# Patient Record
Sex: Female | Born: 1955
Health system: Southern US, Community
[De-identification: ages and names within clinical notes are randomized; demographics above are authoritative.]

## PROBLEM LIST (undated history)

## (undated) DIAGNOSIS — M25511 Pain in right shoulder: Secondary | ICD-10-CM

## (undated) DIAGNOSIS — R233 Spontaneous ecchymoses: Secondary | ICD-10-CM

## (undated) DIAGNOSIS — M7551 Bursitis of right shoulder: Secondary | ICD-10-CM

## (undated) DIAGNOSIS — M549 Dorsalgia, unspecified: Secondary | ICD-10-CM

## (undated) DIAGNOSIS — R635 Abnormal weight gain: Secondary | ICD-10-CM

## (undated) DIAGNOSIS — Z8639 Personal history of other endocrine, nutritional and metabolic disease: Secondary | ICD-10-CM

## (undated) DIAGNOSIS — R001 Bradycardia, unspecified: Secondary | ICD-10-CM

## (undated) DIAGNOSIS — K219 Gastro-esophageal reflux disease without esophagitis: Secondary | ICD-10-CM

## (undated) DIAGNOSIS — F329 Major depressive disorder, single episode, unspecified: Secondary | ICD-10-CM

## (undated) DIAGNOSIS — R5383 Other fatigue: Secondary | ICD-10-CM

## (undated) DIAGNOSIS — R1030 Lower abdominal pain, unspecified: Secondary | ICD-10-CM

## (undated) DIAGNOSIS — F17200 Nicotine dependence, unspecified, uncomplicated: Secondary | ICD-10-CM

## (undated) DIAGNOSIS — D229 Melanocytic nevi, unspecified: Secondary | ICD-10-CM

## (undated) HISTORY — DX: Melanocytic nevi, unspecified: D22.9

## (undated) HISTORY — DX: Other fatigue: R53.83

## (undated) HISTORY — DX: Bursitis of right shoulder: M75.51

## (undated) HISTORY — DX: Bradycardia, unspecified: R00.1

## (undated) HISTORY — DX: Spontaneous ecchymoses: R23.3

## (undated) HISTORY — DX: Gastro-esophageal reflux disease without esophagitis: K21.9

## (undated) HISTORY — DX: Lower abdominal pain, unspecified: R10.30

## (undated) HISTORY — DX: Nicotine dependence, unspecified, uncomplicated: F17.200

## (undated) HISTORY — DX: Dorsalgia, unspecified: M54.9

## (undated) HISTORY — DX: Pain in right shoulder: M25.511

## (undated) HISTORY — DX: Personal history of other endocrine, nutritional and metabolic disease: Z86.39

## (undated) HISTORY — DX: Major depressive disorder, single episode, unspecified: F32.9

## (undated) HISTORY — DX: Abnormal weight gain: R63.5

## (undated) HISTORY — PX: ABDOMINAL HYSTERECTOMY: SHX81

## (undated) HISTORY — PX: CHOLECYSTECTOMY: SHX55

---

## 2000-07-20 ENCOUNTER — Encounter: Payer: Self-pay | Admitting: Family Medicine

## 2000-07-20 ENCOUNTER — Ambulatory Visit (HOSPITAL_COMMUNITY): Admission: RE | Admit: 2000-07-20 | Discharge: 2000-07-20 | Payer: Self-pay | Admitting: Family Medicine

## 2002-08-06 ENCOUNTER — Emergency Department (HOSPITAL_COMMUNITY): Admission: EM | Admit: 2002-08-06 | Discharge: 2002-08-06 | Payer: Self-pay | Admitting: Emergency Medicine

## 2002-08-07 ENCOUNTER — Emergency Department (HOSPITAL_COMMUNITY): Admission: EM | Admit: 2002-08-07 | Discharge: 2002-08-07 | Payer: Self-pay

## 2003-09-11 ENCOUNTER — Encounter: Admission: RE | Admit: 2003-09-11 | Discharge: 2003-09-11 | Payer: Self-pay | Admitting: Obstetrics and Gynecology

## 2003-10-10 ENCOUNTER — Ambulatory Visit (HOSPITAL_COMMUNITY): Admission: RE | Admit: 2003-10-10 | Discharge: 2003-10-10 | Payer: Self-pay | Admitting: Obstetrics and Gynecology

## 2004-07-01 ENCOUNTER — Ambulatory Visit: Payer: Self-pay | Admitting: Obstetrics and Gynecology

## 2004-07-09 ENCOUNTER — Ambulatory Visit: Payer: Self-pay | Admitting: Internal Medicine

## 2004-08-04 ENCOUNTER — Emergency Department (HOSPITAL_COMMUNITY): Admission: EM | Admit: 2004-08-04 | Discharge: 2004-08-04 | Payer: Self-pay | Admitting: Emergency Medicine

## 2004-12-31 ENCOUNTER — Ambulatory Visit: Payer: Self-pay | Admitting: Internal Medicine

## 2004-12-31 ENCOUNTER — Inpatient Hospital Stay (HOSPITAL_COMMUNITY): Admission: AD | Admit: 2004-12-31 | Discharge: 2005-01-02 | Payer: Self-pay | Admitting: Internal Medicine

## 2005-02-22 ENCOUNTER — Ambulatory Visit: Payer: Self-pay | Admitting: Internal Medicine

## 2005-06-09 ENCOUNTER — Ambulatory Visit: Payer: Self-pay | Admitting: Internal Medicine

## 2005-06-27 ENCOUNTER — Ambulatory Visit: Payer: Self-pay | Admitting: Internal Medicine

## 2005-07-07 ENCOUNTER — Ambulatory Visit (HOSPITAL_COMMUNITY): Admission: RE | Admit: 2005-07-07 | Discharge: 2005-07-07 | Payer: Self-pay | Admitting: Internal Medicine

## 2005-07-07 ENCOUNTER — Encounter (INDEPENDENT_AMBULATORY_CARE_PROVIDER_SITE_OTHER): Payer: Self-pay | Admitting: *Deleted

## 2005-07-22 ENCOUNTER — Ambulatory Visit (HOSPITAL_COMMUNITY): Admission: RE | Admit: 2005-07-22 | Discharge: 2005-07-22 | Payer: Self-pay | Admitting: Family Medicine

## 2005-07-22 ENCOUNTER — Encounter (INDEPENDENT_AMBULATORY_CARE_PROVIDER_SITE_OTHER): Payer: Self-pay | Admitting: *Deleted

## 2005-08-08 ENCOUNTER — Ambulatory Visit (HOSPITAL_COMMUNITY): Admission: RE | Admit: 2005-08-08 | Discharge: 2005-08-08 | Payer: Self-pay | Admitting: *Deleted

## 2005-11-24 ENCOUNTER — Ambulatory Visit: Payer: Self-pay | Admitting: Internal Medicine

## 2005-12-01 ENCOUNTER — Encounter (INDEPENDENT_AMBULATORY_CARE_PROVIDER_SITE_OTHER): Payer: Self-pay | Admitting: *Deleted

## 2005-12-01 ENCOUNTER — Ambulatory Visit: Payer: Self-pay | Admitting: Internal Medicine

## 2005-12-08 ENCOUNTER — Ambulatory Visit: Payer: Self-pay | Admitting: Internal Medicine

## 2006-05-15 DIAGNOSIS — F32A Depression, unspecified: Secondary | ICD-10-CM

## 2006-05-15 DIAGNOSIS — Z8639 Personal history of other endocrine, nutritional and metabolic disease: Secondary | ICD-10-CM

## 2006-05-15 DIAGNOSIS — K219 Gastro-esophageal reflux disease without esophagitis: Secondary | ICD-10-CM

## 2006-05-15 DIAGNOSIS — I498 Other specified cardiac arrhythmias: Secondary | ICD-10-CM | POA: Insufficient documentation

## 2006-05-15 DIAGNOSIS — F329 Major depressive disorder, single episode, unspecified: Secondary | ICD-10-CM

## 2006-05-15 DIAGNOSIS — R001 Bradycardia, unspecified: Secondary | ICD-10-CM

## 2006-05-15 DIAGNOSIS — E049 Nontoxic goiter, unspecified: Secondary | ICD-10-CM | POA: Insufficient documentation

## 2006-05-15 HISTORY — DX: Bradycardia, unspecified: R00.1

## 2006-05-15 HISTORY — DX: Depression, unspecified: F32.A

## 2006-05-15 HISTORY — DX: Gastro-esophageal reflux disease without esophagitis: K21.9

## 2006-05-15 HISTORY — DX: Personal history of other endocrine, nutritional and metabolic disease: Z86.39

## 2006-05-18 ENCOUNTER — Encounter (INDEPENDENT_AMBULATORY_CARE_PROVIDER_SITE_OTHER): Payer: Self-pay | Admitting: *Deleted

## 2006-06-06 ENCOUNTER — Ambulatory Visit: Payer: Self-pay | Admitting: Internal Medicine

## 2006-06-06 ENCOUNTER — Encounter (INDEPENDENT_AMBULATORY_CARE_PROVIDER_SITE_OTHER): Payer: Self-pay | Admitting: *Deleted

## 2006-06-06 LAB — CONVERTED CEMR LAB
ALT: 9 units/L (ref 0–35)
AST: 13 units/L (ref 0–37)
Albumin: 4 g/dL (ref 3.5–5.2)
BUN: 4 mg/dL — ABNORMAL LOW (ref 6–23)
Calcium: 8.5 mg/dL (ref 8.4–10.5)
Cholesterol: 171 mg/dL (ref 0–200)
Glucose, Bld: 81 mg/dL (ref 70–99)
TSH: 1.268 microintl units/mL (ref 0.350–5.50)
Total Bilirubin: 0.5 mg/dL (ref 0.3–1.2)
Total Protein: 6.9 g/dL (ref 6.0–8.3)
Triglycerides: 155 mg/dL — ABNORMAL HIGH (ref <150)

## 2006-09-06 ENCOUNTER — Encounter (INDEPENDENT_AMBULATORY_CARE_PROVIDER_SITE_OTHER): Payer: Self-pay | Admitting: *Deleted

## 2006-09-06 ENCOUNTER — Ambulatory Visit: Payer: Self-pay | Admitting: Internal Medicine

## 2006-09-13 ENCOUNTER — Ambulatory Visit (HOSPITAL_COMMUNITY): Admission: RE | Admit: 2006-09-13 | Discharge: 2006-09-13 | Payer: Self-pay | Admitting: Family Medicine

## 2006-09-13 ENCOUNTER — Encounter: Payer: Self-pay | Admitting: Internal Medicine

## 2006-10-27 ENCOUNTER — Encounter (INDEPENDENT_AMBULATORY_CARE_PROVIDER_SITE_OTHER): Payer: Self-pay | Admitting: *Deleted

## 2006-11-29 ENCOUNTER — Encounter (INDEPENDENT_AMBULATORY_CARE_PROVIDER_SITE_OTHER): Payer: Self-pay | Admitting: *Deleted

## 2006-12-06 ENCOUNTER — Ambulatory Visit: Payer: Self-pay | Admitting: Internal Medicine

## 2006-12-06 ENCOUNTER — Telehealth (INDEPENDENT_AMBULATORY_CARE_PROVIDER_SITE_OTHER): Payer: Self-pay | Admitting: *Deleted

## 2006-12-15 ENCOUNTER — Encounter: Admission: RE | Admit: 2006-12-15 | Discharge: 2007-01-09 | Payer: Self-pay | Admitting: *Deleted

## 2006-12-29 ENCOUNTER — Encounter (INDEPENDENT_AMBULATORY_CARE_PROVIDER_SITE_OTHER): Payer: Self-pay | Admitting: *Deleted

## 2007-06-06 ENCOUNTER — Ambulatory Visit: Payer: Self-pay | Admitting: Internal Medicine

## 2007-06-08 ENCOUNTER — Emergency Department (HOSPITAL_COMMUNITY): Admission: EM | Admit: 2007-06-08 | Discharge: 2007-06-08 | Payer: Self-pay | Admitting: Emergency Medicine

## 2007-06-14 ENCOUNTER — Ambulatory Visit: Payer: Self-pay | Admitting: Internal Medicine

## 2007-08-23 ENCOUNTER — Ambulatory Visit: Payer: Self-pay | Admitting: Infectious Disease

## 2007-08-23 ENCOUNTER — Encounter (INDEPENDENT_AMBULATORY_CARE_PROVIDER_SITE_OTHER): Payer: Self-pay | Admitting: Internal Medicine

## 2007-08-24 LAB — CONVERTED CEMR LAB
Albumin: 3.9 g/dL (ref 3.5–5.2)
CO2: 29 meq/L (ref 19–32)
Calcium: 9.1 mg/dL (ref 8.4–10.5)
Cholesterol: 304 mg/dL — ABNORMAL HIGH (ref 0–200)
Creatinine, Ser: 0.77 mg/dL (ref 0.40–1.20)
HDL: 53 mg/dL (ref >39)
LDL Cholesterol: 218 mg/dL — ABNORMAL HIGH (ref 0–99)
Sodium: 142 meq/L (ref 135–145)
TSH: 1.448 microintl units/mL (ref 0.350–5.50)
Total CHOL/HDL Ratio: 5.7
Triglycerides: 166 mg/dL — ABNORMAL HIGH (ref <150)
VLDL: 33 mg/dL (ref 0–40)

## 2007-08-28 ENCOUNTER — Telehealth: Payer: Self-pay | Admitting: *Deleted

## 2008-03-26 ENCOUNTER — Ambulatory Visit: Payer: Self-pay | Admitting: Internal Medicine

## 2008-03-26 ENCOUNTER — Encounter (INDEPENDENT_AMBULATORY_CARE_PROVIDER_SITE_OTHER): Payer: Self-pay | Admitting: Internal Medicine

## 2008-05-09 ENCOUNTER — Ambulatory Visit: Payer: Self-pay | Admitting: Internal Medicine

## 2008-05-13 ENCOUNTER — Emergency Department (HOSPITAL_COMMUNITY): Admission: EM | Admit: 2008-05-13 | Discharge: 2008-05-13 | Payer: Self-pay | Admitting: Emergency Medicine

## 2008-05-14 ENCOUNTER — Telehealth (INDEPENDENT_AMBULATORY_CARE_PROVIDER_SITE_OTHER): Payer: Self-pay | Admitting: Pharmacy Technician

## 2008-05-15 ENCOUNTER — Encounter: Admission: RE | Admit: 2008-05-15 | Discharge: 2008-05-15 | Payer: Self-pay | Admitting: Chiropractic Medicine

## 2008-08-11 ENCOUNTER — Encounter (INDEPENDENT_AMBULATORY_CARE_PROVIDER_SITE_OTHER): Payer: Self-pay | Admitting: Internal Medicine

## 2008-08-11 ENCOUNTER — Ambulatory Visit: Payer: Self-pay | Admitting: Internal Medicine

## 2008-08-13 LAB — CONVERTED CEMR LAB
ALT: 9 units/L (ref 0–35)
AST: 11 units/L (ref 0–37)
Albumin: 3.7 g/dL (ref 3.5–5.2)
BUN: 9 mg/dL (ref 6–23)
CO2: 23 meq/L (ref 19–32)
Calcium: 8.6 mg/dL (ref 8.4–10.5)
Chloride: 108 meq/L (ref 96–112)
Creatinine, Ser: 0.91 mg/dL (ref 0.40–1.20)
HDL: 57 mg/dL (ref >39)
Hemoglobin: 13.3 g/dL (ref 12.0–15.0)
MCHC: 32.8 g/dL (ref 30.0–36.0)
Potassium: 3.8 meq/L (ref 3.5–5.3)
RBC: 4.33 M/uL (ref 3.87–5.11)
Total Protein: 6.3 g/dL (ref 6.0–8.3)
WBC: 3.8 10*3/uL — ABNORMAL LOW (ref 4.0–10.5)

## 2008-08-15 ENCOUNTER — Encounter (INDEPENDENT_AMBULATORY_CARE_PROVIDER_SITE_OTHER): Payer: Self-pay | Admitting: *Deleted

## 2008-10-08 ENCOUNTER — Telehealth (INDEPENDENT_AMBULATORY_CARE_PROVIDER_SITE_OTHER): Payer: Self-pay | Admitting: Internal Medicine

## 2009-04-20 ENCOUNTER — Ambulatory Visit: Payer: Self-pay | Admitting: Infectious Diseases

## 2009-07-07 ENCOUNTER — Ambulatory Visit: Payer: Self-pay | Admitting: Internal Medicine

## 2009-07-07 DIAGNOSIS — D239 Other benign neoplasm of skin, unspecified: Secondary | ICD-10-CM | POA: Insufficient documentation

## 2009-07-07 DIAGNOSIS — M549 Dorsalgia, unspecified: Secondary | ICD-10-CM

## 2009-07-07 DIAGNOSIS — M25519 Pain in unspecified shoulder: Secondary | ICD-10-CM

## 2009-07-07 DIAGNOSIS — D229 Melanocytic nevi, unspecified: Secondary | ICD-10-CM

## 2009-07-07 DIAGNOSIS — M25511 Pain in right shoulder: Secondary | ICD-10-CM

## 2009-07-07 HISTORY — DX: Melanocytic nevi, unspecified: D22.9

## 2009-07-07 HISTORY — DX: Pain in right shoulder: M25.511

## 2009-07-07 HISTORY — DX: Dorsalgia, unspecified: M54.9

## 2009-07-27 ENCOUNTER — Telehealth: Payer: Self-pay | Admitting: Internal Medicine

## 2009-08-17 ENCOUNTER — Telehealth: Payer: Self-pay | Admitting: Internal Medicine

## 2009-10-19 ENCOUNTER — Encounter: Payer: Self-pay | Admitting: Internal Medicine

## 2009-11-17 IMAGING — CR DG WRIST COMPLETE 3+V*R*
4 series · 4 of 4 positions shown · non-contrast
Comparison: None.

CLINICAL DATA: Right thumb pain, right wrist pain

RIGHT WRIST - COMPLETE 3+ VIEW

[x wrist pa right]
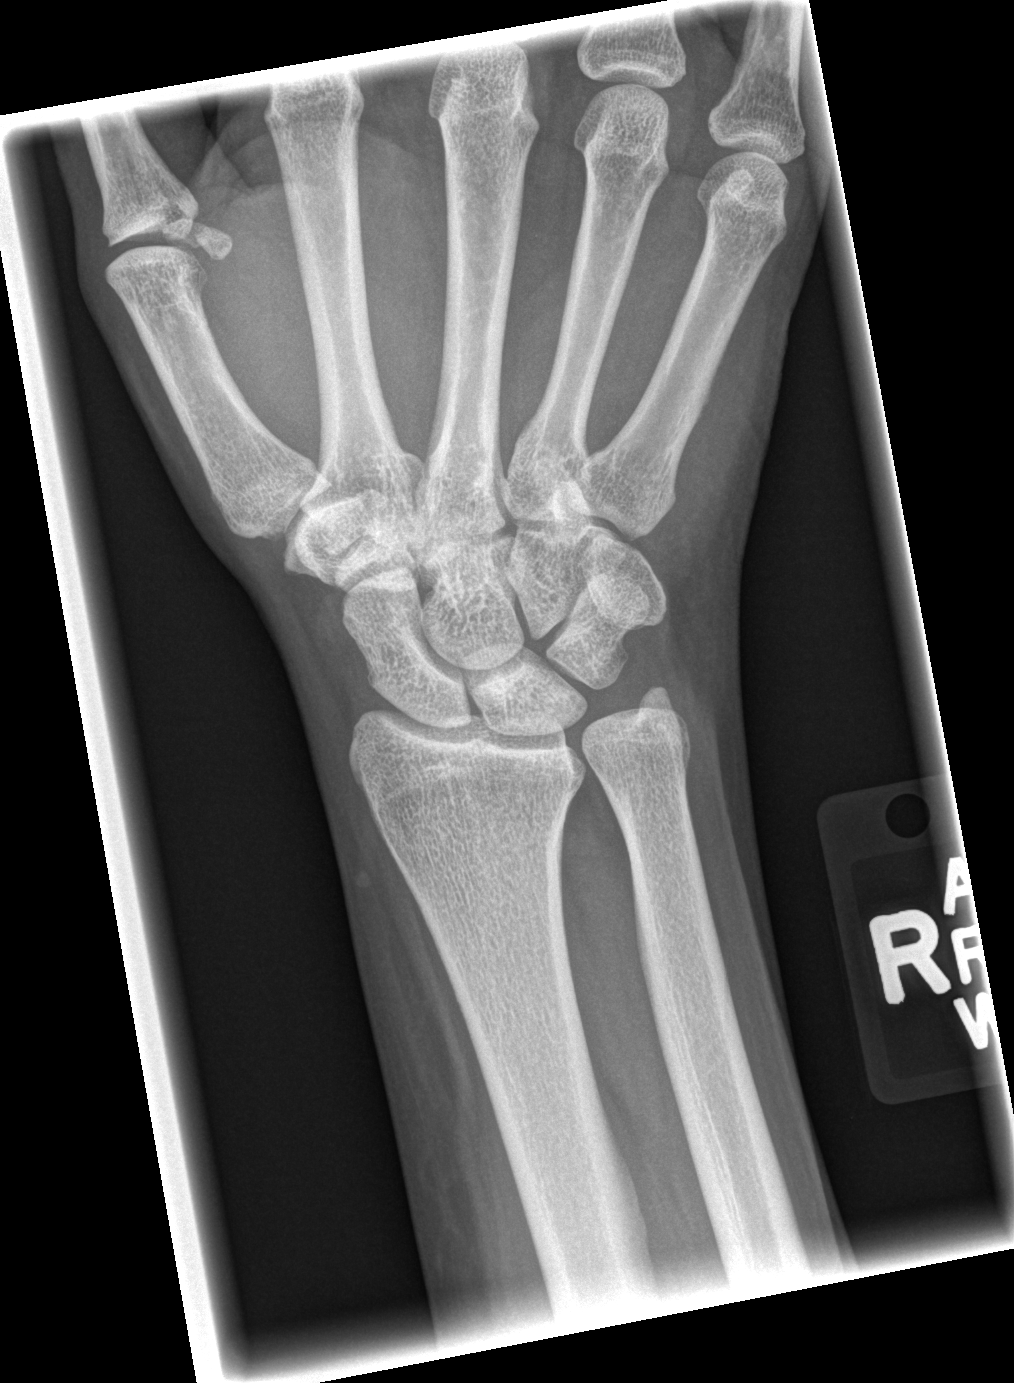

[x wrist obl right]
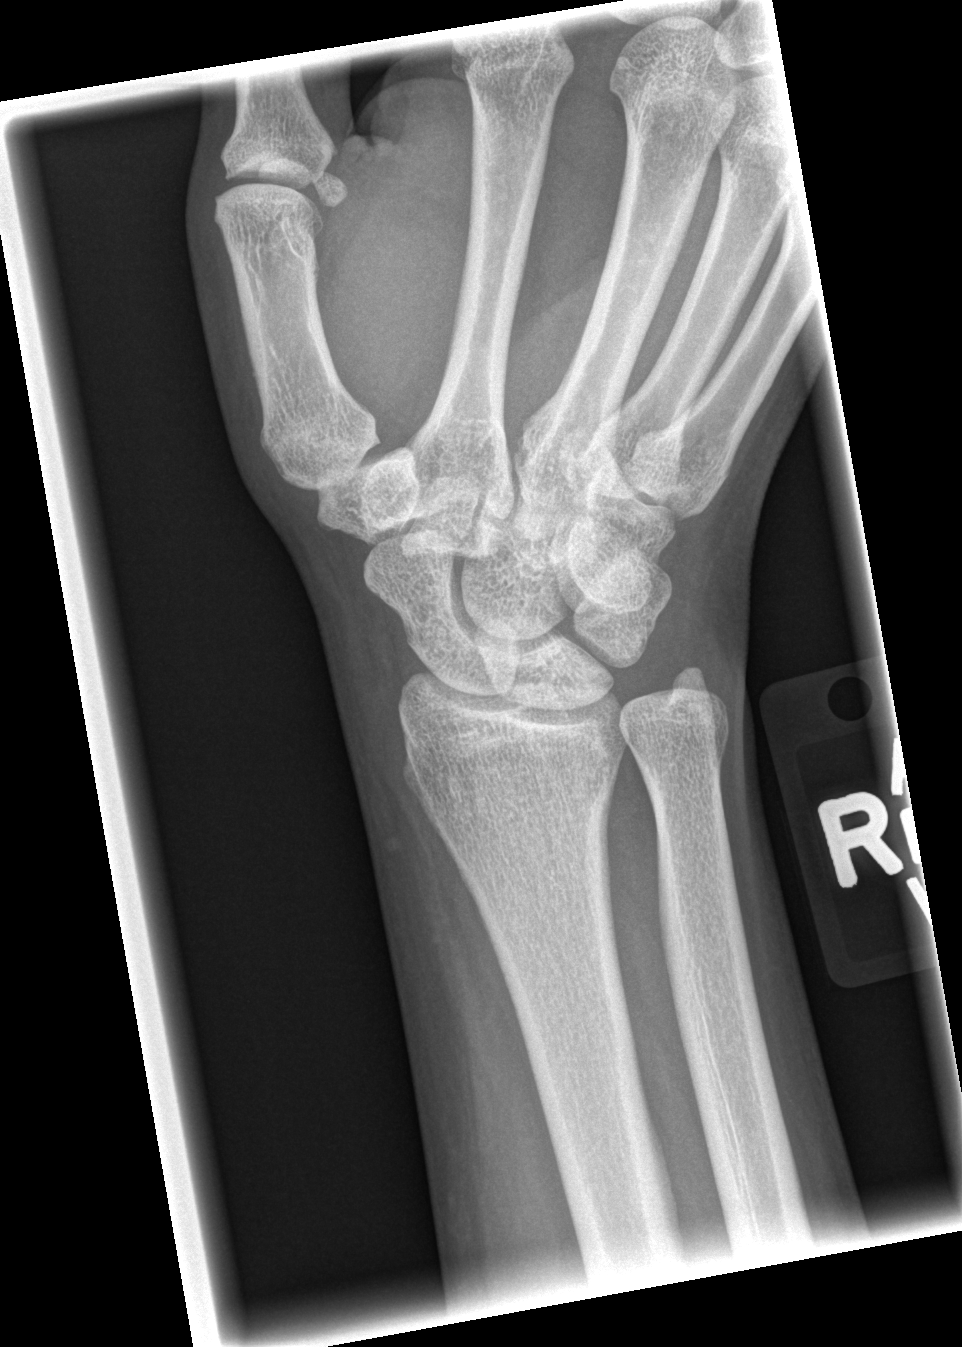

[x wrist lat right]
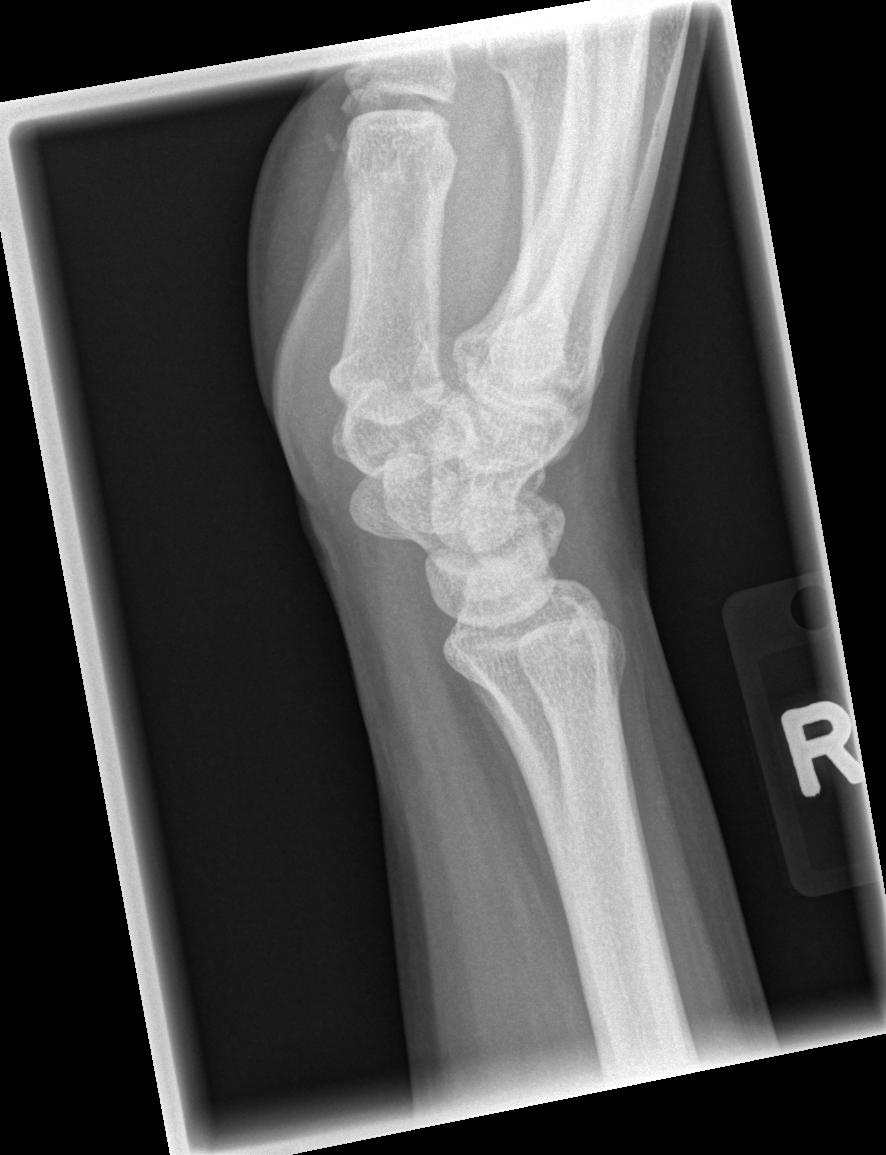

[x wrist navicular]
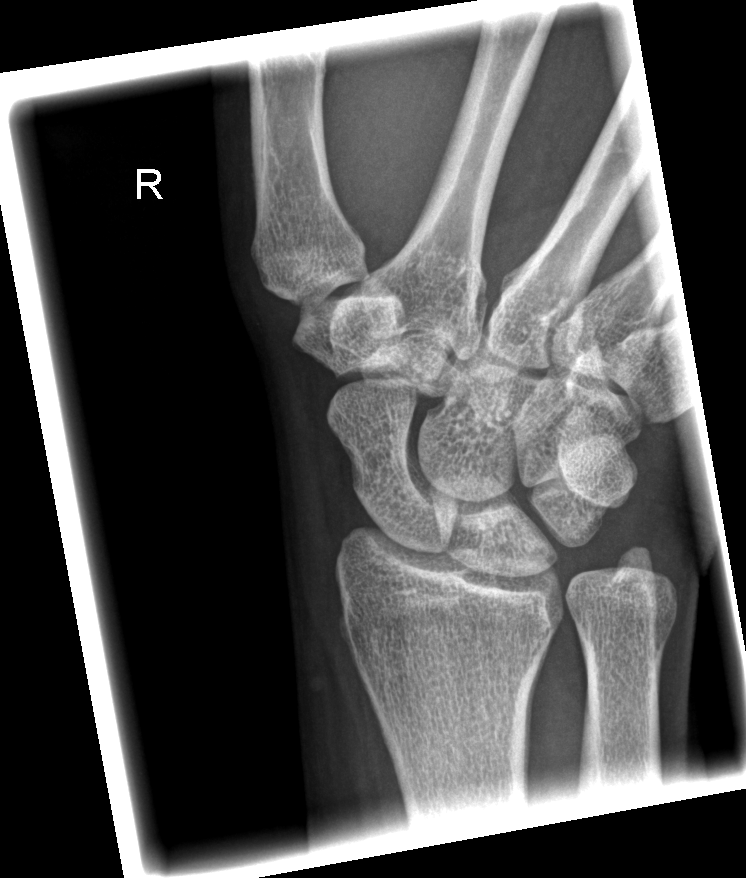

[4 of 4 positions shown; findings below may reference images not displayed]

FINDINGS: There is no evidence of fracture or dislocation.  There
is no evidence of arthropathy or other focal bony abnormality.
Soft tissues are unremarkable.
IMPRESSION: Negative

## 2010-03-23 ENCOUNTER — Ambulatory Visit: Payer: Self-pay | Admitting: Internal Medicine

## 2010-03-23 DIAGNOSIS — R109 Unspecified abdominal pain: Secondary | ICD-10-CM

## 2010-03-23 DIAGNOSIS — R1030 Lower abdominal pain, unspecified: Secondary | ICD-10-CM

## 2010-03-23 HISTORY — DX: Lower abdominal pain, unspecified: R10.30

## 2010-03-23 LAB — CONVERTED CEMR LAB
Blood in Urine, dipstick: NEGATIVE
Nitrite: NEGATIVE
Protein, U semiquant: NEGATIVE
Urobilinogen, UA: 0.2
pH: 6.5

## 2010-04-05 ENCOUNTER — Ambulatory Visit (HOSPITAL_COMMUNITY): Admission: RE | Admit: 2010-04-05 | Discharge: 2010-04-05 | Payer: Self-pay | Admitting: Internal Medicine

## 2010-05-10 ENCOUNTER — Ambulatory Visit: Payer: Self-pay | Admitting: Internal Medicine

## 2010-05-10 DIAGNOSIS — F17200 Nicotine dependence, unspecified, uncomplicated: Secondary | ICD-10-CM

## 2010-05-10 DIAGNOSIS — M67919 Unspecified disorder of synovium and tendon, unspecified shoulder: Secondary | ICD-10-CM | POA: Insufficient documentation

## 2010-05-10 DIAGNOSIS — M7551 Bursitis of right shoulder: Secondary | ICD-10-CM

## 2010-05-10 DIAGNOSIS — M719 Bursopathy, unspecified: Secondary | ICD-10-CM

## 2010-05-10 HISTORY — DX: Bursitis of right shoulder: M75.51

## 2010-05-10 HISTORY — DX: Nicotine dependence, unspecified, uncomplicated: F17.200

## 2010-08-12 ENCOUNTER — Ambulatory Visit: Admission: RE | Admit: 2010-08-12 | Discharge: 2010-08-12 | Payer: Self-pay | Source: Home / Self Care

## 2010-08-12 DIAGNOSIS — R5383 Other fatigue: Secondary | ICD-10-CM

## 2010-08-12 DIAGNOSIS — R635 Abnormal weight gain: Secondary | ICD-10-CM

## 2010-08-12 DIAGNOSIS — R5381 Other malaise: Secondary | ICD-10-CM | POA: Insufficient documentation

## 2010-08-12 DIAGNOSIS — R233 Spontaneous ecchymoses: Secondary | ICD-10-CM

## 2010-08-12 HISTORY — DX: Other fatigue: R53.83

## 2010-08-12 HISTORY — DX: Abnormal weight gain: R63.5

## 2010-08-12 HISTORY — DX: Spontaneous ecchymoses: R23.3

## 2010-08-12 LAB — CONVERTED CEMR LAB
ALT: 9 units/L (ref 0–35)
Albumin: 4.2 g/dL (ref 3.5–5.2)
Alkaline Phosphatase: 69 units/L (ref 39–117)
Basophils Absolute: 0 10*3/uL (ref 0.0–0.1)
Basophils Relative: 0 % (ref 0–1)
Calcium: 9.3 mg/dL (ref 8.4–10.5)
Creatinine, Ser: 0.83 mg/dL (ref 0.40–1.20)
Glucose, Bld: 103 mg/dL — ABNORMAL HIGH (ref 70–99)
HCT: 44.5 % (ref 36.0–46.0)
Hemoglobin: 14.3 g/dL (ref 12.0–15.0)
Lymphocytes Relative: 42 % (ref 12–46)
Lymphs Abs: 1.6 10*3/uL (ref 0.7–4.0)
MCHC: 32.1 g/dL (ref 30.0–36.0)
MCV: 96.1 fL (ref 78.0–100.0)
Monocytes Absolute: 0.4 10*3/uL (ref 0.1–1.0)
Monocytes Relative: 10 % (ref 3–12)
RDW: 14.4 % (ref 11.5–15.5)
Sodium: 140 meq/L (ref 135–145)
TSH: 0.808 microintl units/mL (ref 0.350–4.50)
Total Bilirubin: 0.5 mg/dL (ref 0.3–1.2)
Total Protein: 6.8 g/dL (ref 6.0–8.3)
WBC: 3.8 10*3/uL — ABNORMAL LOW (ref 4.0–10.5)

## 2010-08-14 ENCOUNTER — Encounter: Payer: Self-pay | Admitting: Obstetrics and Gynecology

## 2010-08-15 ENCOUNTER — Encounter: Payer: Self-pay | Admitting: *Deleted

## 2010-08-16 ENCOUNTER — Encounter: Payer: Self-pay | Admitting: Internal Medicine

## 2010-08-24 NOTE — Assessment & Plan Note (Signed)
Summary: BACK PAIN.CFB   Vital Signs:  Patient profile:   55 year old female Height:      67 inches (170.18 cm) Weight:      189.5 pounds (86.14 kg) BMI:     29.79 Temp:     97.0 degrees F (36.11 degrees C) oral Pulse rate:   48 / minute BP sitting:   116 / 77  (left arm)  Vitals Entered By: Stanton Kidney Ditzler RN (March 23, 2010 9:51 AM) CC: Right Groin pain Is Patient Diabetic? No Pain Assessment Patient in pain? yes     Location: right abd Intensity: 9 Type: sharp Onset of pain  past 3 days Nutritional Status BMI of 25 - 29 = overweight  Have you ever been in a relationship where you felt threatened, hurt or afraid?denies   Does patient need assistance? Functional Status Self care Ambulation Normal Comments Past 3 days - right abd pain - sl constipation. LMP hyst bil s&o. Back and shoulder pain cont. Refills on meds.   Primary Care Provider:  Judie Grieve MD  CC:  Right Groin pain.  History of Present Illness: 55 yo f with PMH in EMR, presents to [facility] for:  Groin pain on right: the pain started 3 days sgo , located at right ingunal area ,is about 9/10 intesity, pressure in quality, radiates no where,  it has been getting worse over the past 3 days, it is brought on by nothing,   it usually lasts for few hours,It comes and goes, occurs about 2-3 times a day, it is alleviated by walking, meds, aggravated by movement, associated with nothing,Denies SOB, Denies CP, dysuria.  No recent trauma, strain.  Back pain: stable, would like refill on pain meds.   smoking, pt refuses to quit smoking at this point.   Patient is feeling well and denies CP, abdominal pain, nausea, vomiting, HA's, palpitations, blurred vision. fever, chills, diarrhea, constipation or SOB. last BM was this morning.      Depression History:      The patient denies a depressed mood most of the day and a diminished interest in her usual daily activities.         Preventive Screening-Counseling &  Management  Alcohol-Tobacco     Smoking Status: current     Smoking Cessation Counseling: yes     Packs/Day: 1 cig per week     Year Quit: 4-5 monhs ago  Caffeine-Diet-Exercise     Does Patient Exercise: no  Current Medications (verified): 1)  Prozac 20 Mg Caps (Fluoxetine Hcl) .... Take One Capsule By Mouth  One Time Per Day 2)  Omeprazole 20 Mg Cpdr (Omeprazole) .... Take 1 Tablet Twice A Day. 3)  Multivitamins Tabs (Multiple Vitamin) .... Take One Tablet By Mouth Once Daily. 4)  Calcium-Vitamin D 500-200 Mg-Unit Tabs (Calcium Carbonate-Vitamin D) .... Take 1 Tablet By Mouth Three Times A Day 5)  Trazodone Hcl 100 Mg Tabs (Trazodone Hcl) .... Take 1 Tablet By Mouth Once A  Per Dr. Marney Setting, [facility] 6)  Tramadol Hcl 50 Mg Tabs (Tramadol Hcl) .... Take 1 Tablet By Mouth Every 8 Hour For Pain As Needed. 7)  Miralax  Powd (Polyethylene Glycol 3350) .... Mix One Pack (17g) With A Glass of Water and Drink Once Daily.  Allergies: 1)  ! Aspirin (Aspirin)  Social History: Smoking Status:  current Packs/Day:  1 cig per week  Review of Systems       as per HPI  Physical Exam  General:  alert, well-developed, and well-nourished.   Lungs:  normal respiratory effort, no intercostal retractions, no accessory muscle use, and normal breath sounds.   Heart:  normal rate and regular rhythm.   Abdomen:  soft,mildly TTP on RLQ, normal bowel sounds, and no distention.  no rebound. no masses to suggest hernia.  Genitalia:  no dysuria Msk:  normal ROM, no joint tenderness, no joint swelling, no joint warmth, no redness over joints, and no joint deformities.   Lumbar spine: pain on palpation spine process and paraspinal muscle. pain on extension and flexion lumbar spine.  Extremities:  no edema   Impression & Recommendations:  Problem # 1:  INGUINAL PAIN, RIGHT (ICD-789.09) Assessment New no mass palpated to suggest indirect hernia, we got UA to r/o infection/stone. which was negative.     last colonoscpy 1 yr ago, which was bening, pt has noted that recently her stool has been hard, Give abdominal exam, unlikely to be appendicitis, no signs of hernia. will try mirialax as this may be simply constipation.   Her updated medication list for this problem includes:    Tramadol Hcl 50 Mg Tabs (Tramadol hcl) .Marland Kitchen... Take 1 tablet by mouth every 8 hour for pain as needed.  Orders: T-Urinalysis Dipstick only ([address])  Problem # 2:  GERD (ICD-530.81) Assessment: Comment Only stable, would like refill on PPI.   Her updated medication list for this problem includes:    Omeprazole 20 Mg Cpdr (Omeprazole) .Marland Kitchen... Take 1 tablet twice a day.  Problem # 3:  BACK PAIN (ICD-724.5) Assessment: Comment Only stable, would like refill on pain med.   Her updated medication list for this problem includes:    Tramadol Hcl 50 Mg Tabs (Tramadol hcl) .Marland Kitchen... Take 1 tablet by mouth every 8 hour for pain as needed.  Complete Medication List: 1)  Prozac 20 Mg Caps (Fluoxetine hcl) .... Take one capsule by mouth  one time per day 2)  Omeprazole 20 Mg Cpdr (Omeprazole) .... Take 1 tablet twice a day. 3)  Multivitamins Tabs (Multiple vitamin) .... Take one tablet by mouth once daily. 4)  Calcium-vitamin D 500-200 Mg-unit Tabs (Calcium carbonate-vitamin d) .... Take 1 tablet by mouth three times a day 5)  Trazodone Hcl 100 Mg Tabs (Trazodone hcl) .... Take 1 tablet by mouth once a  per dr. Marney Setting, mental heath 6)  Tramadol Hcl 50 Mg Tabs (Tramadol hcl) .... Take 1 tablet by mouth every 8 hour for pain as needed. 7)  Miralax Powd (Polyethylene glycol 3350) .... Mix one pack (17g) with a glass of water and drink once daily.  Other Orders: Influenza Vaccine NON MCR (45409) Mammogram (Screening) (Mammo)  Patient Instructions: 1)  Please schedule a follow-up appointment in 1 month. Prescriptions: TRAMADOL HCL 50 MG TABS (TRAMADOL HCL) Take 1 tablet by mouth every 8 hour for pain as needed.  #90 Tablet x  0   Entered and Authorized by:   Darnelle Maffucci MD   Signed by:   Darnelle Maffucci MD on 03/23/2010   Method used:   Print then Give to Patient   RxID:   [id number] OMEPRAZOLE 20 MG CPDR (OMEPRAZOLE) Take 1 tablet twice a day.  #60 x 5   Entered and Authorized by:   Darnelle Maffucci MD   Signed by:   Darnelle Maffucci MD on 03/23/2010   Method used:   Print then Give to Patient   RxID:   [id number] MIRALAX  POWD (POLYETHYLENE GLYCOL 3350) mix one pack (17g)  with a glass of water and drink once daily.  #30 x 0   Entered and Authorized by:   Darnelle Maffucci MD   Signed by:   Darnelle Maffucci MD on 03/23/2010   Method used:   Print then Give to Patient   RxID:   [id number] TRAMADOL HCL 50 MG TABS (TRAMADOL HCL) Take 1 tablet by mouth every 8 hour for pain as needed.  #90 Tablet x 0   Entered and Authorized by:   Darnelle Maffucci MD   Signed by:   Darnelle Maffucci MD on 03/23/2010   Method used:   Print then Give to Patient   RxID:   [id number] OMEPRAZOLE 20 MG CPDR (OMEPRAZOLE) Take 1 tablet twice a day.  #60 x 5   Entered and Authorized by:   Darnelle Maffucci MD   Signed by:   Darnelle Maffucci MD on 03/23/2010   Method used:   Print then Give to Patient   RxID:   [id number]    Prevention & Chronic Care Immunizations   Influenza vaccine: Fluvax Non-MCR  (03/23/2010)    Tetanus booster: Not documented    Pneumococcal vaccine: Not documented  Colorectal Screening   Hemoccult: Not documented   Hemoccult action/deferral: Deferred  (03/23/2010)    Colonoscopy: Not documented   Colonoscopy action/deferral: Deferred  (03/23/2010)  Other Screening   Pap smear: Not documented   Pap smear action/deferral: Not indicated S/P hysterectomy  (03/23/2010)    Mammogram: Not documented   Mammogram action/deferral: Ordered  (03/23/2010)   Smoking status: current  (03/23/2010)   Smoking cessation counseling: yes  (03/23/2010)  Lipids   Total Cholesterol: 226   (08/11/2008)   LDL: 128  (08/11/2008)   LDL Direct: Not documented   HDL: 57  (08/11/2008)   Triglycerides: 207  (08/11/2008)      Resource handout printed.   Nursing Instructions: Schedule screening mammogram (see order)     Influenza Vaccine    Vaccine Type: Fluvax Non-MCR    Site: left deltoid    Mfr: GlaxoSmithKline    Dose: 0.5 ml    Route: IM    Given by: Stanton Kidney Ditzler RN    Exp. Date: 01/22/2011    Lot #: [id number]    VIS given: 02/15/07 version given March 23, 2010.  Flu Vaccine Consent Questions    Do you have a history of severe allergic reactions to this vaccine? no    Any prior history of allergic reactions to egg and/or gelatin? no    Do you have a sensitivity to the preservative Thimersol? no    Do you have a past history of Guillan-Barre Syndrome? no    Do you currently have an acute febrile illness? no    Have you ever had a severe reaction to latex? no    Vaccine information given and explained to patient? yes    Are you currently pregnant? no  Laboratory Results   Urine Tests  Date/Time Received: 03/23/10 10:34AM Date/Time Reported: same  Routine Urinalysis   Color: yellow Appearance: Clear Glucose: negative   (Normal Range: Negative) Bilirubin: negative   (Normal Range: Negative) Ketone: negative   (Normal Range: Negative) Spec. Gravity: 1.010   (Normal Range: 1.003-1.035) Blood: negative   (Normal Range: Negative) pH: 6.5   (Normal Range: 5.0-8.0) Protein: negative   (Normal Range: Negative) Urobilinogen: 0.2   (Normal Range: 0-1) Nitrite: negative   (Normal Range: Negative) Leukocyte Esterace: negative   (Normal Range: Negative)

## 2010-08-24 NOTE — Progress Notes (Signed)
Summary: prior authorization/gp  Phone Note Outgoing Call   Summary of Call: Omeprazole 40mg  daily  needs Prior Authorization but if you change it to 20mg  two times a day, this is covered  under her plan.  Thanks Initial call taken by: Chinita Pester RN,  July 27, 2009 4:08 PM    New/Updated Medications: OMEPRAZOLE 20 MG CPDR (OMEPRAZOLE) Take 1 tablet twice a day. Prescriptions: OMEPRAZOLE 20 MG CPDR (OMEPRAZOLE) Take 1 tablet twice a day.  #60 x 5   Entered and Authorized by:   Hartley Barefoot MD   Signed by:   Hartley Barefoot MD on 07/27/2009   Method used:   Electronically to        [facility]* (retail)       [address] Felicita Gage [address] [place], [place]  [address]       Ph: [phone] or [phone]       Fax: [phone]   RxID:   [id number]   Appended Document: prior authorization/gp Pt. called and made awared of Omperazole changed to 20mg  two times a day. New Rx called to [facility] pharmacy on [address].

## 2010-08-24 NOTE — Assessment & Plan Note (Signed)
Summary: FU/SB.   Vital Signs:  Patient profile:   55 year old female Height:      67 inches (170.18 cm) Weight:      194.2 pounds (88.27 kg) BMI:     30.53 Temp:     97.8 degrees F (36.56 degrees C) oral Pulse rate:   56 / minute BP sitting:   108 / 70  (left arm)  Vitals Entered By: Stanton Kidney Ditzler RN (May 10, 2010 4:09 PM) CC: right arm pain Is Patient Diabetic? No Pain Assessment Patient in pain? yes     Location: right arm Intensity: 6-8 Type: sharp Onset of pain  past week Nutritional Status BMI of > 30 = obese Nutritional Status Detail appetite good  Have you ever been in a relationship where you felt threatened, hurt or afraid?denies   Does patient need assistance? Functional Status Self care Ambulation Normal Comments FU and ck right arm - sharp pains and swelling past week.   Primary Care Provider:  Judie Grieve MD  CC:  right arm pain.  History of Present Illness: 55 yo f with PMH in EMR, presents to [facility] for:  right shoulder pain ongoing for past 2 weeks, and is not improving, is associated with limited ROM, and is described as dull pain 6/10 in intensity.   Back pain: stable, would like refill on pain meds.   smoking, pt refuses to quit smoking at this point.   Patient is feeling well and denies CP, abdominal pain, nausea, vomiting, HA's, palpitations, blurred vision. fever, chills, diarrhea, constipation or SOB. last BM was this morning.      Depression History:      The patient denies a depressed mood most of the day and a diminished interest in her usual daily activities.         Preventive Screening-Counseling & Management  Alcohol-Tobacco     Smoking Status: current     Smoking Cessation Counseling: yes     Packs/Day: 1 cig per week     Year Quit: 4-5 monhs ago  Caffeine-Diet-Exercise     Does Patient Exercise: no  Current Medications (verified): 1)  Prozac 20 Mg Caps (Fluoxetine Hcl) .... Take One Capsule By Mouth  One Time  Per Day 2)  Omeprazole 20 Mg Cpdr (Omeprazole) .... Take 1 Tablet Twice A Day. 3)  Multivitamins Tabs (Multiple Vitamin) .... Take One Tablet By Mouth Once Daily. 4)  Calcium-Vitamin D 500-200 Mg-Unit Tabs (Calcium Carbonate-Vitamin D) .... Take 1 Tablet By Mouth Three Times A Day 5)  Trazodone Hcl 100 Mg Tabs (Trazodone Hcl) .... Take 1 Tablet By Mouth Once A  Per Dr. Marney Setting, [facility] 6)  Tramadol Hcl 50 Mg Tabs (Tramadol Hcl) .... Take 1 Tablet By Mouth Every 8 Hour For Pain As Needed. 7)  Miralax  Powd (Polyethylene Glycol 3350) .... Mix One Pack (17g) With A Glass of Water and Drink Once Daily. 8)  Diclofenac Sodium 75 Mg Tbec (Diclofenac Sodium) .... Take 1 Tablet By Mouth Two Times A Day As Needed For Pain.  Allergies: 1)  ! Aspirin (Aspirin)  Review of Systems       as per HPI  Physical Exam  General:  alert, well-developed, and well-nourished.   Lungs:  normal respiratory effort, no intercostal retractions, no accessory muscle use, and normal breath sounds.   Heart:  normal rate and regular rhythm.   Abdomen:  soft, non-tender, normal bowel sounds, no distention, no guarding, no rebound tenderness, no hepatomegaly, and  no splenomegaly.    Msk:  right shoulder pain with limited ROM   Impression & Recommendations:  Problem # 1:  BURSITIS, RIGHT SHOULDER (ICD-726.10) Assessment New ongoing for 2 weeks, now improving, will give diclfenac for pain, if symptoms persist will consider joint injection.   Problem # 2:  BACK PAIN (ICD-724.5) Assessment: Comment Only stable   Her updated medication list for this problem includes:    Tramadol Hcl 50 Mg Tabs (Tramadol hcl) .Marland Kitchen... Take 1 tablet by mouth every 8 hour for pain as needed.    Diclofenac Sodium 75 Mg Tbec (Diclofenac sodium) .Marland Kitchen... Take 1 tablet by mouth two times a day as needed for pain.  Problem # 3:  SMOKER (ICD-305.1) Assessment: Comment Only Patient was counseled on smoking cessation strategies including  medications and behavior modification options. Patient said she was not ready to stop smoking at this time.    Complete Medication List: 1)  Prozac 20 Mg Caps (Fluoxetine hcl) .... Take one capsule by mouth  one time per day 2)  Omeprazole 20 Mg Cpdr (Omeprazole) .... Take 1 tablet twice a day. 3)  Multivitamins Tabs (Multiple vitamin) .... Take one tablet by mouth once daily. 4)  Calcium-vitamin D 500-200 Mg-unit Tabs (Calcium carbonate-vitamin d) .... Take 1 tablet by mouth three times a day 5)  Trazodone Hcl 100 Mg Tabs (Trazodone hcl) .... Take 1 tablet by mouth once a  per dr. Marney Setting, mental heath 6)  Tramadol Hcl 50 Mg Tabs (Tramadol hcl) .... Take 1 tablet by mouth every 8 hour for pain as needed. 7)  Miralax Powd (Polyethylene glycol 3350) .... Mix one pack (17g) with a glass of water and drink once daily. 8)  Diclofenac Sodium 75 Mg Tbec (Diclofenac sodium) .... Take 1 tablet by mouth two times a day as needed for pain.  Patient Instructions: 1)  Please schedule a follow-up appointment in 6 months. Prescriptions: DICLOFENAC SODIUM 75 MG TBEC (DICLOFENAC SODIUM) Take 1 tablet by mouth two times a day as needed for pain.  #60 x 0   Entered and Authorized by:   Darnelle Maffucci MD   Signed by:   Darnelle Maffucci MD on 05/10/2010   Method used:   Electronically to        [facility] (retail)       [address]       [place], [place]  [id number]       Ph: [phone]       Fax: [phone]   RxID:   [id number]    Orders Added: 1)  Est. Patient Level IV [[address]]    Prevention & Chronic Care Immunizations   Influenza vaccine: Fluvax Non-MCR  (03/23/2010)    Tetanus booster: Not documented    Pneumococcal vaccine: Not documented  Colorectal Screening   Hemoccult: Not documented   Hemoccult action/deferral: Deferred  (03/23/2010)    Colonoscopy: Not documented   Colonoscopy action/deferral: Deferred  (03/23/2010)  Other Screening   Pap  smear: Not documented   Pap smear action/deferral: Not indicated S/P hysterectomy  (03/23/2010)    Mammogram: ASSESSMENT: Negative - BI-RADS 1^MM DIGITAL SCREENING  (04/05/2010)   Mammogram action/deferral: Ordered  (03/23/2010)   Smoking status: current  (05/10/2010)   Smoking cessation counseling: yes  (05/10/2010)  Lipids   Total Cholesterol: 226  (08/11/2008)   LDL: 128  (08/11/2008)   LDL Direct: Not documented   HDL: 57  (08/11/2008)   Triglycerides: 207  (08/11/2008)  Resource handout printed.

## 2010-08-24 NOTE — Letter (Signed)
Summary: [facility]   Imported By: Margie Billet 10/21/2009 10:54:57  _____________________________________________________________________  External Attachment:    Type:   Image     Comment:   External Document

## 2010-08-24 NOTE — Letter (Signed)
Summary: [facility]  [facility]   Imported By: Margie Billet 11/26/2009 11:33:06  _____________________________________________________________________  External Attachment:    Type:   Image     Comment:   External Document

## 2010-08-24 NOTE — Progress Notes (Signed)
Summary: refill/gg  Phone Note Refill Request  on August 17, 2009 11:00 AM  Refills Requested: Medication #1:  CALCIUM-VITAMIN D 500-200 MG-UNIT TABS Take 1 tablet by mouth three times a day  Method Requested: Electronic Initial call taken by: Merrie Roof RN,  August 17, 2009 11:00 AM    Prescriptions: CALCIUM-VITAMIN D 500-200 MG-UNIT TABS (CALCIUM CARBONATE-VITAMIN D) Take 1 tablet by mouth three times a day  #92 x 11   Entered and Authorized by:   Blanch Media MD   Signed by:   Blanch Media MD on 08/17/2009   Method used:   Electronically to        [facility] (retail)       [address]       [place], [place]  [id number]       Ph: [phone]       Fax: [phone]   RxID:   [id number]

## 2010-08-26 NOTE — Assessment & Plan Note (Signed)
Summary: BACK PAIN /SB.   Vital Signs:  Patient profile:   55 year old female Height:      67 inches (170.18 cm) Weight:      199.4 pounds (90.64 kg) BMI:     31.34 Temp:     97.8 degrees F (36.56 degrees C) oral Pulse rate:   56 / minute BP sitting:   127 / 76  (right arm) Cuff size:   regular  Vitals Entered By: Theotis Barrio NT II (August 12, 2010 8:23 AM) CC: MEDICATION REFILL  /  CHRONIC BACK PAIN  #4 /  HAVING BRUSES ARPPEARING ON BODY FOR ABOUT 2-3 MONTHS,  Is Patient Diabetic? No Pain Assessment Patient in pain? no      Nutritional Status BMI of > 30 = obese  Have you ever been in a relationship where you felt threatened, hurt or afraid?No   Does patient need assistance? Functional Status Self care Ambulation Normal   Primary Care Provider:  Judie Grieve MD  CC:  MEDICATION REFILL  /  CHRONIC BACK PAIN  #4 /  HAVING BRUSES ARPPEARING ON BODY FOR ABOUT 2-3 MONTHS and .  History of Present Illness: 55 yo f with PMH in EMR, presents to [facility] for f/u of her back:   Back pain: stable and unchanged, would like refill on pain meds.   Pain is well controlled on her current meds.  Denies any new weakness, tingling/numbess, urinary/fecal incontinence, or urinary/fecal retention.   Pt reports abnormal bruising over the past 3 months. States bruises arise without any trauma.  She states her mom and daughter bruise easily but that she has never bruises without "bumping something."  She denies fevers but reports chills.   She denies weight loss.  SHe admits to fatigue that she believs is 2/2 weight gain.  She aslo reports cold intolerance but denies hair loss.  Reports bruises occur primarily on her arms and legs; she has not had any bruises on her abdomen, back, or face.  SHe denies hematuria, BRBPR, or dark black BMs.  Patient is feeling well and denies CP, abdominal pain, nausea, vomiting, HA's, palpitations, blurred vision. fever, chills, diarrhea, constipation or SOB.  last BM was this morning.    pt refuses colon study    Depression History:      The patient denies a depressed mood most of the day and a diminished interest in her usual daily activities.         Preventive Screening-Counseling & Management  Alcohol-Tobacco     Smoking Status: quit     Smoking Cessation Counseling: yes     Packs/Day: 1 cig per week     Year Quit: DEC. 2011  Caffeine-Diet-Exercise     Does Patient Exercise: no  Current Medications (verified): 1)  Prozac 20 Mg Caps (Fluoxetine Hcl) .... Take One Capsule By Mouth  One Time Per Day 2)  Omeprazole 20 Mg Cpdr (Omeprazole) .... Take 1 Tablet Twice A Day. 3)  Multivitamins Tabs (Multiple Vitamin) .... Take One Tablet By Mouth Once Daily. 4)  Calcium-Vitamin D 500-200 Mg-Unit Tabs (Calcium Carbonate-Vitamin D) .... Take 1 Tablet By Mouth Three Times A Day 5)  Trazodone Hcl 100 Mg Tabs (Trazodone Hcl) .... Take 1 Tablet By Mouth Once A  Per Dr. Marney Setting, [facility] 6)  Tramadol Hcl 50 Mg Tabs (Tramadol Hcl) .... Take 1 Tablet By Mouth Every 8 Hour For Pain As Needed. 7)  Miralax  Powd (Polyethylene Glycol 3350) .... Mix One  Pack (17g) With A Glass of Water and Drink Once Daily. 8)  Diclofenac Sodium 75 Mg Tbec (Diclofenac Sodium) .... Take 1 Tablet By Mouth Two Times A Day As Needed For Pain.  Allergies (verified): 1)  ! Aspirin (Aspirin)  Past History:  Past medical, surgical, family and social histories (including risk factors) reviewed for relevance to current acute and chronic problems.  Past Medical History: Reviewed history from 05/15/2006 and no changes required. Depression H/o anxiety, panic attacks GERD Dysphagia sec to laryngopharyngeal reflux H/o goiter H/o Tobacco abuse  Past Surgical History: Reviewed history from 05/15/2006 and no changes required. Hysterectomy  Family History: Reviewed history and no changes required.  Social History: Reviewed history from 09/06/2006 and no changes  required. Retired Former Smoker quit 3 months back 11/07 Alcohol use-no Smoking Status:  quit  Physical Exam  General:  Vital signs reviewed and noted. Well-developed, well-nourished, in no acute distress; alert,appropriate and cooperative throughout examination. Head: normocephalic, atraumatic. Neck: No deformities, masses, or tenderness noted. Lungs: Normal respiratory effort. Clear to auscultation BL without crackles or wheezes.  Heart: RRR. S1 and S2 normal without gallop, murmur, or rubs.  Abdomen: BS normoactive. Soft, Nondistended, non-tender.  No masses or organomegaly. Extremities: No pretibial edema.  Neurologic:  alert & oriented X3, cranial nerves II-XII intact, strength normal in all extremities, sensation intact to light touch, and gait normal.     Impression & Recommendations:  Problem # 1:  FATIGUE (ICD-780.79) Pt denies a depressed mood.  WIll assess for thyroid dysfunction and check TSH as pt also reports weight gain and cold intolerence.   Orders: T-CMP with Estimated GFR ([id number]) T-CBC w/Diff ([id number]) T-TSH [phone])  Problem # 2:  WEIGHT GAIN (ICD-783.1) Discusses healthy eating habits.  WIll check TSH to eval for thyroid dysfunction.  Orders: T-TSH ([id number])  Problem # 3:  ECCHYMOSES, SPONTANEOUS (ICD-782.7) Pt reports brusing on arms and legs.  I do not believe this reflect an underlying pathology, but will check CBC with diff today.  Problem # 4:  BACK PAIN (ICD-724.5) Stable.  WIll continue current med regimen Her updated medication list for this problem includes:    Tramadol Hcl 50 Mg Tabs (Tramadol hcl) .Marland Kitchen... Take 1 tablet by mouth every 8 hour for pain as needed.    Diclofenac Sodium 75 Mg Tbec (Diclofenac sodium) .Marland Kitchen... Take 1 tablet by mouth two times a day as needed for pain.  Problem # 5:  Preventive Health Care (ICD-V70.0) Pt refuses colon study.  Reports colonoscopy 2-3 yrs ago; will try to locate report.  Complete  Medication List: 1)  Prozac 20 Mg Caps (Fluoxetine hcl) .... Take one capsule by mouth  one time per day 2)  Omeprazole 20 Mg Cpdr (Omeprazole) .... Take 1 tablet twice a day. 3)  Multivitamins Tabs (Multiple vitamin) .... Take one tablet by mouth once daily. 4)  Calcium-vitamin D 500-200 Mg-unit Tabs (Calcium carbonate-vitamin d) .... Take 1 tablet by mouth three times a day 5)  Trazodone Hcl 100 Mg Tabs (Trazodone hcl) .... Take 1 tablet by mouth once a  per dr. Marney Setting, mental heath 6)  Tramadol Hcl 50 Mg Tabs (Tramadol hcl) .... Take 1 tablet by mouth every 8 hour for pain as needed. 7)  Miralax Powd (Polyethylene glycol 3350) .... Mix one pack (17g) with a glass of water and drink once daily. 8)  Diclofenac Sodium 75 Mg Tbec (Diclofenac sodium) .... Take 1 tablet by mouth two times a day as needed for pain.  Patient Instructions: 1)  Please schedule a follow-up appointment as needed. 2)  I will call you if any of your lab work is abnormal. 3)  Keep taking all of your medicines as directed. Prescriptions: TRAMADOL HCL 50 MG TABS (TRAMADOL HCL) Take 1 tablet by mouth every 8 hour for pain as needed.  #90 Tablet x 1   Entered and Authorized by:   Nelda Bucks DO   Signed by:   Nelda Bucks DO on 08/12/2010   Method used:   Electronically to        [id number] (retail)       [address]       [place], [place]  [id number]       Ph: [phone]       Fax: [phone]   RxID:   [id number]    Orders Added: 1)  T-CMP with Estimated GFR [[id number]] 2)  T-CBC w/Diff [[id number]] 3)  T-TSH [[id number]] 4)  Est. Patient Level IV [60109]   Process Orders Check Orders Results:     [facility]: Check successful Tests Sent for requisitioning (August 12, 2010 9:18 AM):     08/12/2010: [facility] -- T-CMP with Estimated GFR [[id number]] (signed)     08/12/2010: [facility] -- T-CBC w/Diff [[id number]]  (signed)     08/12/2010: [facility] -- T-TSH [phone] (signed)     Prevention & Chronic Care Immunizations   Influenza vaccine: Fluvax Non-MCR  (03/23/2010)    Tetanus booster: Not documented    Pneumococcal vaccine: Not documented  Colorectal Screening   Hemoccult: Not documented   Hemoccult action/deferral: Deferred  (03/23/2010)    Colonoscopy: Not documented   Colonoscopy action/deferral: Deferred  (03/23/2010)  Other Screening   Pap smear: Not documented   Pap smear action/deferral: Not indicated S/P hysterectomy  (03/23/2010)    Mammogram: ASSESSMENT: Negative - BI-RADS 1^MM DIGITAL SCREENING  (04/05/2010)   Mammogram action/deferral: Ordered  (03/23/2010)  Reports requested:   Last colonoscopy report requested.  Smoking status: quit  (08/12/2010)  Lipids   Total Cholesterol: 226  (08/11/2008)   LDL: 128  (08/11/2008)   LDL Direct: Not documented   HDL: 57  (08/11/2008)   Triglycerides: 207  (08/11/2008)   Nursing Instructions: Request report of last colonoscopy

## 2010-08-30 ENCOUNTER — Ambulatory Visit: Payer: Self-pay | Admitting: Dietician

## 2010-10-06 ENCOUNTER — Encounter: Payer: Self-pay | Admitting: Internal Medicine

## 2010-10-10 ENCOUNTER — Other Ambulatory Visit: Payer: Self-pay | Admitting: Internal Medicine

## 2010-11-08 ENCOUNTER — Ambulatory Visit (HOSPITAL_COMMUNITY)
Admission: RE | Admit: 2010-11-08 | Discharge: 2010-11-08 | Disposition: A | Discharge status: Home / Self Care | Payer: Medicare Other | Source: Ambulatory Visit | Attending: Cardiovascular Disease | Admitting: Cardiovascular Disease

## 2010-11-08 ENCOUNTER — Ambulatory Visit: Payer: Medicare Other | Admitting: Dietician

## 2010-11-08 ENCOUNTER — Ambulatory Visit (INDEPENDENT_AMBULATORY_CARE_PROVIDER_SITE_OTHER): Payer: Medicare Other | Admitting: Internal Medicine

## 2010-11-08 ENCOUNTER — Encounter: Payer: Self-pay | Admitting: Internal Medicine

## 2010-11-08 VITALS — BP 121/77 | HR 51 | Temp 97.0°F | Ht 66.0 in | Wt 194.5 lb

## 2010-11-08 DIAGNOSIS — R52 Pain, unspecified: Secondary | ICD-10-CM | POA: Insufficient documentation

## 2010-11-08 DIAGNOSIS — I498 Other specified cardiac arrhythmias: Secondary | ICD-10-CM

## 2010-11-08 DIAGNOSIS — I4949 Other premature depolarization: Secondary | ICD-10-CM | POA: Insufficient documentation

## 2010-11-08 DIAGNOSIS — R001 Bradycardia, unspecified: Secondary | ICD-10-CM | POA: Insufficient documentation

## 2010-11-08 DIAGNOSIS — Z23 Encounter for immunization: Secondary | ICD-10-CM

## 2010-11-08 HISTORY — DX: Bradycardia, unspecified: R00.1

## 2010-11-08 LAB — CBC
MCH: 30.7 pg (ref 26.0–34.0)
MCHC: 32.5 g/dL (ref 30.0–36.0)
MCV: 94.3 fL (ref 78.0–100.0)

## 2010-11-08 LAB — VITAMIN B12: Vitamin B-12: 328 pg/mL (ref 211–911)

## 2010-11-08 LAB — C-REACTIVE PROTEIN: CRP: 1.8 mg/dL — ABNORMAL HIGH (ref <0.6)

## 2010-11-08 LAB — TSH: TSH: 0.556 u[IU]/mL (ref 0.350–4.500)

## 2010-11-08 MED ORDER — TRAMADOL HCL 50 MG PO TABS: 50.0000 mg | ORAL_TABLET | Freq: Three times a day (TID) | ORAL | Status: DC | PRN

## 2010-11-08 MED ORDER — DICLOFENAC SODIUM 75 MG PO TBEC: 75.0000 mg | DELAYED_RELEASE_TABLET | Freq: Two times a day (BID) | ORAL | Status: DC | PRN

## 2010-11-08 NOTE — Assessment & Plan Note (Signed)
It is most likely musculoskeletal in origin, given her recent activity assisting with her brother after his stroke. Informed patient of this. We'll also run routine test to rule out endocrine, autoimmune, nutritional disorders which may be related to muscle aches.

## 2010-11-08 NOTE — Assessment & Plan Note (Signed)
This is been worked up thoroughly, EKG done today which was within normal limits.

## 2010-11-08 NOTE — Progress Notes (Signed)
  Subjective:    Patient ID: [name], female    DOB: Dec 25, 1955, 55 y.o.   MRN: [id number]  HPI  Patient is a 55 year old female with a past medical history of depression, arthritis, and GERD. Presents to [facility] for a routine follow-up. Today she complains of whole body aches for past 2-3 months. She reports taking care of her brother who had a stroke 2 months ago, she appears to be the primary caretaker, and is doing more physical exercise activities than she is accustomed to. Patient denies any other complaints. Would like refills on her medication.  Review of Systems  All other systems reviewed and are negative.       Objective:   Physical Exam  Nursing note and vitals reviewed. Constitutional: She is oriented to person, place, and time. She appears well-developed and well-nourished.  HENT:  Head: Normocephalic and atraumatic.  Eyes: Pupils are equal, round, and reactive to light.  Neck: Normal range of motion. Neck supple. No JVD present. No thyromegaly present.  Cardiovascular: Regular rhythm and normal heart sounds.  Bradycardia present.   No murmur heard. Pulmonary/Chest: Effort normal and breath sounds normal. She has no wheezes. She has no rales.  Abdominal: Soft. Bowel sounds are normal.  Musculoskeletal: Normal range of motion. She exhibits no edema.  Neurological: She is alert and oriented to person, place, and time.  Skin: Skin is warm and dry.          Assessment & Plan:

## 2010-11-09 LAB — COMPLETE METABOLIC PANEL WITH GFR
ALT: 10 U/L (ref 0–35)
BUN: 8 mg/dL (ref 6–23)
CO2: 32 mEq/L (ref 19–32)
Chloride: 100 mEq/L (ref 96–112)
GFR, Est Non African American: >60 mL/min (ref >60)
Total Protein: 7.2 g/dL (ref 6.0–8.3)

## 2010-11-09 LAB — T4, FREE: Free T4: 1.01 ng/dL (ref 0.80–1.80)

## 2010-11-09 LAB — SEDIMENTATION RATE: Sed Rate: 14 mm/hr (ref 0–22)

## 2010-11-10 NOTE — Progress Notes (Signed)
Medical Nutrition Therapy:  Appt start time: 0930 end time:  1000.  Assessment:  Primary concerns today: Weight management Usual eating pattern includes Meal 2 and 0+ snacks per day.    Avoided foods include: cookies and candy, soda and sweetened cereal.    Usual physical activity includes ADLs and helping family members    Progress Towards Goal(s):  In progress   Nutritional Diagnosis:  NB-1.1 Food and nutrition-related knowledge deficit As related to lack of prior exposure.  As evidenced by patient report.  Interventions: 1- education counseling on basic principles of healthy eating and weight loss 2- Meal plan for 1500 calories and 42 grams of fat per day explained and provided.    3- Patient show how to record food intake and asked to keep a food record.   Monitoring/Evaluation:  Per Dietary intake prn

## 2010-11-11 ENCOUNTER — Encounter: Payer: Self-pay | Admitting: Internal Medicine

## 2010-11-11 ENCOUNTER — Ambulatory Visit (INDEPENDENT_AMBULATORY_CARE_PROVIDER_SITE_OTHER): Payer: Medicare Other | Admitting: Internal Medicine

## 2010-11-11 VITALS — BP 127/78 | HR 50 | Temp 98.6°F | Ht 66.5 in | Wt 197.3 lb

## 2010-11-11 DIAGNOSIS — R52 Pain, unspecified: Secondary | ICD-10-CM

## 2010-11-11 DIAGNOSIS — M791 Myalgia, unspecified site: Secondary | ICD-10-CM

## 2010-11-11 DIAGNOSIS — IMO0001 Reserved for inherently not codable concepts without codable children: Secondary | ICD-10-CM

## 2010-11-11 LAB — C-REACTIVE PROTEIN: CRP: 1.3 mg/dL — ABNORMAL HIGH (ref <0.6)

## 2010-11-11 LAB — CK: Total CK: 109 U/L (ref 7–177)

## 2010-11-11 NOTE — Progress Notes (Signed)
  Subjective:    Patient ID: [name], female    DOB: 20-Jan-1956, 55 y.o.   MRN: [id number]  HPI  This is a 55 year old female with a past medical history per EMR, 3 days ago she presented the outpatient clinic with symptoms of muscle Aches, routine labs were drawn along with the normal limits, except for an elevated CRP at 1.8. She presented today to discuss her lab results, and states that her aches were relieved by the use of diclofenac. No other complaints.   Review of Systems  [all other systems reviewed and are negative       Objective:   Physical Exam  [vitalsreviewed. Constitutional: She is oriented to person, place, and time. She appears well-developed and well-nourished.  HENT:  Head: Normocephalic and atraumatic.  Eyes: Pupils are equal, round, and reactive to light.  Neck: Normal range of motion. Neck supple. No JVD present. No thyromegaly present.  Cardiovascular: Normal rate, regular rhythm and normal heart sounds.   No murmur heard. Pulmonary/Chest: Effort normal and breath sounds normal. She has no wheezes. She has no rales.  Abdominal: Soft. Bowel sounds are normal.  Musculoskeletal: Normal range of motion. She exhibits no edema.  Neurological: She is alert and oriented to person, place, and time.  Skin: Skin is warm and dry.          Assessment & Plan:

## 2010-11-11 NOTE — Assessment & Plan Note (Signed)
Patient is not reporting any aches, we'll recheck a CRP along with a CPK given that her CRP was elevated 3 days ago. Is trending down we'll not check any more, if it is still elevated we'll recheck it next followup.

## 2010-12-10 ENCOUNTER — Ambulatory Visit: Payer: Medicare Other | Admitting: Dietician

## 2010-12-10 NOTE — Discharge Summary (Signed)
[name], [name]                 ACCOUNT NO.:  [id number]   MEDICAL RECORD NO.:  [id number]          PATIENT TYPE:  INP   LOCATION:  2040                         FACILITY:  [facility]   PHYSICIAN:  C. Ulyess Mort, M.D.DATE OF BIRTH:  June 10, 1956   DATE OF ADMISSION:  12/31/2004  DATE OF DISCHARGE:  01/02/2005                                 DISCHARGE SUMMARY   DISCHARGE DIAGNOSES:  1.  Atypical chest pain of no cardiac in origin.  2.  Gastroesophageal reflux disease.  3.  Goiter.  4.  Dyslipidemia.   DISCHARGE MEDICATIONS:  1.  Prevacid 30 mg p.o. daily.  2.  Prozac 20 mg p.o. daily.   DISPOSITION:  She is to come to outpatient clinic for a followup on January 18 2005.  She is to go home with a hospital followup as already said.   PROCEDURES:  1.  Chest x-ray on January 01, 2005.  Impression:  No acute cardiopulmonary      process.  2.  Cardiolite on January 02, 2005.  Impression:  No inducible ischemia.      Normal wall motion.  70% estimated ejection fraction.   CONSULTATIONS:  Dr. Kristeen Miss from cardiology.   HISTORY OF PRESENT ILLNESS:  A 55 year old woman with past medical history  significant for depression came to the [facility] complaining of right-sided chest  pain on and off, radiated to retrosternal, normally at rest, sometimes  radiated to the back, 7/10, of seconds, sometimes hours, as pressing,  sometimes as stabbing associated with diaphoresis.  Sometimes some shortness  of breath associated with it.  No nausea, no vomiting, no cough, no  hemoptysis. Pain is relieved by Tylenol.  Last time of seconds here in the  waiting room yesterday for about 30 minutes.   ALLERGIES:  No known drug allergies.   PHYSICAL EXAMINATION:  VITAL SIGNS: At admission, pulse was 51, blood  pressure 100/70, temperature 98.3, respiratory rate 14, oxygen saturation  99% on room air.  NECK:  Goiter 2+.   LABORATORY DATA AT ADMISSION:  CBC:  White blood cell count 5.3, hemoglobin  13.2,  hematocrit 38.9, platelets 237, MCV 93.5.  Sodium 137, potassium 3.9,  chloride 103, CO2 26, glucose 107, BUN 5, creatinine 0.9, bilirubin 0.6, alk  phos 66, AST 15, ALT 8, total protein 6.2, albumin 3.4, calcium 8.7,  magnesium 2.1, lipase 52, amylase 107.  Cardiac panel x3 within normal  limits.  TSH 1.388.  Free T4 1.11.  Free T3 2.5.   HOSPITAL COURSE:  1.  Atypical chest pain.  During this hospitalization, the patient remained      without pain.  EKG did not show any changes.  Cardiac enzymes were      within normal limits.  Chest x-ray did not show any acute pathology.      Cardiology was consulted even though the patient was atypical. Decided      to do a Cardiolite in house due to several no-shows of this patient at      cardiology and they proceeded with Cardiolite which was negative for  ischemia.  The patient was discharged home in stable condition and most      probably this pain is related to musculoskeletal or to gastroesophageal      reflux disease. She is going to be followed up as an outpatient.   1.  Gastroesophageal reflux disease.  During  this hospitalization, the      patient was treated with Protonix. She did not present any issues from      this point of view and she is to continue with Prevacid at home and      follow up for this at the outpatient clinic.   1.  Goiter which is an euthyroid goiter as the TSH and T4, T3 were within      normal limits.   1.  Dyslipidemia.  This is going to be treated for right now with diet and      is to be followed up at the outpatient clinic.   LABORATORY DATA AT DISCHARGE:  Lipid profile showed cholesterol 223,  triglycerides 170, HDL 38, LDL 151.  Urine drug screen was positive for  marijuana.  White blood cell count 3.6, hemoglobin 12.2, hematocrit 35.5,  platelets 218.  Sodium 140, potassium 3.5, chloride 111, CO2 24, glucose 96,  BUN 4, creatinine 0.8, calcium 8.4.  Marijuana confirmation was more than  500  ng/mL.       YC/MEDQ  D:  01/18/2005  T:  01/19/2005  Job:  [id number]

## 2010-12-10 NOTE — Consult Note (Signed)
[name], [name]                 ACCOUNT NO.:  [id number]   MEDICAL RECORD NO.:  [id number]          PATIENT TYPE:  INP   LOCATION:  2040                         FACILITY:  [facility]   PHYSICIAN:  Vesta Mixer, M.D. DATE OF BIRTH:  1956/02/26   DATE OF CONSULTATION:  01/01/2005  DATE OF DISCHARGE:                                   CONSULTATION   CARDIOLOGY CONSULTATION   [name] is a 55 year old female with a history of two months of chest pain.  She also has a history of hypercholesterolemia, cigarette smoking in the  past, history of alcohol abuse.  She is admitted to the hospital with two  months of chest pain.   The chest pains are rather atypical.  They are off and on.  They are not  brought on by any specific activity, such as exercise, eating, drinking,  change in position or taking a deep breath.  She states that when they do  occur, she usually stops and rests, and they go away after several minutes.  She has not had any episodes of syncope or presyncope.  She denies any  diaphoresis.  The pains are centered in the right upper chest area.   CURRENT MEDICATIONS:  1.  Wellbutrin.  2.  Prozac.  3.  Protonix.  4.  Folic acid.  5.  Aspirin.  6.  Ambien as needed.   ALLERGIES:  NONE, ALTHOUGH SHE STATES THAT SHE SOMETIMES GET NAUSEATED TO  ASPIRIN.   PAST MEDICAL HISTORY:  1.  History of depression and anxiety.  2.  History of substance abuse.   SOCIAL HISTORY:  The patient smokes marijuana occasionally.  She used to  drink a lot but has not drunk in sometime.   REVIEW OF SYSTEMS:  Her review of systems was reviewed and was essentially  negative.   PHYSICAL EXAMINATION:  GENERAL:  On exam, she is a middle-aged female in no  acute distress.  MENTAL STATUS:  She is alert and oriented  x 3 and her mood and affect are  normal.  VITAL SIGNS:  Heart rate is 42, her blood pressure is 121/75.  HEENT:  She has 2+ carotids.  She has no bruits, no JVD and no  thyromegaly.  LUNGS:  Clear to auscultation.  HEART:  Regular rate, S1 and S2, somewhat bradycardic.  ABDOMINAL:  Her exam reveals good bowel sounds and is nontender.  EXTREMITIES:  She has no clubbing, cyanosis or edema.  NEURO EXAM:  Nonfocal.   Her total cholesterol is 223, her triglycerides are 170, HDL 38, LDL is 151.  Her CPK and MB's are negative x 2.  Her EKG reveals sinus bradycardia.  She  has no ST- or T-wave changes.   ASSESSMENT AND PLAN:  1.  Atypical chest pain.  Her enzymes are negative.  She appears to be quite      stable and in fact, could do the stress Cardiolite as an outpatient.      She has failed to show up for a cardiology evaluation three times in the  past.  Since she is already here, we will plan on doing a stress      Cardiolite study tomorrow morning.  If the Cardiolite study is negative,      then she can go home.  If the Cardiolite is abnormal, then she will      likely need heart catheterization.  2.  Bradycardia.  The patient has rather asymptomatic bradycardia.  We will      see what her chronotropic response to exercise is tomorrow during the      stress test.  If she starts having any episodes of syncope, then we will      need to consider a pacemaker.       PJN/MEDQ  D:  01/01/2005  T:  01/01/2005  Job:  [id number]   cc:   Ileana Roup, M.D.  [address], [place] [address]  Fax: [phone]

## 2010-12-10 NOTE — Group Therapy Note (Signed)
NAMELALITA, EBEL NO.:  [id number]   MEDICAL RECORD NO.:  [id number]          PATIENT TYPE:  WOC   LOCATION:  [facility]                   FACILITY:  [facility]   PHYSICIAN:  Argentina Donovan, MD        DATE OF BIRTH:  [birth year]   DATE OF SERVICE:  07/01/2004                                    CLINIC NOTE   REASON FOR VISIT:  The patient is a 55 year old black female who has had a  total abdominal hysterectomy and bilateral salpingo-oophorectomy at the age  of 1 on trazodone and Prozac, as well as Premarin for hot flashes which I  am trying to get her to stop.  She has been told she does not need Pap  smears anymore.  Her main chief complaints are unilateral headaches lasting  about an hour, not accompanied by visual disturbances, coldness of  extremities, or depression.  She is also bothered by GERD which has not been  evaluated and has recurrent numbness in her right thigh.  We are going to  refer her to the medical clinic.  We are going to dry a titer for  Helicobacter pylori, starting on Prevacid 20 mg h.s., and get some  lumbosacral x-rays so that they can evaluate the numbness in her leg.   IMPRESSION:  1.  Gastroesophageal reflux disease.  2.  Postmenopausal symptoms.  3.  Tension headaches.  4.  Numbness, right thigh.      PR/MEDQ  D:  07/01/2004  T:  07/02/2004  Job:  [id number]

## 2010-12-10 NOTE — Group Therapy Note (Signed)
NAME:  [name], [name]                           ACCOUNT NO.:  [id number]   MEDICAL RECORD NO.:  [id number]                   PATIENT TYPE:  OUT   LOCATION:  [facility]                           FACILITY:  [facility]   PHYSICIAN:  Argentina Donovan, MD                     DATE OF BIRTH:  17-Nov-1955   DATE OF SERVICE:  09/11/2003                                    CLINIC NOTE   REASON FOR VISIT:  A 55 year old black female with a history of TAH and  bilateral S&O at age 33 is on trazodone and Prozac for depression and  otherwise has been in generally good health, has had a history in the past  of thyroid problems but is not on any thyroid at the present time, and has  also had a history of lipid problems but is on no medication.  Her main  complaint is severe hot flashes since she stopped Premarin 2 years ago.  She  has normal blood pressure and no other physical complaints.   PHYSICAL EXAMINATION:  NECK:  Her thyroid was normal and symmetrical with no  masses.  BREAST:  Symmetrical and bilateral with no nipple discharge and no dominant  masses.  ABDOMEN:  Soft, flat, nontender, with no masses, no organomegaly, midline  subumbilical incision scar from hysterectomy and a right upper quadrant scar  from a previous cholecystectomy.  PELVIC:  The external genitalia is normal.  The vagina is clean and well  rugated with a marital introitus, is status post hysterectomy.  Pelvic shows  no abnormalities.  RECTAL:  No mass.   IMPRESSION:  Normal gynecological examination.   PLAN:  Screen for thyroid and lipids and order a mammogram.  Will start the  patient on Premarin 0.45 and after 1 month she is to call if it is not  sufficient and we may up the dosage, and she will come in for her annual  examinations.  We do not feel she needs a Pap smear anymore since the  hysterectomy was done for fibroids.                                               Argentina Donovan, MD    PR/MEDQ  D:  09/11/2003  T:   09/11/2003  Job:  [id number]

## 2011-02-01 ENCOUNTER — Other Ambulatory Visit: Payer: Self-pay | Admitting: Internal Medicine

## 2011-02-28 ENCOUNTER — Encounter: Payer: Medicare Other | Admitting: Internal Medicine

## 2011-03-21 ENCOUNTER — Ambulatory Visit (INDEPENDENT_AMBULATORY_CARE_PROVIDER_SITE_OTHER): Payer: Medicare Other | Admitting: Internal Medicine

## 2011-03-21 ENCOUNTER — Encounter: Payer: Self-pay | Admitting: Internal Medicine

## 2011-03-21 DIAGNOSIS — M549 Dorsalgia, unspecified: Secondary | ICD-10-CM

## 2011-03-21 DIAGNOSIS — M545 Low back pain: Secondary | ICD-10-CM | POA: Insufficient documentation

## 2011-03-21 DIAGNOSIS — R52 Pain, unspecified: Secondary | ICD-10-CM

## 2011-03-21 DIAGNOSIS — E785 Hyperlipidemia, unspecified: Secondary | ICD-10-CM

## 2011-03-21 LAB — BASIC METABOLIC PANEL
CO2: 33 mEq/L — ABNORMAL HIGH (ref 19–32)
Chloride: 100 mEq/L (ref 96–112)
Glucose, Bld: 89 mg/dL (ref 70–99)
Potassium: 3.3 mEq/L — ABNORMAL LOW (ref 3.5–5.3)
Sodium: 141 mEq/L (ref 135–145)

## 2011-03-21 LAB — LIPID PANEL: Total CHOL/HDL Ratio: 4.7 Ratio

## 2011-03-21 MED ORDER — DICLOFENAC SODIUM 75 MG PO TBEC: 75.0000 mg | DELAYED_RELEASE_TABLET | Freq: Two times a day (BID) | ORAL | Status: DC

## 2011-03-21 MED ORDER — TRAMADOL HCL 50 MG PO TABS: 50.0000 mg | ORAL_TABLET | Freq: Three times a day (TID) | ORAL | Status: DC | PRN

## 2011-03-21 NOTE — Progress Notes (Signed)
  Subjective:    Patient ID: [name], female    DOB: January 02, 1956, 55 y.o.   MRN: [id number]  HPI  This is a 55 year old female with a past medical history below, she is overall doing well and taking all of her medications as prescribed. Today she continues to complain of chronic back pain and requests prescriptions for pain medications.  Note that her back pain is relieved by the use of diclofenac and tramadol only. No other complaints.     Review of Systems  All other systems reviewed and are negative.       Objective:   Physical Exam  Vitals reviewed. Constitutional: She is oriented to person, place, and time. She appears well-developed and well-nourished.  HENT:  Head: Normocephalic and atraumatic.  Eyes: Pupils are equal, round, and reactive to light.  Neck: Normal range of motion. Neck supple. No JVD present. No thyromegaly present.  Cardiovascular: Normal rate, regular rhythm and normal heart sounds.   No murmur heard. Pulmonary/Chest: Effort normal and breath sounds normal. She has no wheezes. She has no rales.  Abdominal: Soft. Bowel sounds are normal.  Musculoskeletal: Normal range of motion. She exhibits no edema.  Neurological: She is alert and oriented to person, place, and time.  Skin: Skin is warm and dry.          Assessment & Plan:

## 2011-03-21 NOTE — Assessment & Plan Note (Signed)
Chronic Lower Back Pain: Patient has had lower back pain for several years. Pain started when she .  XRAY show DJD.  No neuro signs, no change in weight, no fevers or chills. Will continue him on home diclofenac and tramadol. If symptoms get worse will get MRI to evaluate for disease progression.

## 2011-03-22 MED ORDER — POTASSIUM CHLORIDE ER 10 MEQ PO TBCR: 10.0000 meq | EXTENDED_RELEASE_TABLET | Freq: Two times a day (BID) | ORAL | Status: DC

## 2011-03-22 NOTE — Progress Notes (Signed)
Addended by: Darnelle Maffucci on: 03/22/2011 07:57 AM   Modules accepted: Orders

## 2011-04-29 ENCOUNTER — Other Ambulatory Visit: Payer: Self-pay | Admitting: Internal Medicine

## 2011-05-03 ENCOUNTER — Telehealth: Payer: Self-pay | Admitting: *Deleted

## 2011-05-03 ENCOUNTER — Ambulatory Visit (INDEPENDENT_AMBULATORY_CARE_PROVIDER_SITE_OTHER): Payer: Medicare Other | Admitting: Internal Medicine

## 2011-05-03 ENCOUNTER — Encounter: Payer: Self-pay | Admitting: Internal Medicine

## 2011-05-03 VITALS — BP 131/85 | Temp 97.9°F | Ht 67.0 in | Wt 199.9 lb

## 2011-05-03 DIAGNOSIS — R05 Cough: Secondary | ICD-10-CM | POA: Insufficient documentation

## 2011-05-03 DIAGNOSIS — E876 Hypokalemia: Secondary | ICD-10-CM

## 2011-05-03 LAB — URINE MICROSCOPIC-ADD ON

## 2011-05-03 LAB — URINALYSIS, ROUTINE W REFLEX MICROSCOPIC
Glucose, UA: NEGATIVE
Hgb urine dipstick: NEGATIVE
Specific Gravity, Urine: 1.02
Urobilinogen, UA: 1
pH: 6

## 2011-05-03 MED ORDER — HYDROCOD POLST-CHLORPHEN POLST 10-8 MG/5ML PO LQCR: 5.0000 mL | Freq: Two times a day (BID) | ORAL | Status: DC

## 2011-05-03 MED ORDER — ACETAMINOPHEN 325 MG PO TABS: 650.0000 mg | ORAL_TABLET | ORAL | Status: AC | PRN

## 2011-05-03 NOTE — Progress Notes (Signed)
HPI  Patient is a 55 year old female with a past medical history of depression, arthritis, and GERD, bradycardia, who presents with cough. Her cough started a week ago, which is intermittent, worse in the night, wakes her up in the night. She coughs up a little yellow and whitish sputum without blood in it. She had been in a contact with her sick friend who had a cold before her symptoms started. She denies fever, chills, sweating, SOB, chest pain. She does have some soreness in her right flank muscles which she thinks from either her chronic body pain or from cough-induced muscle pain. She reports the Tylenol helped a little bit with her pain, but not with her cough. Her symptoms are getting little better since started. She does not have diarrhea, dysuria, palpitation, joint pain or rashes.  ROS: General: no fevers, chills, changes in weight, changes in appetite Skin: no rash HEENT: no blurry vision, hearing changes, sore throat Pulm: coughing, no wheezing and dyspnea CV: no chest pain, palpitations, shortness of breath Abd: no abdominal pain, nausea/vomiting, diarrhea/constipation GU: no dysuria, hematuria, polyuria Ext: no arthralgias, myalgias Neuro: no weakness, numbness, or tingling.   PE:   Vitals: T: 97.9  HR:53   BP:131/85    General: resting in bed HEENT: PERRL, EOMI, no scleral icterus. Erythema in posterior pharynx, no exudate, no tonsillar enlargement. Cardiac: RRR, no rubs, murmurs or gallops Pulm: clear to auscultation bilaterally, moving normal volumes of air, no rales, wheezing, rhochi or rubs Abd: soft, nontender, nondistended, BS present. Ext: warm and well perfused, no pedal edema Neuro: alert and oriented X3, cranial nerves II-XII grossly intact, strength and sensation to light touch equal in bilateral upper and lower extremities  Assessment & Plan:   Prolbme 1- Cough:  Most likely caused by upper respiratory viral infection. She had sick contact with friend with cold  before her symptoms started. It is unlikely that she has PNA, since her symptoms are mild, physical exam shows lung is clear to auscultation,  She is getting slightly better since it started. No fever, chills and chest pain. Will give anti-tussive to control her cough and tylenol for muscle pain. Patient was instructed to come back if getting worse or no improvement in 2 weeks.  Problem 2: bradycardia: HR is 53. Asymptomatic, will follow up.   Problem 3-Deprssion: stable, no new issues. Will follow up.  Problem 4: GERD: well controlled with omeprazole.

## 2011-05-03 NOTE — Patient Instructions (Signed)
I give you prescription of Tussionex [employer] for cough and Tylenol for pain. If your symptoms get worse, or not better in two weeks, please come back to our clinic.

## 2011-05-03 NOTE — Telephone Encounter (Signed)
Pt called with c/o productive cough and congestion.  Onset 1 1/2 weeks ago. Taking tylenol cold with some relief.  Fever on and off. Denies SOB but has some wheezing.  Weakness.  Will see today

## 2011-05-04 LAB — COMPLETE METABOLIC PANEL WITH GFR
ALT: 16 U/L (ref 0–35)
AST: 17 U/L (ref 0–37)
BUN: 6 mg/dL (ref 6–23)
Calcium: 9.4 mg/dL (ref 8.4–10.5)
Chloride: 100 mEq/L (ref 96–112)
Creat: 0.84 mg/dL (ref 0.50–1.10)
GFR, Est African American: >60 mL/min (ref >60)
Total Bilirubin: 0.5 mg/dL (ref 0.3–1.2)

## 2011-05-04 NOTE — Progress Notes (Signed)
I saw and discussed Ms Bookbinder with Dr Youlanda Roys and I agree with his note as contained here. Pt is overall getting better but still has lingering cough that is productive. Her lungs are clear and she is not toxic appearing on exam.

## 2011-05-24 ENCOUNTER — Ambulatory Visit (INDEPENDENT_AMBULATORY_CARE_PROVIDER_SITE_OTHER): Payer: Medicare Other | Admitting: *Deleted

## 2011-05-24 DIAGNOSIS — Z23 Encounter for immunization: Secondary | ICD-10-CM

## 2011-07-15 ENCOUNTER — Encounter: Payer: Self-pay | Admitting: Internal Medicine

## 2011-07-15 ENCOUNTER — Ambulatory Visit (INDEPENDENT_AMBULATORY_CARE_PROVIDER_SITE_OTHER): Payer: Medicare Other | Admitting: Internal Medicine

## 2011-07-15 DIAGNOSIS — M109 Gout, unspecified: Secondary | ICD-10-CM | POA: Insufficient documentation

## 2011-07-15 DIAGNOSIS — M549 Dorsalgia, unspecified: Secondary | ICD-10-CM

## 2011-07-15 DIAGNOSIS — F329 Major depressive disorder, single episode, unspecified: Secondary | ICD-10-CM | POA: Insufficient documentation

## 2011-07-15 LAB — URIC ACID: Uric Acid, Serum: 2.8 mg/dL (ref 2.4–7.0)

## 2011-07-15 MED ORDER — TRAMADOL HCL 50 MG PO TABS: 50.0000 mg | ORAL_TABLET | Freq: Three times a day (TID) | ORAL | Status: DC | PRN

## 2011-07-15 MED ORDER — COLCHICINE 0.6 MG PO TABS: 0.6000 mg | ORAL_TABLET | Freq: Every day | ORAL | Status: DC

## 2011-07-15 NOTE — Assessment & Plan Note (Signed)
Stable, continue tramadol, which will provide

## 2011-07-15 NOTE — Patient Instructions (Addendum)
Please take colchicine as needed for your wrist pain Please return to the clinic if you have swollen joints.

## 2011-07-15 NOTE — Assessment & Plan Note (Signed)
Stable on current dose of fluoxetine, patient denies any homicidal or suicidal ideation, continue current dose of fluoxetine.

## 2011-07-15 NOTE — Assessment & Plan Note (Addendum)
Clinical diagnoses given acute pain in her wrists and right greater toe, by that time the patient arrived at the clinic her effusion had subsided therefore were unable to obtain joint tap. Empirically treat with colchicine, obtain uric acid level and hand xray. and informed the patient if her effusion recurs to come back to the clinic for joint tap to establish a definitive diagnosis for gout.

## 2011-07-15 NOTE — Progress Notes (Signed)
Subjective:     Patient ID: [name], female   DOB: 1956/06/25, 55 y.o.   MRN: [id number]  HPI  Patient is a 55 year old female with a past medical history listed below, presents to the outpatient clinic for routine followup, complains of right wrist right knee and right greater toe pain and swelling for the past 7 days, she denies prior history of this. No trauma reported. Denies any fever or chills. Reports back pain stable would like a prescription refill for tramadol. No other complaints.  Patient Active Problem List  Diagnoses  . GOITER  . DEPRESSION  . GERD  . FATIGUE  . Body aches  . Bradycardia  . Back pain  . Cough  . Hypokalemia   Current Outpatient Prescriptions on File Prior to Visit  Medication Sig Dispense Refill  . acetaminophen (TYLENOL) 325 MG tablet Take 2 tablets (650 mg total) by mouth every 4 (four) hours as needed for pain.  100 tablet  1  . calcium-vitamin D (OSCAL WITH D 500-200) 500-200 MG-UNIT per tablet Take 1 tablet by mouth 3 (three) times daily.        . chlorpheniramine-HYDROcodone (TUSSIONEX PENNKINETIC ER) 10-8 MG/5ML LQCR Take 5 mLs by mouth every 12 (twelve) hours.  140 mL  1  . diclofenac (VOLTAREN) 75 MG EC tablet Take 1 tablet (75 mg total) by mouth 2 (two) times daily.  60 tablet  2  . FLUoxetine (PROZAC) 20 MG capsule Take 20 mg by mouth daily.        . Multiple Vitamin (MULTIVITAMIN) tablet Take 1 tablet by mouth daily.        Marland Kitchen omeprazole (PRILOSEC) 20 MG capsule take 1 capsule by mouth twice a day  60 capsule  5  . polyethylene glycol (MIRALAX) powder Take 17 g by mouth daily. Mix one pack with a glass of water       . potassium chloride (K-DUR) 10 MEQ tablet Take 1 tablet (10 mEq total) by mouth 2 (two) times daily.  6 tablet  0  . pravastatin (PRAVACHOL) 40 MG tablet Take 40 mg by mouth daily.        . traZODone (DESYREL) 100 MG tablet As directed per Dr. Marney Setting, Mental Health       . DISCONTD: traMADol (ULTRAM) 50 MG tablet Take 1  tablet (50 mg total) by mouth every 8 (eight) hours as needed. For pain  60 tablet  1   Allergies  Allergen Reactions  . Aspirin      Review of Systems  All other systems reviewed and are negative.       Objective:   Physical Exam  Nursing note and vitals reviewed. Constitutional: She is oriented to person, place, and time. She appears well-developed and well-nourished.  HENT:  Head: Normocephalic and atraumatic.  Eyes: Pupils are equal, round, and reactive to light.  Neck: Normal range of motion. Neck supple. No JVD present. No thyromegaly present.  Cardiovascular: Normal rate, regular rhythm and normal heart sounds.   No murmur heard. Pulmonary/Chest: Effort normal and breath sounds normal. She has no wheezes. She has no rales.  Abdominal: Soft. Bowel sounds are normal.  Musculoskeletal: Normal range of motion. She exhibits no edema.  Neurological: She is alert and oriented to person, place, and time.  Skin: Skin is warm and dry.

## 2011-07-29 ENCOUNTER — Ambulatory Visit (HOSPITAL_COMMUNITY)
Admission: RE | Admit: 2011-07-29 | Discharge: 2011-07-29 | Disposition: A | Discharge status: Home / Self Care | Payer: Medicare Other | Source: Ambulatory Visit | Attending: Internal Medicine | Admitting: Internal Medicine

## 2011-07-29 DIAGNOSIS — M25549 Pain in joints of unspecified hand: Secondary | ICD-10-CM | POA: Insufficient documentation

## 2011-07-29 DIAGNOSIS — M25539 Pain in unspecified wrist: Secondary | ICD-10-CM | POA: Insufficient documentation

## 2011-07-29 DIAGNOSIS — M19049 Primary osteoarthritis, unspecified hand: Secondary | ICD-10-CM | POA: Insufficient documentation

## 2011-09-08 ENCOUNTER — Other Ambulatory Visit: Payer: Self-pay | Admitting: Internal Medicine

## 2011-09-27 ENCOUNTER — Encounter: Payer: Medicare Other | Admitting: Internal Medicine

## 2011-10-30 ENCOUNTER — Other Ambulatory Visit: Payer: Self-pay | Admitting: Internal Medicine

## 2012-05-27 ENCOUNTER — Other Ambulatory Visit: Payer: Self-pay | Admitting: Internal Medicine

## 2012-05-28 NOTE — Telephone Encounter (Signed)
Is this our patient?

## 2012-05-29 ENCOUNTER — Encounter: Payer: Self-pay | Admitting: Internal Medicine

## 2012-05-29 ENCOUNTER — Ambulatory Visit (INDEPENDENT_AMBULATORY_CARE_PROVIDER_SITE_OTHER): Payer: Medicare Other | Admitting: Internal Medicine

## 2012-05-29 VITALS — BP 148/77 | HR 52 | Temp 96.7°F | Ht 67.0 in | Wt 195.1 lb

## 2012-05-29 DIAGNOSIS — M47817 Spondylosis without myelopathy or radiculopathy, lumbosacral region: Secondary | ICD-10-CM

## 2012-05-29 DIAGNOSIS — M1711 Unilateral primary osteoarthritis, right knee: Secondary | ICD-10-CM

## 2012-05-29 DIAGNOSIS — M25562 Pain in left knee: Secondary | ICD-10-CM | POA: Insufficient documentation

## 2012-05-29 DIAGNOSIS — M1710 Unilateral primary osteoarthritis, unspecified knee: Secondary | ICD-10-CM

## 2012-05-29 DIAGNOSIS — M25561 Pain in right knee: Secondary | ICD-10-CM | POA: Insufficient documentation

## 2012-05-29 DIAGNOSIS — Z23 Encounter for immunization: Secondary | ICD-10-CM

## 2012-05-29 DIAGNOSIS — M549 Dorsalgia, unspecified: Secondary | ICD-10-CM

## 2012-05-29 DIAGNOSIS — M47816 Spondylosis without myelopathy or radiculopathy, lumbar region: Secondary | ICD-10-CM

## 2012-05-29 DIAGNOSIS — Z0000 Encounter for general adult medical examination without abnormal findings: Secondary | ICD-10-CM | POA: Insufficient documentation

## 2012-05-29 HISTORY — DX: Pain in right knee: M25.561

## 2012-05-29 MED ORDER — IBUPROFEN 600 MG PO TABS: 600.0000 mg | ORAL_TABLET | Freq: Three times a day (TID) | ORAL | Status: DC | PRN

## 2012-05-29 MED ORDER — TRAMADOL HCL 50 MG PO TABS: 50.0000 mg | ORAL_TABLET | Freq: Three times a day (TID) | ORAL | Status: DC | PRN

## 2012-05-29 NOTE — Assessment & Plan Note (Addendum)
Influenza vaccine given today.  Health maintenance updated; patient is not a candidate for cervical cancer screening following TAH.  It is documented that she received a colonoscopy in 2009, but this is unconfirmed.  Consider referral for colonoscopy.

## 2012-05-29 NOTE — Patient Instructions (Addendum)
Start taking tramadol and ibuprofen for your pain.  Do not take additional ibuprofen (Motron, Advil, etc.) while taking the prescription ibuprofen.  You can take Tylenol for the pain too.  Remember not to take more than 3,000mg  of acetaminophen in 24 hours.  Tylenol and many other medications have acetaminophen in them; so make sure that the total dose from all sources does not add up to more than 4,000mg .  We will schedule you for an MRI of your spine and then we will see you back in clinic soon.

## 2012-05-29 NOTE — Progress Notes (Signed)
Subjective:    Patient ID: [name], female    DOB: [birth year], 56 y.o.   MRN: [id number]  CC: back and knee pain  HPI:  This is a 56 year old woman with a history of lumbar spondylosis and body aches presents today complaining of back pain and now right knee pain.  Back Pain She complains of constant back pain with occasional exacerbations of this pain. Onset was over a year ago. The exacerbations or "spasms" may be provoked by periods of prolonged standing. The quality is described as a "pulling" sensation and as both aching and stabbing. The location is bilaterally in the paraspinal musculature of the lumbar spine, and there is no radiation of the pain. Severity is a 7/10 constantly with the "spasm" episodes being 12/10.  The "spasms" episodes occur about twice a month and they will last for 2-3 days.  Tylenol Arthritis has been tried with partial improvement.  There are no associated symptoms.  Right Knee Pain Onset was 3 to 4 months ago.  No provoking injury or circumstance can be identified. Quality is throbbing. There is no radiation. Severity is 6/10. The pain is constant. Walking and climbing stairs makes the pain worse.  Tylenol Arthritis eases the pain.  It is associated with occasional "popping" sensations, but these popping sensations do not make the pain worse or better.  There are no other associated symptoms.  On review of systems, she complains of some chest pain recently that is now improving but is related to emotion and being upset over recent life stressors.  She also complains of some urinary incontinence with both urge incontinence and some stress incontinence; it has been a problem for about as long as she has had back pain.  She also admits that sometimes, even after wiping until clean, she has stool streaked on her underwear after a BM.  She also affirms intermittent saddle paraesthesias and numbness in her legs bilaterally.  These symptoms are intermittent and not  related to any particular activity or body position and they are not related to temporally to the back pain.    Review of Systems: CARDIOVASCULAR: positive for chest pain RESPIRATORY: negative for dyspnea GASTROINTESTINAL: positive for bowel incontinence; negative for nausea, vomiting, diarrhea, constipation, abdominal pain, and hematochezia GENITOURINARY: positive for urinary incontinence; negative for dysuria and hematuria MUSCULOSKELETAL: positive for right knee pain; negative for other arthralgias and myalgias NEUROLOGICAL: positive for saddle paraesthesia and intermittent lower extremity paraesthesia        Objective:   Physical Exam GENERAL: obese; no acute distress HEAD: atraumatic, normocephalic EYES: pupils equal, round and reactive; sclera anicteric; normal conjunctiva EARS: canals and TMs normal bilaterally NOSE/THROAT: oropharynx clear, moist mucous membranes NECK: supple LYMPH: no cervical or supraclavicular lymphadenopathy LUNGS: clear to auscultation bilaterally, normal work of breathing HEART: bradycardia and regular rhythm; normal S1 and S2 without S3 or S4; no murmurs, rubs, or clicks ABDOMEN: soft, non-tender, normal bowel sounds GU: external hemorrhoid appreciated but non-tender and non-erythematous, normal appearing perineum, normal rectal tone, hemoccult negative, no masses palpated in the rectal vault  MSK: full range of motion of the back, hip, knees, and ankles; increased pain with active lateral flexion and active rotation of the back; increased pain with passive flexion and extension of the right knee; no effusion, erythema, warmth, or crepitus appreciated in the right knee; no step offs or deformities appreciated in the spine; tenderness with palpation of the paraspinal musculature bilaterally in the lumbar back; straight leg test  negative bilaterally MOTOR: full and symmetric strength with plantarflexion, dorsiflexion, leg extension, and leg  flexion SENSATION: intact in the lower extremities   Filed Vitals:   05/29/12 0915  BP: 148/77  Pulse: 52  Temp: 96.7 F (35.9 C)          Assessment & Plan:

## 2012-05-29 NOTE — Assessment & Plan Note (Addendum)
Back pain is stable but there are concerning warning signs including urinary incontinence, intermittent saddle paraesthesias, intermittent lower extremity paraesthesias, and possible stool incontinence.  These symptoms are all worrisome for spondylosis complicated by myelopathy.  I do not suspect radiculopathy as [name] does not complain of pain radiating into the thigh, leg or feet and straight leg raise is negative.  We have ordered a lumbar MRI for 05/30/2012 and we will provide oral analgesics to manage her pain.  As an afterthought, a muscle relaxant like cyclobenzaprine might be useful to manage the "spasm" episodes [name] suffers from 2 to 3 times a month. - lumbar MRI tomorrow - ibuprofen 600mg  TID for the next two weeks - tramadol 50mg  TID PRN for pain - consider cyclobenzaprine PRN  ADDENDUM:  MRI results demonstrate L5/S1 disk protrusion with mass effect on the thecal sac and S1 nerve root as well as foraminal encroachment.  This may explain her symptoms.  Referral to neurosurgery will be made.

## 2012-05-29 NOTE — Assessment & Plan Note (Signed)
Symptoms and exam are consistent with osteoarthritis.  This is a chronic monoarthritis without associated joint swelling, erythema, or warmth.  Rheumatologic and infectious processes are unlikely.  She does have a history of gout arthritis, but I do not suspect this given the chronicity.  I encouraged her to loose weight as this can slow the progression of osteoarthritis in the knee and we will manage her pain with the same analgesics prescribed for her back pain.  Once her lumbar spondylosis is adequately evaluated, we may consider referral to PM&R or sports medicine for a more comprehensive approach to this pain. - tramadol 50mg  TID PRN - ibuprofen 600mg  TID for 2 weeks

## 2012-05-30 ENCOUNTER — Ambulatory Visit (HOSPITAL_COMMUNITY)
Admission: RE | Admit: 2012-05-30 | Discharge: 2012-05-30 | Disposition: A | Discharge status: Home / Self Care | Payer: Medicare Other | Source: Ambulatory Visit | Attending: Internal Medicine | Admitting: Internal Medicine

## 2012-05-30 ENCOUNTER — Telehealth: Payer: Self-pay | Admitting: Internal Medicine

## 2012-05-30 DIAGNOSIS — M549 Dorsalgia, unspecified: Secondary | ICD-10-CM | POA: Insufficient documentation

## 2012-05-30 NOTE — Progress Notes (Signed)
agree

## 2012-05-30 NOTE — Progress Notes (Signed)
Quick Note:  MRI of the lumbar spine demonstrated real disease affecting the sacral nerve roots in particular, which may explain the neurological deficits she has been experiencing. We will refer her to neurosurgery for evaluation. ______

## 2012-05-30 NOTE — Addendum Note (Signed)
Addended by: Gwynn Burly B on: 05/30/2012 04:38 PM   Modules accepted: Orders

## 2012-05-30 NOTE — Telephone Encounter (Signed)
Called patient and relayed lumbar MRI findings from November 6.  Also suggested that we refer her to neurosurgery.  She agrees with this plan and has no questions.

## 2012-06-13 ENCOUNTER — Ambulatory Visit: Payer: Medicare Other | Attending: Neurosurgery | Admitting: Physical Therapy

## 2012-06-13 DIAGNOSIS — M545 Low back pain, unspecified: Secondary | ICD-10-CM | POA: Insufficient documentation

## 2012-06-13 DIAGNOSIS — IMO0001 Reserved for inherently not codable concepts without codable children: Secondary | ICD-10-CM | POA: Insufficient documentation

## 2012-06-18 ENCOUNTER — Ambulatory Visit: Payer: Medicare Other | Admitting: Physical Therapy

## 2012-06-19 ENCOUNTER — Ambulatory Visit: Payer: Medicare Other | Admitting: Physical Therapy

## 2012-06-25 ENCOUNTER — Ambulatory Visit: Payer: Medicare Other | Attending: Neurosurgery | Admitting: Physical Therapy

## 2012-06-25 DIAGNOSIS — M545 Low back pain, unspecified: Secondary | ICD-10-CM | POA: Insufficient documentation

## 2012-06-25 DIAGNOSIS — IMO0001 Reserved for inherently not codable concepts without codable children: Secondary | ICD-10-CM | POA: Insufficient documentation

## 2012-06-28 ENCOUNTER — Ambulatory Visit: Payer: Medicare Other | Admitting: Rehabilitation

## 2012-07-03 ENCOUNTER — Encounter: Payer: Medicare Other | Admitting: Physical Therapy

## 2012-07-06 ENCOUNTER — Encounter: Payer: Medicare Other | Admitting: Rehabilitation

## 2012-07-09 ENCOUNTER — Ambulatory Visit: Payer: Medicare Other | Admitting: Physical Therapy

## 2012-07-11 ENCOUNTER — Ambulatory Visit: Payer: Medicare Other | Admitting: Physical Therapy

## 2012-07-23 ENCOUNTER — Emergency Department (HOSPITAL_COMMUNITY)
Admission: EM | Admit: 2012-07-23 | Discharge: 2012-07-23 | Disposition: A | Discharge status: Home / Self Care | Payer: Medicare Other | Attending: Emergency Medicine | Admitting: Emergency Medicine

## 2012-07-23 ENCOUNTER — Emergency Department (HOSPITAL_COMMUNITY): Payer: Medicare Other

## 2012-07-23 ENCOUNTER — Encounter (HOSPITAL_COMMUNITY): Payer: Self-pay | Admitting: *Deleted

## 2012-07-23 DIAGNOSIS — Z8739 Personal history of other diseases of the musculoskeletal system and connective tissue: Secondary | ICD-10-CM | POA: Insufficient documentation

## 2012-07-23 DIAGNOSIS — Z8639 Personal history of other endocrine, nutritional and metabolic disease: Secondary | ICD-10-CM | POA: Insufficient documentation

## 2012-07-23 DIAGNOSIS — Z8659 Personal history of other mental and behavioral disorders: Secondary | ICD-10-CM | POA: Insufficient documentation

## 2012-07-23 DIAGNOSIS — R0789 Other chest pain: Secondary | ICD-10-CM

## 2012-07-23 DIAGNOSIS — Z872 Personal history of diseases of the skin and subcutaneous tissue: Secondary | ICD-10-CM | POA: Insufficient documentation

## 2012-07-23 DIAGNOSIS — K219 Gastro-esophageal reflux disease without esophagitis: Secondary | ICD-10-CM | POA: Insufficient documentation

## 2012-07-23 DIAGNOSIS — R071 Chest pain on breathing: Secondary | ICD-10-CM | POA: Insufficient documentation

## 2012-07-23 DIAGNOSIS — Z8679 Personal history of other diseases of the circulatory system: Secondary | ICD-10-CM | POA: Insufficient documentation

## 2012-07-23 DIAGNOSIS — Z862 Personal history of diseases of the blood and blood-forming organs and certain disorders involving the immune mechanism: Secondary | ICD-10-CM | POA: Insufficient documentation

## 2012-07-23 DIAGNOSIS — F17200 Nicotine dependence, unspecified, uncomplicated: Secondary | ICD-10-CM | POA: Insufficient documentation

## 2012-07-23 DIAGNOSIS — Z79899 Other long term (current) drug therapy: Secondary | ICD-10-CM | POA: Insufficient documentation

## 2012-07-23 LAB — CBC WITH DIFFERENTIAL/PLATELET
Basophils Absolute: 0 10*3/uL (ref 0.0–0.1)
Lymphocytes Relative: 44 % (ref 12–46)
Neutro Abs: 1.5 10*3/uL — ABNORMAL LOW (ref 1.7–7.7)
Platelets: 207 10*3/uL (ref 150–400)
RBC: 4.14 MIL/uL (ref 3.87–5.11)
RDW: 13.8 % (ref 11.5–15.5)
WBC: 3.3 10*3/uL — ABNORMAL LOW (ref 4.0–10.5)

## 2012-07-23 LAB — BASIC METABOLIC PANEL WITH GFR
BUN: 13 mg/dL (ref 6–23)
CO2: 29 meq/L (ref 19–32)
Calcium: 9 mg/dL (ref 8.4–10.5)
Chloride: 100 meq/L (ref 96–112)
Creatinine, Ser: 0.82 mg/dL (ref 0.50–1.10)
GFR calc Af Amer: >90 mL/min
GFR calc non Af Amer: 78 mL/min — ABNORMAL LOW
Glucose, Bld: 104 mg/dL — ABNORMAL HIGH (ref 70–99)
Potassium: 3.8 meq/L (ref 3.5–5.1)
Sodium: 138 meq/L (ref 135–145)

## 2012-07-23 LAB — D-DIMER, QUANTITATIVE: D-Dimer, Quant: <0.27 ug{FEU}/mL (ref 0.00–0.48)

## 2012-07-23 LAB — TROPONIN I: Troponin I: <0.3 ng/mL (ref <0.30)

## 2012-07-23 MED ORDER — IBUPROFEN 800 MG PO TABS: 800.0000 mg | ORAL_TABLET | Freq: Three times a day (TID) | ORAL | Status: DC

## 2012-07-23 MED ORDER — IBUPROFEN 800 MG PO TABS
800.0000 mg | ORAL_TABLET | Freq: Once | ORAL | Status: AC
  Administered 2012-07-23: 800 mg via ORAL
  Filled 2012-07-23: qty 1

## 2012-07-23 NOTE — ED Notes (Signed)
Patient back from x-ray 

## 2012-07-23 NOTE — ED Provider Notes (Signed)
History     CSN: [id number]  Arrival date & time 07/23/12  0932   First MD Initiated Contact with Patient 07/23/12 [phone]      Chief Complaint  Patient presents with  . Chest Pain    (Consider location/radiation/quality/duration/timing/severity/associated sxs/prior treatment) HPI Comments: Patient presents with 3 days of right-sided chest pain is constant and worse with movement of her chest. She reports no injury, lifting or twisting. She denies shortness of breath, nausea, vomiting, diaphoresis, fever chills. No cough. She reports having a negative stress test several years ago. No personal cardiac history. The pain is better with lying still, worse with movement. Is constant and nonradiating.  The history is provided by the patient.    Past Medical History  Diagnosis Date  . Ecchymoses, spontaneous 08/12/2010  . Weight gain 08/12/2010  . Fatigue 08/12/2010  . Smoker 05/10/2010  . Bursitis of right shoulder 05/10/2010  . Inguinal pain 03/23/2010    right  . Shoulder pain, right 07/07/2009  . Mole (skin) 07/07/2009  . Back pain 07/07/2009  . Depression 05/15/2006  . GERD (gastroesophageal reflux disease) 05/15/2006  . Bradycardia 05/15/2006  . Personal history of goiter 05/15/2006    Past Surgical History  Procedure Date  . Abdominal hysterectomy     No family history on file.  History  Substance Use Topics  . Smoking status: Current Some Day Smoker    Last Attempt to Quit: 05/25/2006  . Smokeless tobacco: Not on file  . Alcohol Use: No    OB History    Grav Para Term Preterm Abortions TAB SAB Ect Mult Living                  Review of Systems  Constitutional: Negative for fever.  Respiratory: Negative for cough and shortness of breath.   Cardiovascular: Positive for chest pain.  A complete 10 system review of systems was obtained and all systems are negative except as noted in the HPI and PMH.    Allergies  Aspirin  Home Medications   Current  Outpatient Rx  Name  Route  Sig  Dispense  Refill  . FLUOXETINE HCL 40 MG PO CAPS   Oral   Take 40 mg by mouth daily.         . ADULT MULTIVITAMIN W/MINERALS CH   Oral   Take 1 tablet by mouth daily.         . OLOPATADINE HCL 0.2 % OP SOLN   Both Eyes   Place 1 drop into both eyes 2 (two) times daily.         Marland Kitchen OMEPRAZOLE 20 MG PO CPDR      take 1 capsule by mouth twice a day   60 capsule   1     BP 128/78  Pulse 80  Temp 97.9 F (36.6 C)  Resp 16  SpO2 99%  LMP 11/08/1991  Physical Exam  Constitutional: She is oriented to person, place, and time. She appears well-developed and well-nourished. No distress.  HENT:  Head: Normocephalic and atraumatic.  Mouth/Throat: Oropharynx is clear and moist. No oropharyngeal exudate.  Eyes: Conjunctivae normal and EOM are normal. Pupils are equal, round, and reactive to light.  Neck: Normal range of motion. Neck supple.  Cardiovascular: Normal rate, regular rhythm and normal heart sounds.   No murmur heard. Pulmonary/Chest: Effort normal and breath sounds normal. No respiratory distress. She exhibits tenderness.       R sided chest tender, worse with movement  Abdominal: Soft. There is no tenderness. There is no rebound and no guarding.  Musculoskeletal: Normal range of motion. She exhibits no edema and no tenderness.  Neurological: She is alert and oriented to person, place, and time. No cranial nerve deficit. She exhibits normal muscle tone. Coordination normal.       Equal grip strengths bilaterally  Skin: Skin is warm.    ED Course  Procedures (including critical care time)  Labs Reviewed  CBC WITH DIFFERENTIAL - Abnormal; Notable for the following:    WBC 3.3 (*)     Neutro Abs 1.5 (*)     All other components within normal limits  BASIC METABOLIC PANEL - Abnormal; Notable for the following:    Glucose, Bld 104 (*)     GFR calc non Af Amer 78 (*)     All other components within normal limits  D-DIMER,  QUANTITATIVE  TROPONIN I   Dg Chest 2 View  07/23/2012  *RADIOLOGY REPORT*  Clinical Data: The chest pain  CHEST - 2 VIEW  Comparison: 01/01/2005  Findings: The lungs are clear without focal consolidation, edema, effusion or pneumothorax.  Interstitial markings are diffusely coarsened with chronic features. Cardiopericardial silhouette is within normal limits for size.  Imaged bony structures of the thorax are intact.  IMPRESSION: No acute cardiopulmonary findings.   Original Report Authenticated By: Kennith Center, M.D.      No diagnosis found.    MDM  Constant right-sided chest pain for 3 days, worse with movement. Atypical for angina. Vital stable EKG nonischemic.    EKG negative. Pain is improved with anti-inflammatories. D-dimer negative, troponin negative. Pain is very atypical for ACS. We'll treat for chest wall pain and have followup with PCP for stress test.    Date: 07/23/2012  Rate: 55  Rhythm: normal sinus rhythm  QRS Axis: normal  Intervals: normal  ST/T Wave abnormalities: normal  Conduction Disutrbances:none  Narrative Interpretation:   Old EKG Reviewed: unchanged          Glynn Octave, MD 07/23/12 1549

## 2012-07-23 NOTE — ED Notes (Signed)
Pt reports right sided chest pain x 3 days. Reports worse with movement. Denies shortness of breath, nausea/vomiting. Reports dizziness at times.

## 2012-07-23 NOTE — ED Notes (Signed)
Patient transported to X-ray 

## 2012-07-23 NOTE — ED Notes (Signed)
Patient complains of chest pain times 4 days she advises that it is worse with movement. Pain is mid sternal and radiates into the right arm, Patient states that she was diaphortic last pm.Denies nausea and vomiting.

## 2012-07-23 NOTE — ED Notes (Signed)
Patient discharged with instructions with her husband. Teach back method used for instructions. Patient verbalized an understanding

## 2012-07-26 ENCOUNTER — Ambulatory Visit: Payer: Medicare Other | Admitting: Internal Medicine

## 2012-08-01 ENCOUNTER — Encounter: Payer: Self-pay | Admitting: Internal Medicine

## 2012-08-01 ENCOUNTER — Ambulatory Visit (INDEPENDENT_AMBULATORY_CARE_PROVIDER_SITE_OTHER): Payer: Medicare Other | Admitting: Internal Medicine

## 2012-08-01 VITALS — BP 114/71 | HR 56 | Temp 97.2°F | Resp 20 | Ht 67.0 in | Wt 201.1 lb

## 2012-08-01 DIAGNOSIS — Z1231 Encounter for screening mammogram for malignant neoplasm of breast: Secondary | ICD-10-CM

## 2012-08-01 DIAGNOSIS — K219 Gastro-esophageal reflux disease without esophagitis: Secondary | ICD-10-CM

## 2012-08-01 DIAGNOSIS — M47817 Spondylosis without myelopathy or radiculopathy, lumbosacral region: Secondary | ICD-10-CM

## 2012-08-01 DIAGNOSIS — Z0000 Encounter for general adult medical examination without abnormal findings: Secondary | ICD-10-CM

## 2012-08-01 DIAGNOSIS — R0789 Other chest pain: Secondary | ICD-10-CM | POA: Insufficient documentation

## 2012-08-01 DIAGNOSIS — M47816 Spondylosis without myelopathy or radiculopathy, lumbar region: Secondary | ICD-10-CM

## 2012-08-01 MED ORDER — TRAMADOL HCL 50 MG PO TABS: 50.0000 mg | ORAL_TABLET | Freq: Two times a day (BID) | ORAL | Status: DC | PRN

## 2012-08-01 MED ORDER — OMEPRAZOLE 20 MG PO CPDR: 20.0000 mg | DELAYED_RELEASE_CAPSULE | Freq: Every day | ORAL | Status: DC

## 2012-08-01 NOTE — Assessment & Plan Note (Addendum)
Patient has evidence of spondylitic changes and is doing well on NSAIDs and physical therapy. She was seen by neurosurgery in November 2013. They state that although she has evidence of bulging disc, she does not have a clear radiculopathy, therefore this is not a surgical problem at this time. She does not complaining of any numbness or tingling or subtle anesthesia.  -Discussed the importance of using Tylenol throughout the day, no more than 3-4 g in 24-hour period. She can also use ibuprofen. -tramadol 50mg  BID PRN given for breakthrough pain only. She agreed to use this medication sparingly. -Continue this other pain

## 2012-08-01 NOTE — Assessment & Plan Note (Signed)
GERD symptoms are well-controlled with Prilosec 20 mg daily. Refills given for this medication at this visit.

## 2012-08-01 NOTE — Progress Notes (Signed)
Internal Medicine Clinic Visit    HPI:  [name] is a 57 y.o. year old female with a history of GERD, depression, osteoarthritis, who presents with any followup for chest pain. Patient was seen in the ED on 07/23/2012 for atypical chest pain. EKG without ischemic changes, pain was thought to be musculoskeletal.  Today, she states her chest pain is completely better. She was using ibuprofen as recommended by the ED physician.  She also states that she has been going to physical therapy for her back and knee pain, which has helped as well. MRI with L5/S1 disc protrusion, however, patient does not have symptoms of S1 radiculopathy. She was seen by neurosurgery back in November and they recommended NSAIDs, weight loss, core exercises to treat her spondylitic changes.  Still having pain in L shoulder. Asks for tramadol refill. Takes tramadol twice per day as the OTC arthritis medicine will not relieve pain from her shoulder.  Denies fever, chills, shortness of breath, chest pain, numbness and tingling.  Past medical history reviewed.   Past Medical History  Diagnosis Date  . Ecchymoses, spontaneous 08/12/2010  . Weight gain 08/12/2010  . Fatigue 08/12/2010  . Smoker 05/10/2010  . Bursitis of right shoulder 05/10/2010  . Inguinal pain 03/23/2010    right  . Shoulder pain, right 07/07/2009  . Mole (skin) 07/07/2009  . Back pain 07/07/2009  . Depression 05/15/2006  . GERD (gastroesophageal reflux disease) 05/15/2006  . Bradycardia 05/15/2006  . Personal history of goiter 05/15/2006    Past Surgical History  Procedure Date  . Abdominal hysterectomy      ROS:  A complete review of systems was otherwise negative, except as noted in the HPI.  Allergies: Aspirin  Medications: Current Outpatient Prescriptions  Medication Sig Dispense Refill  . FLUoxetine (PROZAC) 40 MG capsule Take 40 mg by mouth daily.      Marland Kitchen ibuprofen (ADVIL,MOTRIN) 800 MG tablet Take 1 tablet (800 mg total)  by mouth 3 (three) times daily.  21 tablet  0  . Multiple Vitamin (MULTIVITAMIN WITH MINERALS) TABS Take 1 tablet by mouth daily.      . Olopatadine HCl (PATADAY) 0.2 % SOLN Place 1 drop into both eyes 2 (two) times daily.      Marland Kitchen omeprazole (PRILOSEC) 20 MG capsule take 1 capsule by mouth twice a day  60 capsule  1    History   Social History  . Marital Status: Legally Separated    Spouse Name: N/A    Number of Children: N/A  . Years of Education: N/A   Occupational History  . Not on file.   Social History Main Topics  . Smoking status: Current Some Day Smoker -- 0.1 packs/day    Types: Cigarettes    Last Attempt to Quit: 05/25/2006  . Smokeless tobacco: Not on file     Comment: maybe 1 cigarette a week  . Alcohol Use: No  . Drug Use: No  . Sexually Active: Not on file   Other Topics Concern  . Not on file   Social History Narrative  . No narrative on file    family history is not on file.  Physical Exam Blood pressure 114/71, pulse 56, temperature 97.2 F (36.2 C), temperature source Oral, resp. rate 20, height 5\' 7"  (1.702 m), weight 201 lb 1.6 oz (91.218 kg), last menstrual period 11/08/1991, SpO2 96.00%. General:  No acute distress, alert and oriented x 3, well-appearing  HEENT:  PERRL, EOMI, no lymphadenopathy, moist  mucous membranes Cardiovascular:  Regular rate and rhythm, no murmurs, rubs or gallops Respiratory:  Clear to auscultation bilaterally, no wheezes, rales, or rhonchi Abdomen:  Soft, nondistended, nontender, normoactive bowel sounds Extremities:  Warm and well-perfused, no clubbing, cyanosis, or edema.  Skin: Warm, dry, no rashes Neuro: Not anxious appearing, no depressed mood, normal affect MSK: Full active and passive range of motion of both shoulders. Some pain with active range of motion. Strength is intact, symmetric. Sensation intact throughout.  Labs: Lab Results  Component Value Date   CREATININE 0.82 07/23/2012   BUN 13 07/23/2012   NA  138 07/23/2012   K 3.8 07/23/2012   CL 100 07/23/2012   CO2 29 07/23/2012   Lab Results  Component Value Date   WBC 3.3* 07/23/2012   HGB 12.1 07/23/2012   HCT 37.8 07/23/2012   MCV 91.3 07/23/2012   PLT 207 07/23/2012      Assessment and Plan:    FOLLOWUP: [name] will follow back up in our clinic in approximately  3-4 months. [name] knows to call out clinic in the meantime with any questions or new issues.

## 2012-08-01 NOTE — Patient Instructions (Addendum)
Please return to clinic in 3-4 months, sooner if needed.

## 2012-08-01 NOTE — Assessment & Plan Note (Addendum)
Patient continues to have mild atypical chest pain. Most likely musculoskeletal as this has greatly improved on ibuprofen therapy.   -Continue ibuprofen when necessary

## 2012-08-01 NOTE — Assessment & Plan Note (Signed)
Patient is not up to date on her mammogram, therefore we will refer her at this office visit.  Flu shot given at last visit.  Patient states that she did have a colonoscopy in 2009, one polyp was removed. She does not remember her gastroenterologist or with the office was located. Records are not available in her paper chart. Will likely need another colonoscopy in 2014.

## 2012-08-27 ENCOUNTER — Ambulatory Visit (HOSPITAL_COMMUNITY): Payer: Medicare Other

## 2012-08-28 ENCOUNTER — Ambulatory Visit (HOSPITAL_COMMUNITY)
Admission: RE | Admit: 2012-08-28 | Discharge: 2012-08-28 | Disposition: A | Discharge status: Home / Self Care | Payer: Medicare Other | Source: Ambulatory Visit | Attending: Internal Medicine | Admitting: Internal Medicine

## 2012-08-28 DIAGNOSIS — Z0000 Encounter for general adult medical examination without abnormal findings: Secondary | ICD-10-CM

## 2012-08-28 DIAGNOSIS — Z1231 Encounter for screening mammogram for malignant neoplasm of breast: Secondary | ICD-10-CM | POA: Insufficient documentation

## 2013-01-18 ENCOUNTER — Ambulatory Visit (INDEPENDENT_AMBULATORY_CARE_PROVIDER_SITE_OTHER): Payer: Medicare Other | Admitting: Internal Medicine

## 2013-01-18 ENCOUNTER — Encounter: Payer: Self-pay | Admitting: Internal Medicine

## 2013-01-18 VITALS — BP 118/76 | HR 70 | Temp 97.9°F | Ht 67.0 in | Wt 199.7 lb

## 2013-01-18 DIAGNOSIS — M25569 Pain in unspecified knee: Secondary | ICD-10-CM

## 2013-01-18 DIAGNOSIS — M47816 Spondylosis without myelopathy or radiculopathy, lumbar region: Secondary | ICD-10-CM

## 2013-01-18 DIAGNOSIS — M47817 Spondylosis without myelopathy or radiculopathy, lumbosacral region: Secondary | ICD-10-CM

## 2013-01-18 DIAGNOSIS — M25561 Pain in right knee: Secondary | ICD-10-CM | POA: Insufficient documentation

## 2013-01-18 DIAGNOSIS — K219 Gastro-esophageal reflux disease without esophagitis: Secondary | ICD-10-CM

## 2013-01-18 MED ORDER — OMEPRAZOLE 20 MG PO CPDR: 20.0000 mg | DELAYED_RELEASE_CAPSULE | Freq: Two times a day (BID) | ORAL | Status: DC

## 2013-01-18 MED ORDER — TRAMADOL HCL 50 MG PO TABS: 50.0000 mg | ORAL_TABLET | Freq: Two times a day (BID) | ORAL | Status: DC | PRN

## 2013-01-18 NOTE — Patient Instructions (Signed)
General Instructions: Your knee pain may have been due to arthritis.  I'm glad that it has resolved.  If the pain returns, I've refilled your tramadol.  We've increased your omeprazole to twice per day, as discussed.   Please return for a follow-up visit in 6 months.   Treatment Goals:  Goals (1 Years of Data) as of 01/18/13     Lifestyle    . Eat breakfast       Progress Toward Treatment Goals:  Treatment Goal 01/18/2013  Stop smoking smoking the same amount    Self Care Goals & Plans:  Self Care Goal 01/18/2013  Manage my medications take my medicines as prescribed; bring my medications to every visit  Be physically active -  Stop smoking call [facility] (1-800-QUIT-NOW)  Other -       Care Management & Community Referrals:  Referral 01/18/2013  Referrals made for care management support none needed

## 2013-01-18 NOTE — Progress Notes (Signed)
HPI The patient is a 57 y.o. female, presenting for an acute visit for knee pain.  The patient notes a 3-week history of right knee pain.  She notes that the pain started gradually with no preceding trauma.  The patient attributed it to arthritis, but the pain steadily increased.  Pain is described as involving the entire knee, present a sharp pain, worse with movement and weight bearing, 10/10 in severity.  She notes mild swelling, but no warmth, erythema, or fevers.  The pain started improving 4 days ago, and had resolved altogether 2 days ago, though still with some mild residual soreness.  The patient took tylenol and ibuprofen for the pain, which helped somewhat.  The patient has a history of gout, diagnosed 1 year ago, which started as right wrist pain.  She does not believe she had joint aspiration at that time.  She notes occasional flares in her right wrist, but has never had it at any other site.  ROS: General: no fevers, chills, changes in weight, changes in appetite Skin: no rash HEENT: no blurry vision, hearing changes, sore throat Pulm: no dyspnea, coughing, wheezing CV: no chest pain, palpitations, shortness of breath Abd: no abdominal pain, nausea/vomiting, diarrhea/constipation GU: no dysuria, hematuria, polyuria Ext: see HPI Neuro: no weakness, numbness, or tingling  Filed Vitals:   01/18/13 0954  BP: 118/76  Pulse: 70  Temp: 97.9 F (36.6 C)    PEX General: alert, cooperative, and in no apparent distress HEENT: pupils equal round and reactive to light, vision grossly intact, oropharynx clear and non-erythematous  Neck: supple, no lymphadenopathy Lungs: clear to ascultation bilaterally, normal work of respiration, no wheezes, rales, ronchi Heart: regular rate and rhythm, no murmurs, gallops, or rubs Abdomen: soft, non-tender, non-distended, normal bowel sounds Extremities: no cyanosis, clubbing, or edema.  Right knee with mild crepitus on passive movement, but no  tenderness to palpation, normal ROM, and no pain on anterior/posterior drawer tests or McMurray's tests Neurologic: alert & oriented X3, cranial nerves II-XII intact, strength grossly intact, sensation intact to light touch  Current Outpatient Prescriptions on File Prior to Visit  Medication Sig Dispense Refill  . acetaminophen (TYLENOL) 500 MG tablet Take 500 mg by mouth every 6 (six) hours as needed.      Marland Kitchen FLUoxetine (PROZAC) 40 MG capsule Take 40 mg by mouth daily.      Marland Kitchen ibuprofen (ADVIL,MOTRIN) 800 MG tablet Take 1 tablet (800 mg total) by mouth 3 (three) times daily.  21 tablet  0  . Multiple Vitamin (MULTIVITAMIN WITH MINERALS) TABS Take 1 tablet by mouth daily.      . Olopatadine HCl (PATADAY) 0.2 % SOLN Place 1 drop into both eyes 2 (two) times daily.      Marland Kitchen omeprazole (PRILOSEC) 20 MG capsule Take 1 capsule (20 mg total) by mouth daily.  60 capsule  3  . traMADol (ULTRAM) 50 MG tablet Take 1 tablet (50 mg total) by mouth every 12 (twelve) hours as needed for pain.  60 tablet  0   No current facility-administered medications on file prior to visit.    Assessment/Plan

## 2013-01-18 NOTE — Assessment & Plan Note (Signed)
The patient notes a 3-week history of R knee pain, which has now resolved.  Physical exam reveals only crepitus.  Her pain was likely due to osteoarthritis.  She has a listed history of gout, but does not believe she has ever had arthrocentesis, so I'm unclear whether this diagnosis is accurate. -refilled tramadol, prn for pain

## 2013-01-19 NOTE — Progress Notes (Signed)
Case discussed with Dr. Brown soon after the resident saw the patient.  We reviewed the resident's history and exam and pertinent patient test results.  I agree with the assessment, diagnosis, and plan of care documented in the resident's note. 

## 2013-04-01 ENCOUNTER — Ambulatory Visit (INDEPENDENT_AMBULATORY_CARE_PROVIDER_SITE_OTHER): Payer: Medicare Other | Admitting: *Deleted

## 2013-04-01 DIAGNOSIS — Z23 Encounter for immunization: Secondary | ICD-10-CM

## 2013-09-12 DIAGNOSIS — F331 Major depressive disorder, recurrent, moderate: Secondary | ICD-10-CM | POA: Diagnosis not present

## 2013-11-11 ENCOUNTER — Ambulatory Visit (INDEPENDENT_AMBULATORY_CARE_PROVIDER_SITE_OTHER): Payer: Medicare Other | Admitting: Internal Medicine

## 2013-11-11 ENCOUNTER — Encounter: Payer: Self-pay | Admitting: Internal Medicine

## 2013-11-11 VITALS — BP 127/72 | HR 58 | Temp 97.6°F | Ht 67.0 in | Wt 192.8 lb

## 2013-11-11 DIAGNOSIS — M549 Dorsalgia, unspecified: Secondary | ICD-10-CM | POA: Diagnosis present

## 2013-11-11 DIAGNOSIS — M545 Low back pain, unspecified: Secondary | ICD-10-CM | POA: Diagnosis not present

## 2013-11-11 DIAGNOSIS — M170 Bilateral primary osteoarthritis of knee: Secondary | ICD-10-CM

## 2013-11-11 DIAGNOSIS — M47817 Spondylosis without myelopathy or radiculopathy, lumbosacral region: Secondary | ICD-10-CM | POA: Diagnosis not present

## 2013-11-11 DIAGNOSIS — IMO0002 Reserved for concepts with insufficient information to code with codable children: Secondary | ICD-10-CM

## 2013-11-11 DIAGNOSIS — F17200 Nicotine dependence, unspecified, uncomplicated: Secondary | ICD-10-CM | POA: Diagnosis not present

## 2013-11-11 DIAGNOSIS — M1710 Unilateral primary osteoarthritis, unspecified knee: Secondary | ICD-10-CM

## 2013-11-11 DIAGNOSIS — Z720 Tobacco use: Secondary | ICD-10-CM

## 2013-11-11 DIAGNOSIS — Z0000 Encounter for general adult medical examination without abnormal findings: Secondary | ICD-10-CM | POA: Diagnosis not present

## 2013-11-11 MED ORDER — TRAMADOL HCL 50 MG PO TABS: 50.0000 mg | ORAL_TABLET | Freq: Four times a day (QID) | ORAL | Status: DC | PRN

## 2013-11-11 NOTE — Progress Notes (Signed)
Case discussed with Dr. McLean soon after the resident saw the patient.  We reviewed the resident's history and exam and pertinent patient test results.  I agree with the assessment, diagnosis and plan of care documented in the resident's note. 

## 2013-11-11 NOTE — Assessment & Plan Note (Addendum)
Crepitus noted likely OA of b/l knees  Rx Ultram 50 mg q6 prn alternate with Tylenol RTC 1 month Consider imaging in the future if needed  Considered PT but pt declines at this time  Pt may need to establish pain contract if going to be on chronic Tramadol

## 2013-11-11 NOTE — Assessment & Plan Note (Signed)
  Assessment: Progress toward smoking cessation:  smoking less (smoking 1 cig per day) Barriers to progress toward smoking cessation:  none (being around other smokers) Comments: none  Plan: Instruction/counseling given:  I counseled patient on the dangers of tobacco use, advised patient to stop smoking, and reviewed strategies to maximize success. Educational resources provided:  [facility] account manager) brochure Self management tools provided:  other (see comments) Medications to assist with smoking cessation:  None Patient agreed to the following self-care plans for smoking cessation: call [facility] (1-800-QUIT-NOW)  Other plans: keep reassessing

## 2013-11-11 NOTE — Assessment & Plan Note (Addendum)
2009 imaging reviewed with degenerative changes lumbar spine and disc protrusion No alarms sx's  Will try Ultram 50 mg q6 prn alt with Tylenol  RTC 1 month Consider PT in the future  Consider re-imaging in the future if not improved.   Pt may need to establish pain contract if going to be on chronic Tramadol

## 2013-11-11 NOTE — Patient Instructions (Addendum)
General Instructions: Please follow up in 1 month, sooner if needed  Read the info below Please try to bring all your medicines next time. This helps [place] take good care of you and stops mistakes from medicines that could hurt you. Alternate Tramadol with Tylenol Consider physical therapy   Treatment Goals:  Goals (1 Years of Data) as of 11/11/13     Lifestyle    . Eat breakfast       Progress Toward Treatment Goals:  Treatment Goal 11/11/2013  Stop smoking smoking less    Self Care Goals & Plans:  Self Care Goal 11/11/2013  Manage my medications take my medicines as prescribed; bring my medications to every visit; refill my medications on time  Monitor my health keep track of my blood pressure  Eat healthy foods drink diet soda or water instead of juice or soda; eat more vegetables; eat foods that are low in salt; eat baked foods instead of fried foods; eat fruit for snacks and desserts; eat smaller portions  Be physically active find an activity I enjoy  Stop smoking call [facility] (1-800-QUIT-NOW)  Other -  Meeting treatment goals maintain the current self-care plan    No flowsheet data found.   Care Management & Community Referrals:  Referral 11/11/2013  Referrals made for care management support none needed  Referrals made to community resources none       Knee Pain Knee pain can be a result of an injury or other medical conditions. Treatment will depend on the cause of your pain. HOME CARE  Only take medicine as told by your doctor.  Keep a healthy weight. Being overweight can make the knee hurt more.  Stretch before exercising or playing sports.  If there is constant knee pain, change the way you exercise. Ask your doctor for advice.  Make sure shoes fit well. Choose the right shoe for the sport or activity.  Protect your knees. Wear kneepads if needed.  Rest when you are tired. GET HELP RIGHT AWAY IF:   Your knee pain does not stop.  Your knee  pain does not get better.  Your knee joint feels hot to the touch.  You have a fever. MAKE SURE YOU:   Understand these instructions.  Will watch this condition.  Will get help right away if you are not doing well or get worse. Document Released: 10/07/2008 Document Revised: 10/03/2011 Document Reviewed: 10/07/2008 [facility] Patient Information 2014 [place], [place].  Back Pain, Adult Low back pain is very common. About 1 in 5 people have back pain.The cause of low back pain is rarely dangerous. The pain often gets better over time.About half of people with a sudden onset of back pain feel better in just 2 weeks. About 8 in 10 people feel better by 6 weeks.  CAUSES Some common causes of back pain include:  Strain of the muscles or ligaments supporting the spine.  Wear and tear (degeneration) of the spinal discs.  Arthritis.  Direct injury to the back. DIAGNOSIS Most of the time, the direct cause of low back pain is not known.However, back pain can be treated effectively even when the exact cause of the pain is unknown.Answering your caregiver's questions about your overall health and symptoms is one of the most accurate ways to make sure the cause of your pain is not dangerous. If your caregiver needs more information, he or she may order lab work or imaging tests (X-rays or MRIs).However, even if imaging tests show changes in  your back, this usually does not require surgery. HOME CARE INSTRUCTIONS For many people, back pain returns.Since low back pain is rarely dangerous, it is often a condition that people can learn to [facility] their own.   Remain active. It is stressful on the back to sit or stand in one place. Do not sit, drive, or stand in one place for more than 30 minutes at a time. Take short walks on level surfaces as soon as pain allows.Try to increase the length of time you walk each day.  Do not stay in bed.Resting more than 1 or 2 days can delay your  recovery.  Do not avoid exercise or work.Your body is made to move.It is not dangerous to be active, even though your back may hurt.Your back will likely heal faster if you return to being active before your pain is gone.  Pay attention to your body when you bend and lift. Many people have less discomfortwhen lifting if they bend their knees, keep the load close to their bodies,and avoid twisting. Often, the most comfortable positions are those that put less stress on your recovering back.  Find a comfortable position to sleep. Use a firm mattress and lie on your side with your knees slightly bent. If you lie on your back, put a pillow under your knees.  Only take over-the-counter or prescription medicines as directed by your caregiver. Over-the-counter medicines to reduce pain and inflammation are often the most helpful.Your caregiver may prescribe muscle relaxant drugs.These medicines help dull your pain so you can more quickly return to your normal activities and healthy exercise.  Put ice on the injured area.  Put ice in a plastic bag.  Place a towel between your skin and the bag.  Leave the ice on for 15-20 minutes, 03-04 times a day for the first 2 to 3 days. After that, ice and heat may be alternated to reduce pain and spasms.  Ask your caregiver about trying back exercises and gentle massage. This may be of some benefit.  Avoid feeling anxious or stressed.Stress increases muscle tension and can worsen back pain.It is important to recognize when you are anxious or stressed and learn ways to manage it.Exercise is a great option. SEEK MEDICAL CARE IF:  You have pain that is not relieved with rest or medicine.  You have pain that does not improve in 1 week.  You have new symptoms.  You are generally not feeling well. SEEK IMMEDIATE MEDICAL CARE IF:   You have pain that radiates from your back into your legs.  You develop new bowel or bladder control problems.  You  have unusual weakness or numbness in your arms or legs.  You develop nausea or vomiting.  You develop abdominal pain.  You feel faint. Document Released: 07/11/2005 Document Revised: 01/10/2012 Document Reviewed: 11/29/2010 [facility] Patient Information 2014 [place], [place].  Smoking Cessation Quitting smoking is important to your health and has many advantages. However, it is not always easy to quit since nicotine is a very addictive drug. Often times, people try 3 times or more before being able to quit. This document explains the best ways for you to prepare to quit smoking. Quitting takes hard work and a lot of effort, but you can do it. ADVANTAGES OF QUITTING SMOKING  You will live longer, feel better, and live better.  Your body will feel the impact of quitting smoking almost immediately.  Within 20 minutes, blood pressure decreases. Your pulse returns to its normal level.  After 8 hours, carbon monoxide levels in the blood return to normal. Your oxygen level increases.  After 24 hours, the chance of having a heart attack starts to decrease. Your breath, hair, and body stop smelling like smoke.  After 48 hours, damaged nerve endings begin to recover. Your sense of taste and smell improve.  After 72 hours, the body is virtually free of nicotine. Your bronchial tubes relax and breathing becomes easier.  After 2 to 12 weeks, lungs can hold more air. Exercise becomes easier and circulation improves.  The risk of having a heart attack, stroke, cancer, or lung disease is greatly reduced.  After 1 year, the risk of coronary heart disease is cut in half.  After 5 years, the risk of stroke falls to the same as a nonsmoker.  After 10 years, the risk of lung cancer is cut in half and the risk of other cancers decreases significantly.  After 15 years, the risk of coronary heart disease drops, usually to the level of a nonsmoker.  If you are pregnant, quitting smoking will improve  your chances of having a healthy baby.  The people you live with, especially any children, will be healthier.  You will have extra money to spend on things other than cigarettes. QUESTIONS TO THINK ABOUT BEFORE ATTEMPTING TO QUIT You may want to talk about your answers with your caregiver.  Why do you want to quit?  If you tried to quit in the past, what helped and what did not?  What will be the most difficult situations for you after you quit? How will you plan to handle them?  Who can help you through the tough times? Your family? Friends? A caregiver?  What pleasures do you get from smoking? What ways can you still get pleasure if you quit? Here are some questions to ask your caregiver:  How can you help me to be successful at quitting?  What medicine do you think would be best for me and how should I take it?  What should I do if I need more help?  What is smoking withdrawal like? How can I get information on withdrawal? GET READY  Set a quit date.  Change your environment by getting rid of all cigarettes, ashtrays, matches, and lighters in your home, car, or work. Do not let people smoke in your home.  Review your past attempts to quit. Think about what worked and what did not. GET SUPPORT AND ENCOURAGEMENT You have a better chance of being successful if you have help. You can get support in many ways.  Tell your family, friends, and co-workers that you are going to quit and need their support. Ask them not to smoke around you.  Get individual, group, or telephone counseling and support. Programs are available at [employer] and health centers. Call your local health department for information about programs in your area.  Spiritual beliefs and practices may help some smokers quit.  Download a "quit meter" on your computer to keep track of quit statistics, such as how long you have gone without smoking, cigarettes not smoked, and money saved.  Get a self-help  book about quitting smoking and staying off of tobacco. [place] yourself from urges to smoke. Talk to someone, go for a walk, or occupy your time with a task.  Change your normal routine. Take a different route to work. Drink tea instead of coffee. Eat breakfast in a different place.  Reduce  your stress. Take a hot bath, exercise, or read a book.  Plan something enjoyable to do every day. Reward yourself for not smoking.  Explore interactive web-based programs that specialize in helping you quit. GET MEDICINE AND USE IT CORRECTLY Medicines can help you stop smoking and decrease the urge to smoke. Combining medicine with the above behavioral methods and support can greatly increase your chances of successfully quitting smoking.  Nicotine replacement therapy helps deliver nicotine to your body without the negative effects and risks of smoking. Nicotine replacement therapy includes nicotine gum, lozenges, inhalers, nasal sprays, and skin patches. Some may be available over-the-counter and others require a prescription.  Antidepressant medicine helps people abstain from smoking, but how this works is unknown. This medicine is available by prescription.  Nicotinic receptor partial agonist medicine simulates the effect of nicotine in your brain. This medicine is available by prescription. Ask your caregiver for advice about which medicines to use and how to use them based on your health history. Your caregiver will tell you what side effects to look out for if you choose to be on a medicine or therapy. Carefully read the information on the package. Do not use any other product containing nicotine while using a nicotine replacement product.  RELAPSE OR DIFFICULT SITUATIONS Most relapses occur within the first 3 months after quitting. Do not be discouraged if you start smoking again. Remember, most people try several times before finally quitting. You may have symptoms  of withdrawal because your body is used to nicotine. You may crave cigarettes, be irritable, feel very hungry, cough often, get headaches, or have difficulty concentrating. The withdrawal symptoms are only temporary. They are strongest when you first quit, but they will go away within 10 14 days. To reduce the chances of relapse, try to:  Avoid drinking alcohol. Drinking lowers your chances of successfully quitting.  Reduce the amount of caffeine you consume. Once you quit smoking, the amount of caffeine in your body increases and can give you symptoms, such as a rapid heartbeat, sweating, and anxiety.  Avoid smokers because they can make you want to smoke.  Do not let weight gain distract you. Many smokers will gain weight when they quit, usually less than 10 pounds. Eat a healthy diet and stay active. You can always lose the weight gained after you quit.  Find ways to improve your mood other than smoking. FOR MORE INFORMATION  www.smokefree.gov  Document Released: 07/05/2001 Document Revised: 01/10/2012 Document Reviewed: 10/20/2011 [facility] Patient Information 2014 [place], [place].  Fall Prevention and Home Safety Falls cause injuries and can affect all age groups. It is possible to prevent falls.  HOW TO PREVENT FALLS  Wear shoes with rubber soles that do not have an opening for your toes.  Keep the inside and outside of your house well lit.  Use night lights throughout your home.  Remove clutter from floors.  Clean up floor spills.  Remove throw rugs or fasten them to the floor with carpet tape.  Do not place electrical cords across pathways.  Put grab bars by your tub, shower, and toilet. Do not use towel bars as grab bars.  Put handrails on both sides of the stairway. Fix loose handrails.  Do not climb on stools or stepladders, if possible.  Do not wax your floors.  Repair uneven or unsafe sidewalks, walkways, or stairs.  Keep items you use a lot within  reach.  Be aware of pets.  Keep emergency numbers next to the  telephone.  Put smoke detectors in your home and near bedrooms. Ask your doctor what other things you can do to prevent falls. Document Released: 05/07/2009 Document Revised: 01/10/2012 Document Reviewed: 10/11/2011 [facility] Patient Information 2014 [place], [place].  Tramadol tablets What is this medicine? TRAMADOL (TRA ma dole) is a pain reliever. It is used to treat moderate to severe pain in adults. This medicine may be used for other purposes; ask your health care provider or pharmacist if you have questions. COMMON BRAND NAME(S): Ultram What should I tell my health care provider before I take this medicine? They need to know if you have any of these conditions: -brain tumor -depression -drug abuse or addiction -head injury -if you frequently drink alcohol containing drinks -kidney disease or trouble passing urine -liver disease -lung disease, asthma, or breathing problems -seizures or epilepsy -suicidal thoughts, plans, or attempt; a previous suicide attempt by you or a family member -an unusual or allergic reaction to tramadol, codeine, other medicines, foods, dyes, or preservatives -pregnant or trying to get pregnant -breast-feeding How should I use this medicine? Take this medicine by mouth with a full glass of water. Follow the directions on the prescription label. If the medicine upsets your stomach, take it with food or milk. Do not take more medicine than you are told to take. Talk to your pediatrician regarding the use of this medicine in children. Special care may be needed. Overdosage: If you think you have taken too much of this medicine contact a poison control center or emergency room at once. NOTE: This medicine is only for you. Do not share this medicine with others. What if I miss a dose? If you miss a dose, take it as soon as you can. If it is almost time for your next dose, take only that dose.  Do not take double or extra doses. What may interact with this medicine? Do not take this medicine with any of the following medications: -MAOIs like Carbex, Eldepryl, Marplan, Nardil, and Parnate This medicine may also interact with the following medications: -alcohol or medicines that contain alcohol -antihistamines -benzodiazepines -bupropion -carbamazepine or oxcarbazepine -clozapine -cyclobenzaprine -digoxin -furazolidone -linezolid -medicines for depression, anxiety, or psychotic disturbances -medicines for migraine headache like almotriptan, eletriptan, frovatriptan, naratriptan, rizatriptan, sumatriptan, zolmitriptan -medicines for pain like pentazocine, buprenorphine, butorphanol, meperidine, nalbuphine, and propoxyphene -medicines for sleep -muscle relaxants -naltrexone -phenobarbital -phenothiazines like perphenazine, thioridazine, chlorpromazine, mesoridazine, fluphenazine, prochlorperazine, promazine, and trifluoperazine -procarbazine -warfarin This list may not describe all possible interactions. Give your health care provider a list of all the medicines, herbs, non-prescription drugs, or dietary supplements you use. Also tell them if you smoke, drink alcohol, or use illegal drugs. Some items may interact with your medicine. What should I watch for while using this medicine? Tell your doctor or health care professional if your pain does not go away, if it gets worse, or if you have new or a different type of pain. You may develop tolerance to the medicine. Tolerance means that you will need a higher dose of the medicine for pain relief. Tolerance is normal and is expected if you take this medicine for a long time. Do not suddenly stop taking your medicine because you may develop a severe reaction. Your body becomes used to the medicine. This does NOT mean you are addicted. Addiction is a behavior related to getting and using a drug for a non-medical reason. If you have pain,  you have a medical reason to take pain medicine. Your doctor  will tell you how much medicine to take. If your doctor wants you to stop the medicine, the dose will be slowly lowered over time to avoid any side effects. You may get drowsy or dizzy. Do not drive, use machinery, or do anything that needs mental alertness until you know how this medicine affects you. Do not stand or sit up quickly, especially if you are an older patient. This reduces the risk of dizzy or fainting spells. Alcohol can increase or decrease the effects of this medicine. Avoid alcoholic drinks. You may have constipation. Try to have a bowel movement at least every 2 to 3 days. If you do not have a bowel movement for 3 days, call your doctor or health care professional. Your mouth may get dry. Chewing sugarless gum or sucking hard candy, and drinking plenty of water may help. Contact your doctor if the problem does not go away or is severe. What side effects may I notice from receiving this medicine? Side effects that you should report to your doctor or health care professional as soon as possible: -allergic reactions like skin rash, itching or hives, swelling of the face, lips, or tongue -breathing difficulties, wheezing -confusion -itching -light headedness or fainting spells -redness, blistering, peeling or loosening of the skin, including inside the mouth -seizures Side effects that usually do not require medical attention (report to your doctor or health care professional if they continue or are bothersome): -constipation -dizziness -drowsiness -headache -nausea, vomiting This list may not describe all possible side effects. Call your doctor for medical advice about side effects. You may report side effects to FDA at 1-800-FDA-1088. Where should I keep my medicine? Keep out of the reach of children. Store at room temperature between 15 and 30 degrees C (59 and 86 degrees F). Keep container tightly closed. Throw away  any unused medicine after the expiration date. NOTE: This sheet is a summary. It may not cover all possible information. If you have questions about this medicine, talk to your doctor, pharmacist, or health care provider.  2014, [employer]. (2010-03-24 11:55:44)  Arthritis, Nonspecific Arthritis is inflammation of a joint. This usually means pain, redness, warmth or swelling are present. One or more joints may be involved. There are a number of types of arthritis. Your caregiver may not be able to tell what type of arthritis you have right away. CAUSES  The most common cause of arthritis is the wear and tear on the joint (osteoarthritis). This causes damage to the cartilage, which can break down over time. The knees, hips, back and neck are most often affected by this type of arthritis. Other types of arthritis and common causes of joint pain include:  Sprains and other injuries near the joint. Sometimes minor sprains and injuries cause pain and swelling that develop hours later.  Rheumatoid arthritis. This affects hands, feet and knees. It usually affects both sides of your body at the same time. It is often associated with chronic ailments, fever, weight loss and general weakness.  Crystal arthritis. Gout and pseudo gout can cause occasional acute severe pain, redness and swelling in the foot, ankle, or knee.  Infectious arthritis. Bacteria can get into a joint through a break in overlying skin. This can cause infection of the joint. Bacteria and viruses can also spread through the blood and affect your joints.  Drug, infectious and allergy reactions. Sometimes joints can become mildly painful and slightly swollen with these types of illnesses. SYMPTOMS   Pain is the  main symptom.  Your joint or joints can also be red, swollen and warm or hot to the touch.  You may have a fever with certain types of arthritis, or even feel overall ill.  The joint with arthritis will hurt  with movement. Stiffness is present with some types of arthritis. DIAGNOSIS  Your caregiver will suspect arthritis based on your description of your symptoms and on your exam. Testing may be needed to find the type of arthritis:  Blood and sometimes urine tests.  X-ray tests and sometimes CT or MRI scans.  Removal of fluid from the joint (arthrocentesis) is done to check for bacteria, crystals or other causes. Your caregiver (or a specialist) will numb the area over the joint with a local anesthetic, and use a needle to remove joint fluid for examination. This procedure is only minimally uncomfortable.  Even with these tests, your caregiver may not be able to tell what kind of arthritis you have. Consultation with a specialist (rheumatologist) may be helpful. TREATMENT  Your caregiver will discuss with you treatment specific to your type of arthritis. If the specific type cannot be determined, then the following general recommendations may apply. Treatment of severe joint pain includes:  Rest.  Elevation.  Anti-inflammatory medication (for example, ibuprofen) may be prescribed. Avoiding activities that cause increased pain.  Only take over-the-counter or prescription medicines for pain and discomfort as recommended by your caregiver.  Cold packs over an inflamed joint may be used for 10 to 15 minutes every hour. Hot packs sometimes feel better, but do not use overnight. Do not use hot packs if you are diabetic without your caregiver's permission.  A cortisone shot into arthritic joints may help reduce pain and swelling.  Any acute arthritis that gets worse over the next 1 to 2 days needs to be looked at to be sure there is no joint infection. Long-term arthritis treatment involves modifying activities and lifestyle to reduce joint stress jarring. This can include weight loss. Also, exercise is needed to nourish the joint cartilage and remove waste. This helps keep the muscles around the  joint strong. HOME CARE INSTRUCTIONS   Do not take aspirin to relieve pain if gout is suspected. This elevates uric acid levels.  Only take over-the-counter or prescription medicines for pain, discomfort or fever as directed by your caregiver.  Rest the joint as much as possible.  If your joint is swollen, keep it elevated.  Use crutches if the painful joint is in your leg.  Drinking plenty of fluids may help for certain types of arthritis.  Follow your caregiver's dietary instructions.  Try low-impact exercise such as:  Swimming.  Water aerobics.  Biking.  Walking.  Morning stiffness is often relieved by a warm shower.  Put your joints through regular range-of-motion. SEEK MEDICAL CARE IF:   You do not feel better in 24 hours or are getting worse.  You have side effects to medications, or are not getting better with treatment. SEEK IMMEDIATE MEDICAL CARE IF:   You have a fever.  You develop severe joint pain, swelling or redness.  Many joints are involved and become painful and swollen.  There is severe back pain and/or leg weakness.  You have loss of bowel or bladder control. Document Released: 08/18/2004 Document Revised: 10/03/2011 Document Reviewed: 09/03/2008 [facility] Patient Information 2014 [place].

## 2013-11-11 NOTE — Progress Notes (Signed)
   Subjective:    Patient ID: [name], female    DOB: 1956-04-22, 58 y.o.   MRN: [id number]  HPI Comments: 58 y.o Past Medical History smoker, bursitis of right shoulder, Back pain, Depression, GERD (gastroesophageal reflux disease), Bradycardia, personal history of goiter. BP 127/72 HR 58 today           She presents for:  1) Back pain-04/2008 imaging reviewed with left facet arthropathy L4-L5 and degenerative disc lumbar spine L4-L5 and disc protrusion L5-S1. Back pain has been worse for 4-5 months. She only tried Tylenol 4-6 pills daily which helps sometimes.  Pain is 10/10 in her lower back intermittently.  Standing makes it worse and rest makes it better.  She denies radiation.  She feels like her "back comes apart at the bottom".  She denies perineal sensation changes.  She tried PT in the past which helped but does not want to try at this time maybe when school gets out for her grandkids.   2) Knee pain b/l knees R>L. Knee pain is intermittent. No prior imaging to review.  She feels like her knee pops though she cant hear it while walking.  At times she has to grab hold to keep from falling.  She denies recent falls. Pain is 7/10 today.    SH: smokes 1 cig per day      Review of Systems  Constitutional:       Weight loss trying to eat healthier lost 7 lbs   Cardiovascular: Negative for chest pain and leg swelling.  Gastrointestinal: Negative for abdominal distention.  Musculoskeletal: Positive for arthralgias, back pain and gait problem.       Objective:   Physical Exam  Nursing note and vitals reviewed. Constitutional: She is oriented to person, place, and time. She appears well-developed and well-nourished. She is cooperative. No distress.  HENT:  Head: Normocephalic and atraumatic.  Mouth/Throat: Oropharynx is clear and moist and mucous membranes are normal. Abnormal dentition. No oropharyngeal exudate.  Eyes: Conjunctivae are normal. Pupils are equal, round, and  reactive to light. Right eye exhibits no discharge. Left eye exhibits no discharge. No scleral icterus.  Cardiovascular: Regular rhythm, S1 normal, S2 normal and normal heart sounds.  Bradycardia present.   No murmur heard. No lower ext edema   Pulmonary/Chest: Effort normal and breath sounds normal. No respiratory distress. She has no wheezes.  Musculoskeletal:       Lumbar back: She exhibits tenderness.  Crepitus with ROM b/l  No ttp b/l knees  Mild to mod ttp lower back   Neurological: She is alert and oriented to person, place, and time.  Limping initially with walking   Skin: Skin is warm, dry and intact. No rash noted. She is not diaphoretic.  Psychiatric: She has a normal mood and affect. Her speech is normal and behavior is normal. Judgment and thought content normal. Cognition and memory are normal.          Assessment & Plan:  F/u in 1 month

## 2013-12-20 ENCOUNTER — Encounter: Payer: Self-pay | Admitting: Internal Medicine

## 2013-12-20 ENCOUNTER — Encounter: Payer: Medicare Other | Admitting: Internal Medicine

## 2013-12-24 DIAGNOSIS — F330 Major depressive disorder, recurrent, mild: Secondary | ICD-10-CM | POA: Diagnosis not present

## 2013-12-24 DIAGNOSIS — F331 Major depressive disorder, recurrent, moderate: Secondary | ICD-10-CM | POA: Diagnosis not present

## 2014-01-20 ENCOUNTER — Ambulatory Visit: Payer: Medicare Other | Admitting: Internal Medicine

## 2014-01-21 ENCOUNTER — Other Ambulatory Visit: Payer: Self-pay | Admitting: *Deleted

## 2014-01-21 MED ORDER — OMEPRAZOLE 20 MG PO CPDR: 20.0000 mg | DELAYED_RELEASE_CAPSULE | Freq: Two times a day (BID) | ORAL | Status: DC

## 2014-01-27 IMAGING — CR DG CHEST 2V
2 series · 2 of 2 positions shown · non-contrast
Comparison: 01/01/2005

CLINICAL DATA: The chest pain

CHEST - 2 VIEW

[w chest pa]
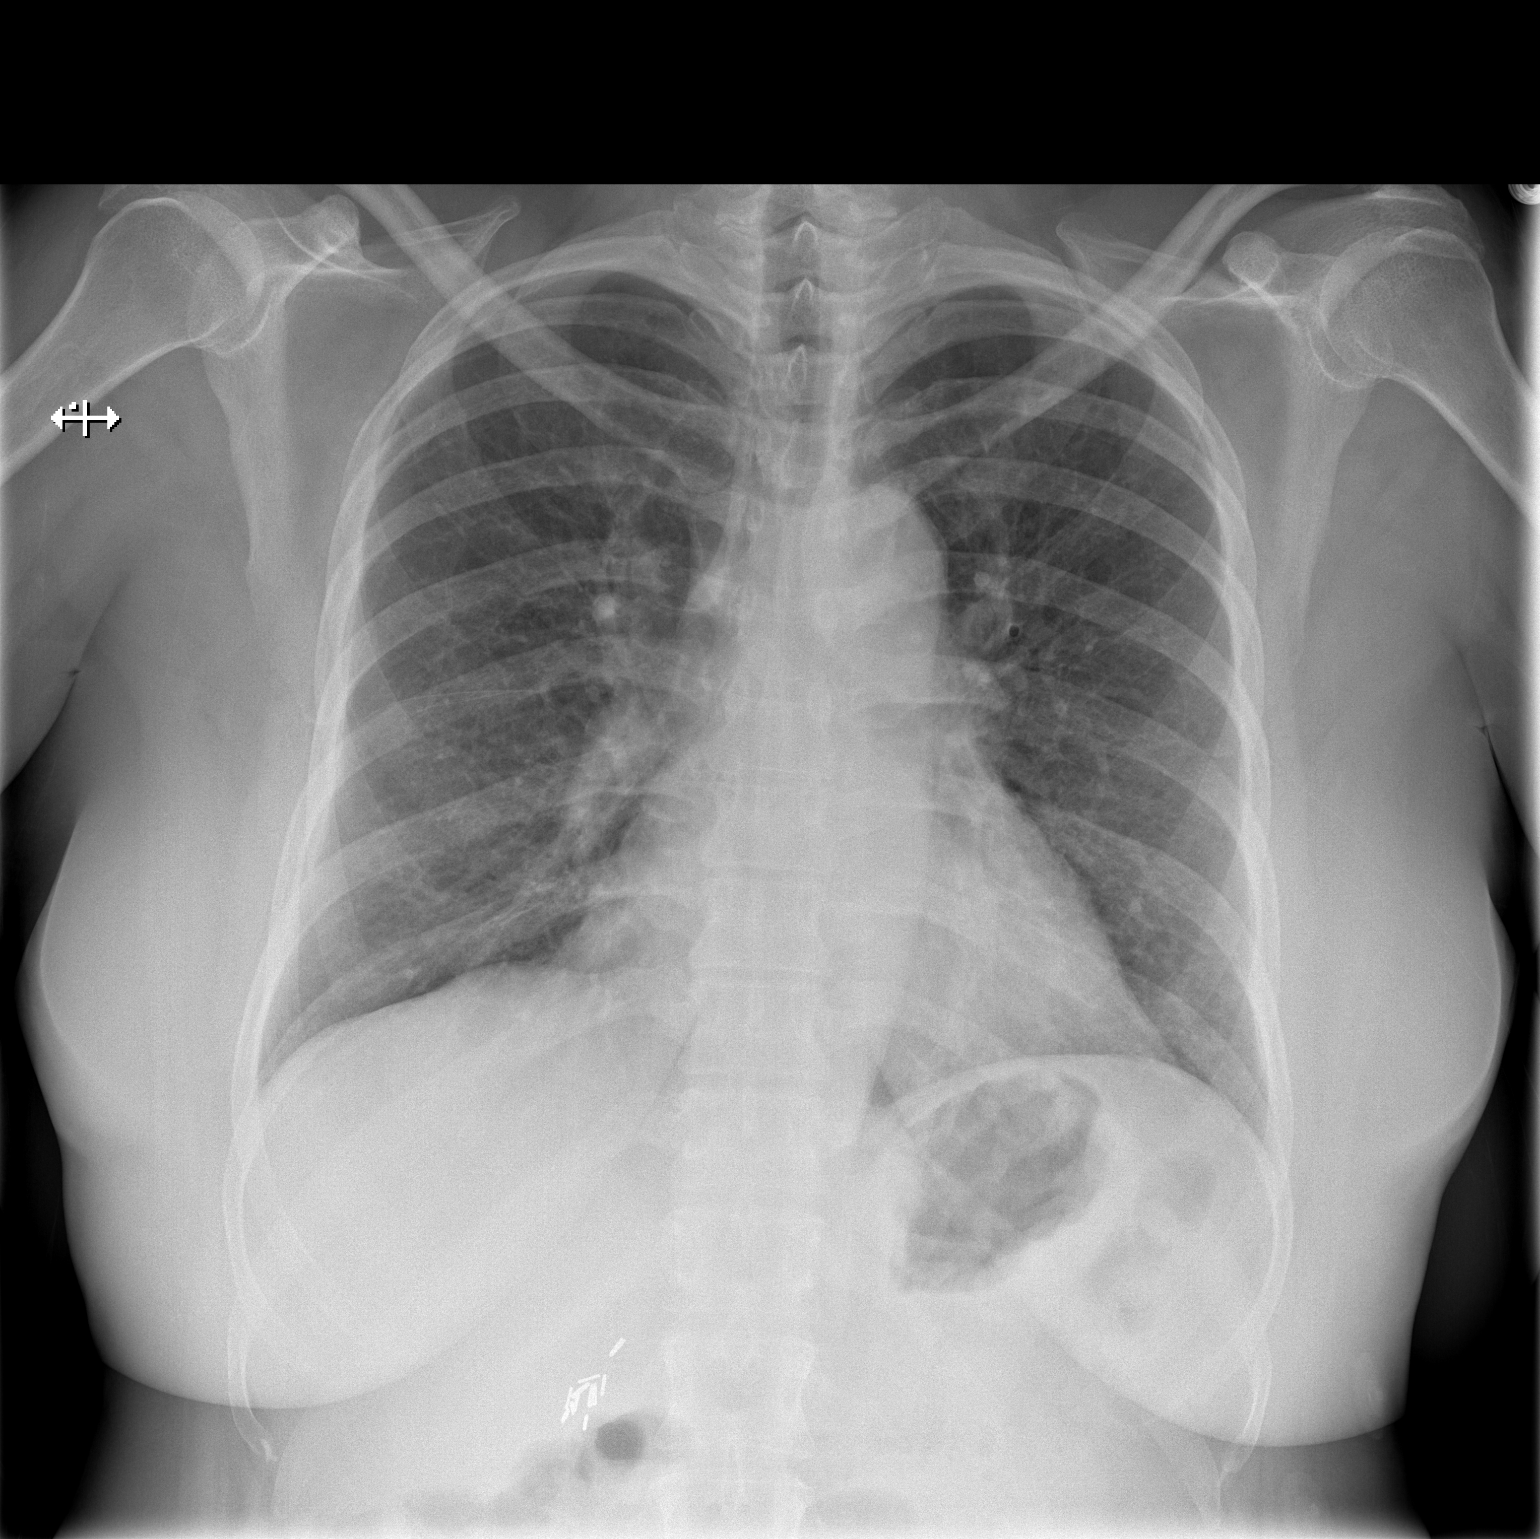

[w chest lat]
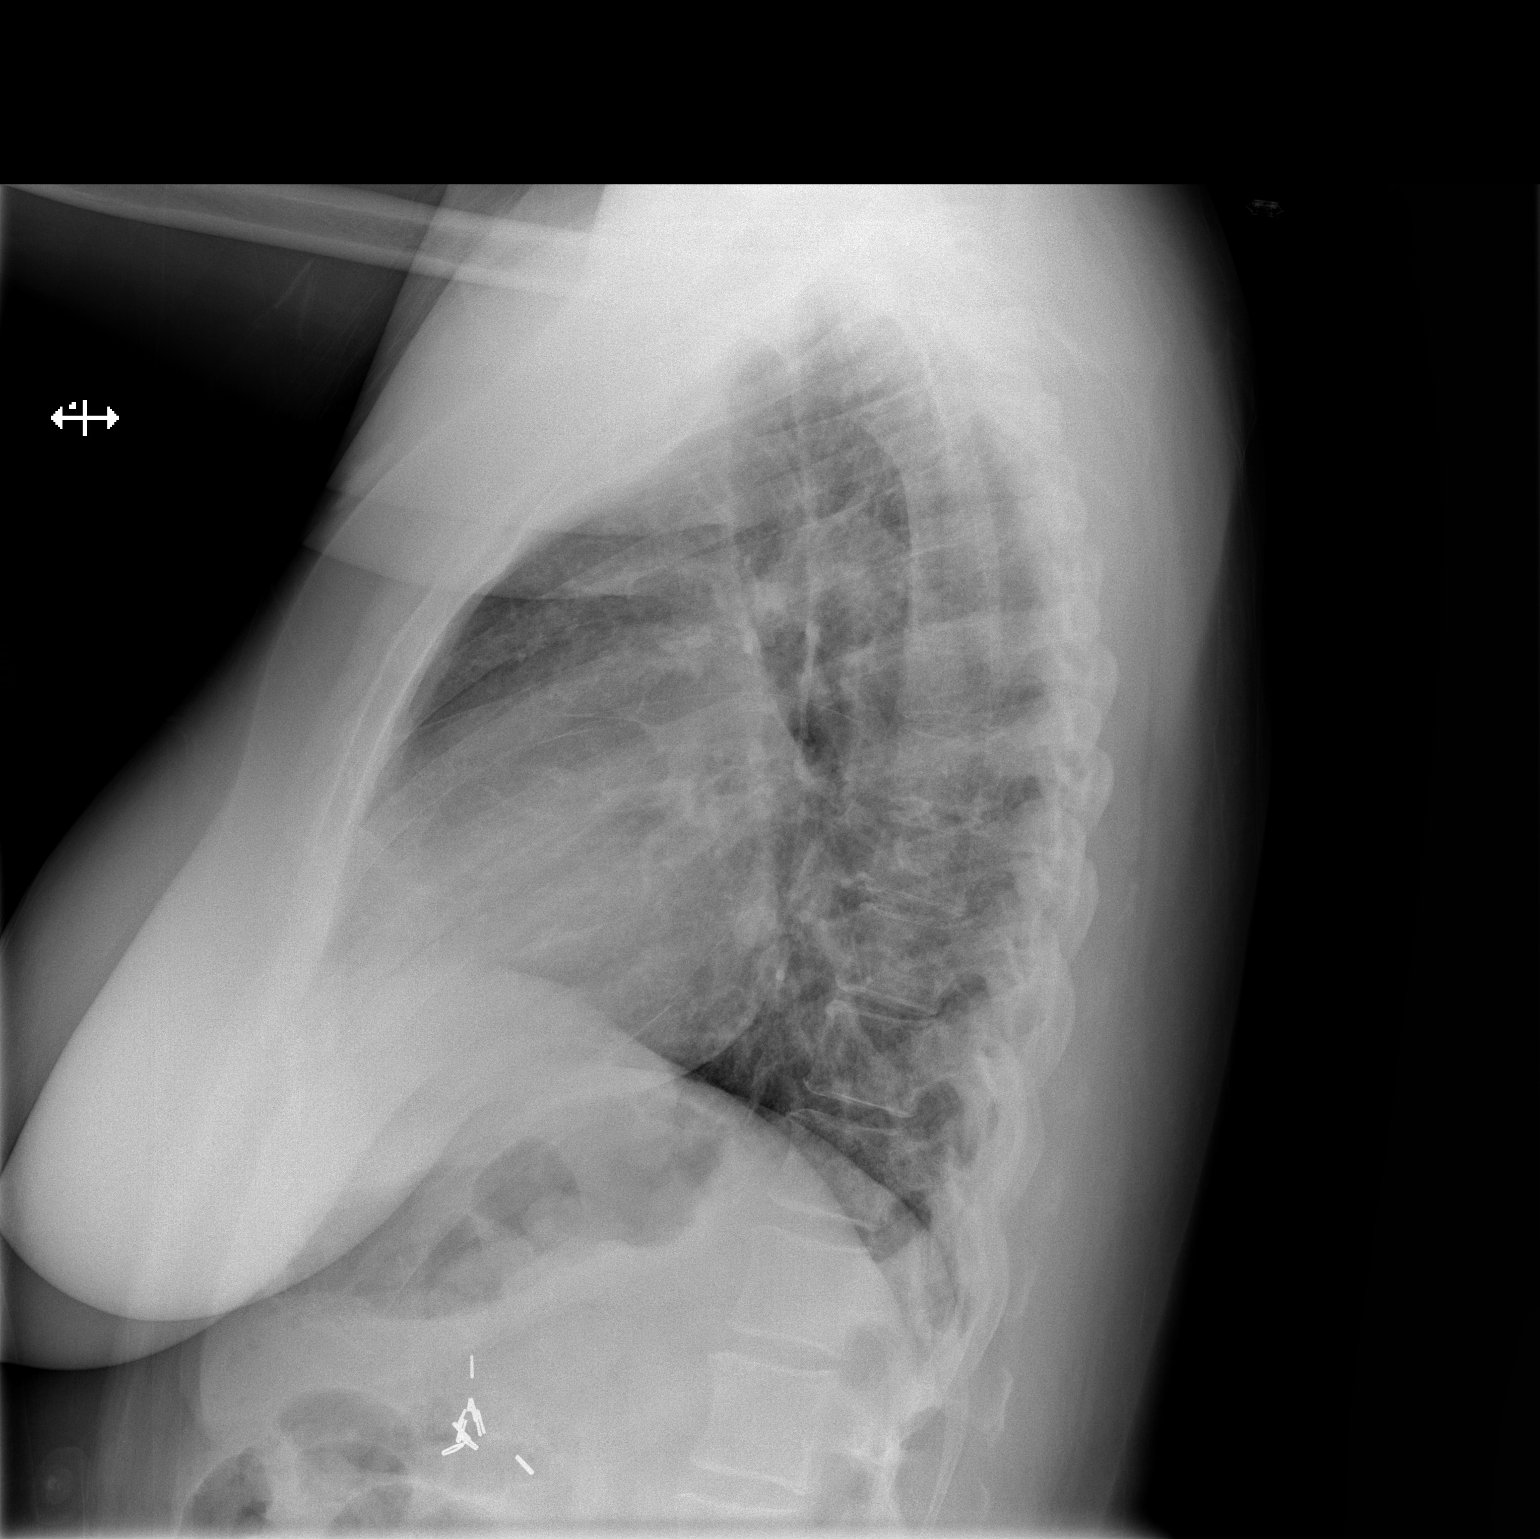

[2 of 2 positions shown; findings below may reference images not displayed]

FINDINGS: The lungs are clear without focal consolidation, edema,
effusion or pneumothorax.  Interstitial markings are diffusely
coarsened with chronic features. Cardiopericardial silhouette is
within normal limits for size.  Imaged bony structures of the
thorax are intact.
IMPRESSION: No acute cardiopulmonary findings.

## 2014-03-03 ENCOUNTER — Encounter: Payer: Self-pay | Admitting: Internal Medicine

## 2014-03-03 ENCOUNTER — Ambulatory Visit (INDEPENDENT_AMBULATORY_CARE_PROVIDER_SITE_OTHER): Payer: Medicare Other | Admitting: Internal Medicine

## 2014-03-03 VITALS — BP 128/79 | HR 52 | Temp 97.3°F | Ht 66.0 in | Wt 189.3 lb

## 2014-03-03 DIAGNOSIS — M545 Low back pain, unspecified: Secondary | ICD-10-CM

## 2014-03-03 DIAGNOSIS — F3289 Other specified depressive episodes: Secondary | ICD-10-CM | POA: Diagnosis not present

## 2014-03-03 DIAGNOSIS — L538 Other specified erythematous conditions: Secondary | ICD-10-CM

## 2014-03-03 DIAGNOSIS — L309 Dermatitis, unspecified: Secondary | ICD-10-CM

## 2014-03-03 DIAGNOSIS — F32A Depression, unspecified: Secondary | ICD-10-CM

## 2014-03-03 DIAGNOSIS — K219 Gastro-esophageal reflux disease without esophagitis: Secondary | ICD-10-CM | POA: Diagnosis not present

## 2014-03-03 DIAGNOSIS — M1710 Unilateral primary osteoarthritis, unspecified knee: Secondary | ICD-10-CM

## 2014-03-03 DIAGNOSIS — L259 Unspecified contact dermatitis, unspecified cause: Secondary | ICD-10-CM

## 2014-03-03 DIAGNOSIS — F329 Major depressive disorder, single episode, unspecified: Secondary | ICD-10-CM

## 2014-03-03 DIAGNOSIS — L304 Erythema intertrigo: Secondary | ICD-10-CM

## 2014-03-03 DIAGNOSIS — M170 Bilateral primary osteoarthritis of knee: Secondary | ICD-10-CM

## 2014-03-03 DIAGNOSIS — Z720 Tobacco use: Secondary | ICD-10-CM

## 2014-03-03 DIAGNOSIS — F17200 Nicotine dependence, unspecified, uncomplicated: Secondary | ICD-10-CM | POA: Diagnosis not present

## 2014-03-03 MED ORDER — NYSTATIN 100000 UNIT/GM EX CREA: 1.0000 "application " | TOPICAL_CREAM | Freq: Two times a day (BID) | CUTANEOUS | Status: DC

## 2014-03-03 MED ORDER — DIPHENHYDRAMINE HCL 25 MG PO CAPS: 25.0000 mg | ORAL_CAPSULE | ORAL | Status: DC | PRN

## 2014-03-03 MED ORDER — TRAMADOL HCL 50 MG PO TABS: 50.0000 mg | ORAL_TABLET | Freq: Four times a day (QID) | ORAL | Status: DC | PRN

## 2014-03-03 MED ORDER — HYDROCORTISONE 1 % EX CREA: TOPICAL_CREAM | CUTANEOUS | Status: DC

## 2014-03-03 NOTE — Progress Notes (Signed)
Patient ID: [name], female   DOB: 1956-01-12, 58 y.o.   MRN: [id number]    Subjective:   Patient ID: [name] female   DOB: 10-Mar-1956 58 y.o.   MRN: [id number]  HPI: [name] is a 58 y.o. very pleasant woman with past medical history of bilateral knee osteoarthritis, chronic low back pain, depression, chronic bradycardia, GERD, and tobacco abuse who presents for routine clinic visit.   She reports chronic osteoarthritis in both of her knees with the right knee worse than left. She uses tylenol and tramadol (2-3 times per day) which helps partially alleviate the pain which today is 9/10. At times her right knee is swollen but denies erythema or warmth. She reports numbness in both legs (right worse than left). She has trouble climbing stairs but is currently ambulating without difficulty. She also  has occasional locking of her knee without recent injury, fall, or trauma. She has not had prior imaging or corticosteroid injections.   Her low back pain is well-controlled with tramadol. She denies fever, chills, LE weakness, weight loss, or bladder/bowel incontinence.   She repots a history of eczema and reports a pruritic rash on her chest near her breasts. She has not applied anything topically to the area. She also has a rash underneath both of her breasts that is not pruritic or foul smelling. She has had recent moisture under both breasts.    Her acid reflux disease is well-controlled with prilosec 20 mg BID.   Her mood is well-controlled with prozac 40 mg daily.   She smokes 1 cigarette every few days. She denies chronic cough, dyspnea, or wheezing.   Past Medical History  Diagnosis Date  . Ecchymoses, spontaneous 08/12/2010  . Weight gain 08/12/2010  . Fatigue 08/12/2010  . Smoker 05/10/2010  . Bursitis of right shoulder 05/10/2010  . Inguinal pain 03/23/2010    right  . Shoulder pain, right 07/07/2009  . Mole (skin) 07/07/2009  . Back pain 07/07/2009  .  Depression 05/15/2006  . GERD (gastroesophageal reflux disease) 05/15/2006  . Bradycardia 05/15/2006  . Personal history of goiter 05/15/2006   Current Outpatient Prescriptions  Medication Sig Dispense Refill  . acetaminophen (TYLENOL) 500 MG tablet Take 500 mg by mouth every 6 (six) hours as needed.      Marland Kitchen FLUoxetine (PROZAC) 40 MG capsule Take 40 mg by mouth daily.      . Multiple Vitamin (MULTIVITAMIN WITH MINERALS) TABS Take 1 tablet by mouth daily.      . Olopatadine HCl (PATADAY) 0.2 % SOLN Place 1 drop into both eyes 2 (two) times daily.      Marland Kitchen omeprazole (PRILOSEC) 20 MG capsule Take 1 capsule (20 mg total) by mouth 2 (two) times daily.  180 capsule  3  . traMADol (ULTRAM) 50 MG tablet Take 1 tablet (50 mg total) by mouth every 6 (six) hours as needed.  120 tablet  0   No current facility-administered medications for this visit.   No family history on file. History   Social History  . Marital Status: Legally Separated    Spouse Name: N/A    Number of Children: N/A  . Years of Education: N/A   Social History Main Topics  . Smoking status: Current Some Day Smoker -- 0.10 packs/day    Types: Cigarettes    Last Attempt to Quit: 05/25/2006  . Smokeless tobacco: None     Comment: maybe 1-2 cigarette a week  . Alcohol Use: No  .  Drug Use: No  . Sexual Activity: None   Other Topics Concern  . None   Social History Narrative  . None   Review of Systems: Review of Systems  Constitutional: Negative for fever, chills and weight loss.  HENT: Negative for congestion and sore throat.   Eyes: Negative for blurred vision.  Respiratory: Negative for cough, shortness of breath and wheezing.   Cardiovascular: Negative for chest pain, palpitations and leg swelling.  Gastrointestinal: Negative for nausea, vomiting, abdominal pain, diarrhea and constipation.  Genitourinary: Negative for dysuria, urgency and frequency.  Musculoskeletal: Positive for back pain (chronic low back ),  joint pain (bilateral knees R>L) and myalgias (cramping). Negative for falls.  Skin: Positive for itching (anterior chest) and rash (anterior chest and under both breasts).  Neurological: Positive for sensory change (bilateral LE (right > left)). Negative for dizziness and headaches.  Psychiatric/Behavioral: Negative for depression.    Objective:  Physical Exam: Filed Vitals:   03/03/14 1410  BP: 128/79  Pulse: 52  Temp: 97.3 F (36.3 C)  TempSrc: Oral  Height: 5\' 6"  (1.676 m)  Weight: 189 lb 4.8 oz (85.866 kg)  SpO2: 97%    Physical Exam  Constitutional: She is oriented to person, place, and time. She appears well-developed and well-nourished. No distress.  HENT:  Head: Normocephalic and atraumatic.  Eyes: EOM are normal.  Neck: Normal range of motion. Neck supple.  Cardiovascular: Regular rhythm and normal heart sounds.   Bradycardic  Pulmonary/Chest: Effort normal and breath sounds normal. No respiratory distress. She has no wheezes. She has no rales.  Abdominal: Soft. Bowel sounds are normal. She exhibits no distension. There is no tenderness. There is no rebound and no guarding.  Musculoskeletal: She exhibits no edema and no tenderness.  Decreased ROM of right knee with bony crepitus No overlying warmth, erythema, or tenderness   Neurological: She is alert and oriented to person, place, and time.  Skin: Skin is warm and dry. Rash (Papular rash on anterior chest with erythema. Maceration underneath both breasts without odor or drainage) noted. She is not diaphoretic. No pallor.  Psychiatric: She has a normal mood and affect. Her behavior is normal. Judgment and thought content normal.    Assessment & Plan:   Please see problem list for problem-based assessment and plan

## 2014-03-03 NOTE — Patient Instructions (Addendum)
-  I have refilled your tramadol and will refer you to sports medicine for your right knee pain -Use benadryl every 4 hrs as needed for itching -Apply nystatin cream twice a day to under your breasts for 10 days -Apply hydrocortisone cream to your chest twice daily until rash disappears -Will see you back in 6 months, pleasure meeting you!  General Instructions:   Please try to bring all your medicines next time. This will help [place] keep you safe from mistakes.   Progress Toward Treatment Goals:  Treatment Goal 11/11/2013  Stop smoking smoking less    Self Care Goals & Plans:  Self Care Goal 03/03/2014  Manage my medications take my medicines as prescribed; bring my medications to every visit; refill my medications on time; follow the sick day instructions if I am sick  Monitor my health keep track of my weight  Eat healthy foods eat more vegetables; eat fruit for snacks and desserts; eat baked foods instead of fried foods; eat foods that are low in salt; eat smaller portions  Be physically active find an activity I enjoy  Stop smoking -  Other -  Meeting treatment goals -    No flowsheet data found.   Care Management & Community Referrals:  Referral 11/11/2013  Referrals made for care management support none needed  Referrals made to community resources none

## 2014-03-04 DIAGNOSIS — L304 Erythema intertrigo: Secondary | ICD-10-CM | POA: Insufficient documentation

## 2014-03-04 DIAGNOSIS — L309 Dermatitis, unspecified: Secondary | ICD-10-CM | POA: Insufficient documentation

## 2014-03-04 HISTORY — DX: Erythema intertrigo: L30.4

## 2014-03-04 NOTE — Assessment & Plan Note (Addendum)
Assessment: Pt with bilateral osteoarthritis (right worse then left) with no previous imaging or intraarticular steroid injections with moderately well-controlled pain on narcotic therapy who presents with occasional knee locking without recent fall, injury, or trauma.   Plan:  -Refill tramadol 50 mg Q 6 hr PRN pain -Acetaminophen 500 mg Q 6hr PRN pain -Refer to sports medicine for further management and treatment (pt would benefit from intraarticular steroid injections)

## 2014-03-04 NOTE — Progress Notes (Signed)
Case discussed with Dr. Rabbani at the time of the visit.  We reviewed the resident's history and exam and pertinent patient test results.  I agree with the assessment, diagnosis, and plan of care documented in the resident's note. 

## 2014-03-04 NOTE — Assessment & Plan Note (Addendum)
Assessment: Pt with bilateral erythema and maceration underneath both breasts most likely due to candida intertrigo in setting of increased moisture.   Plan:  -Apply nystatin cream BID x 10 days -Pt instructed to keep area dry -Monitor for resolution

## 2014-03-04 NOTE — Assessment & Plan Note (Addendum)
Assessment: Pt with history of eczema who presents with pruritic rash on anterior chest most likely due to eczema flare without superimposed infection.   Plan: -Apply hydrocortisone cream 1% to area BID until rash resolves -Benadryl 25 mg Q 4 hr PRN pruritis  -Monitor for resolution

## 2014-03-04 NOTE — Assessment & Plan Note (Signed)
Assessment: Pt with well-controlled depression compliant with SSRI therapy who presents with stable mood.   Plan:  -Continue fluoxetine 40 mg daily  -Continue to monitor mood

## 2014-03-04 NOTE — Assessment & Plan Note (Signed)
Assessment: Pt with well-controlled acid reflux disease compliant with PPI therapy who presents with no alarm symptoms.   Plan: -Continue omeprazole 20 mg BID -Monitor for alarm symptoms warranting EGD

## 2014-03-04 NOTE — Assessment & Plan Note (Addendum)
Assessment: Pt is a chronic smoker currently smoking 1 cigarettes every few days who has plans to eventually quit.   Plan: -Encourage tobacco cessation (pt aware of available resources)

## 2014-03-10 ENCOUNTER — Ambulatory Visit: Payer: Medicare Other | Admitting: Family Medicine

## 2014-03-12 ENCOUNTER — Encounter: Payer: Self-pay | Admitting: Family Medicine

## 2014-03-12 ENCOUNTER — Ambulatory Visit (INDEPENDENT_AMBULATORY_CARE_PROVIDER_SITE_OTHER): Payer: Medicare Other | Admitting: Family Medicine

## 2014-03-12 VITALS — BP 111/47 | Ht 67.0 in | Wt 189.0 lb

## 2014-03-12 DIAGNOSIS — M1710 Unilateral primary osteoarthritis, unspecified knee: Secondary | ICD-10-CM | POA: Diagnosis not present

## 2014-03-12 DIAGNOSIS — M170 Bilateral primary osteoarthritis of knee: Secondary | ICD-10-CM

## 2014-03-12 MED ORDER — METHYLPREDNISOLONE ACETATE 40 MG/ML IJ SUSP
40.0000 mg | Freq: Once | INTRAMUSCULAR | Status: AC
  Administered 2014-03-12: 40 mg via INTRA_ARTICULAR

## 2014-03-14 NOTE — Progress Notes (Signed)
Patient ID: [name], female   DOB: [birth year], 58 y.o.   MRN: [id number]  [name] - 58 y.o. female MRN [id number]  Date of birth: [birth year]    SUBJECTIVE:     Bilateral right greater than left knee pain for many months to years. Worse over the last couple of months particularly the right knee. Pain with most activities that require banding, climbing stairs, prolonged walking. Occasional night pain in the right knee but not much in the left knee. Pain is aching in nature 4-7/10. No locking. Occasionally feels like the right knee is going to give way. ROS:     Has not noted any knee erythema or swelling. No fever, sweats, chills, unusual weight change. She does have multiple arthralgias and other joints particularly her back and has been diagnosed with osteoporosis of the lumbar spine.  PERTINENT  PMH / PSH FH / / SH:  Past Medical, Surgical, Social, and Family History Reviewed & Updated in the EMR.  Pertinent findings include:  OA lumbar spine No personal history of diabetes mellitus Smoker  OBJECTIVE: BP 111/47  Ht 5\' 7"  (1.702 m)  Wt 189 lb (85.73 kg)  BMI 29.59 kg/m2  LMP 11/08/1991  Physical Exam:  Vital signs are reviewed. GENERAL: Well-developed female no acute distress KNEES: Bilaterally symmetrical without evidence of erythema or effusion. Some mild synovial thickening notably on the right knee proximal medial area. Full range of motion in flexion and extension. Crepitus right greater than left. Popliteal space is benign. He. She's ligamentously intact to varus and valgus stress bilaterally and she has normal Lachman bilaterally. Pain with McMurray but no pop on the right. Mild pain on the left with positioning for McMurray (into extreme knee flexion and axial loading) but no increased pain with rotation.  IMAGING: She's not had any imaging of her knees to  INJECTION: Patient was given informed consent, signed copy in the chart. Appropriate time out was taken. Area  prepped and draped in usual sterile fashion. One cc of methylprednisolone 40 mg/ml plus  4 cc of 1% lidocaine without epinephrine was injected into the right knee using a(n) anterior medial approach. The patient tolerated the procedure well. There were no complications. Post procedure instructions were given.  ASSESSMENT & PLAN:  See problem based charting & AVS for pt instructions.

## 2014-03-14 NOTE — Assessment & Plan Note (Signed)
Likely also arthritis of knees we have no imaging. We discussed options. She would really like to see corticosteroid injection into at least one knee today resident waiting for imaging as her knees really bothering her. I doubt the imaging is going to show [place] anything that would preclude injection therapy. I think she most likely has a combination of degenerative meniscus with some knee joint OA. We will give her injection set her up for imaging for both knees and see her back in 3-4 weeks.

## 2014-03-18 ENCOUNTER — Ambulatory Visit
Admission: RE | Admit: 2014-03-18 | Discharge: 2014-03-18 | Disposition: A | Discharge status: Home / Self Care | Payer: Medicare Other | Source: Ambulatory Visit | Attending: Family Medicine | Admitting: Family Medicine

## 2014-03-18 DIAGNOSIS — M25569 Pain in unspecified knee: Secondary | ICD-10-CM | POA: Diagnosis not present

## 2014-03-18 DIAGNOSIS — M170 Bilateral primary osteoarthritis of knee: Secondary | ICD-10-CM

## 2014-03-20 ENCOUNTER — Encounter: Payer: Self-pay | Admitting: Family Medicine

## 2014-04-04 ENCOUNTER — Ambulatory Visit (INDEPENDENT_AMBULATORY_CARE_PROVIDER_SITE_OTHER): Payer: Medicare Other | Admitting: Family Medicine

## 2014-04-04 ENCOUNTER — Encounter: Payer: Self-pay | Admitting: Family Medicine

## 2014-04-04 VITALS — BP 119/70 | HR 59 | Ht 67.0 in | Wt 189.0 lb

## 2014-04-04 DIAGNOSIS — M25561 Pain in right knee: Secondary | ICD-10-CM

## 2014-04-04 DIAGNOSIS — M25562 Pain in left knee: Secondary | ICD-10-CM

## 2014-04-04 DIAGNOSIS — IMO0002 Reserved for concepts with insufficient information to code with codable children: Secondary | ICD-10-CM | POA: Diagnosis not present

## 2014-04-04 DIAGNOSIS — M5416 Radiculopathy, lumbar region: Secondary | ICD-10-CM

## 2014-04-04 DIAGNOSIS — F17200 Nicotine dependence, unspecified, uncomplicated: Secondary | ICD-10-CM

## 2014-04-04 DIAGNOSIS — M25569 Pain in unspecified knee: Secondary | ICD-10-CM

## 2014-04-04 DIAGNOSIS — M1710 Unilateral primary osteoarthritis, unspecified knee: Secondary | ICD-10-CM | POA: Diagnosis not present

## 2014-04-04 DIAGNOSIS — Z720 Tobacco use: Secondary | ICD-10-CM

## 2014-04-04 MED ORDER — GABAPENTIN 300 MG PO CAPS: 300.0000 mg | ORAL_CAPSULE | Freq: Every day | ORAL | Status: DC

## 2014-04-04 NOTE — Progress Notes (Signed)
[name] - 58 y.o. female MRN [id number]  Date of birth: 1956/02/08  SUBJECTIVE:  Including CC & ROS.  The patient is following up for evaluation of: Bilateral Knee Pain: Reports overall her pain is mildly improved but still having right greater than left knee and radicular leg pain. She is a hard time describing her symptoms but does report occasional pain radiating from her buttock down to the lateral aspect of her leg. This is worse with walking and ambulation. She reports having weakness at times and was having some mechanical symptoms of these have resolved following her knee injection. She has been on tramadol has been physical therapy before for her lumbar spine. She denies any fevers, chills, changes in bowel or bladder. No significant nighttime awakenings.   She is a most every day smoker  HISTORY: Past Medical, Surgical, Social, and Family History Reviewed & Updated per EMR. Pertinent Historical Findings include: Prior lumbar spondylosis with prior herniations at L4-L5 L5-S1. Current smoker.  History of depression, back pain, just esophageal reflux, History of abdominal hysterectomy  DATA REVIEWED: Bilateral knee x-rays obtained a 25 2015 recoil no significant degenerative changes. MRI of lumbar spine from 2013 revealed significant health for L5 and L5-S1 spondylosis with degenerative disc changes  OBJECTIVE FINDINGS:  VS:  HT:5\' 7"  (170.2 cm)   WT:189 lb (85.73 kg)  BMI:29.7          BP:119/70 mmHg  HR:59bpm  TEMP: ( )  RESP:   PHYSICAL EXAM:            GENERAL:  Adult after female. In no discomfort; no respiratory distress                PSYCH:  alert and appropriate, good insight  Lower extremity Exam:   APPEAR/PALP:  Normal-appearing legs. No significant degenerative changes around the knee. Mild tenderness palpation generalized from the knee.                    ROM:  Full flexion and extension of bilateral knees. Good internal/external rotation of the hips  STRENGTH:  Extensor mechanism of the knee 5/5. Poorly defined VMOs bilaterally                  NV:  Sensation slightly diminished to light touch in the L5 distribution of the right leg. Right Achilles reflex diminished 1/4 otherwise her patellar and left Achilles 2+/4.              Tests:  Able to heel toe walk without difficulty. Negative straight leg raise.. knee is stable to varus valgus stress. Negative Thessaly. Negative McMurray's.  ASSESSMENT: 1. Lumbar back pain with radiculopathy affecting right lower extremity   2. Bilateral knee pain   3. Tobacco abuse    Her knee pain likely reflects a degenerative meniscal tear that is responded well to corticosteroid therapy but given the persistent bilateral (R>L) symptoms this is most likely coming from lumbar spondylosis with degenerative disc. She has diminished right Achilles reflex correlates with this finding as well. Given no other significant motor weakness or strength issues we will pursue conservative options.    PLAN: See problem based charting & AVS for additional documentation. - Gabapentin 300 mg each bedtime. - Home exercise program for VMO quad strengthening and back program. - Discussed option refer to physical therapy but she would like to try home exercises for. - Discussed importance of smoking cessation overall well being as well as direct relationship  with chronic pain syndromes. The reports being likely be able to totally quit > Return in about 4 weeks (around 05/02/2014).   > Consider referral to physical therapy versus further advanced imaging for consideration of lumbar ESI if worsening or persistent symptoms  > Consider titration of Neurontin to 3 times a day

## 2014-04-07 NOTE — Progress Notes (Signed)
Patient ID: [name], female   DOB: 1955/11/12, 58 y.o.   MRN: [id number] [facility]: Attending Note: I have reviewed the chart, discussed wit the Sports Medicine Fellow. I agree with assessment and treatment plan as detailed in the [facility] note. Initially seen for B knee pain and given CSI which was only mildly beneficial. Xrays not impressive for DJD. Agree with HEP for strengthening, gabapentin in case some of this is coming from her back. Additionally she is considering smoking cessation which would be great.

## 2014-04-21 DIAGNOSIS — F331 Major depressive disorder, recurrent, moderate: Secondary | ICD-10-CM | POA: Diagnosis not present

## 2014-04-25 ENCOUNTER — Ambulatory Visit: Payer: Medicare Other | Admitting: Family Medicine

## 2014-05-26 DIAGNOSIS — F331 Major depressive disorder, recurrent, moderate: Secondary | ICD-10-CM | POA: Diagnosis not present

## 2014-06-24 DIAGNOSIS — F331 Major depressive disorder, recurrent, moderate: Secondary | ICD-10-CM | POA: Diagnosis not present

## 2014-09-10 ENCOUNTER — Ambulatory Visit: Payer: Medicare Other

## 2014-09-10 DIAGNOSIS — Z23 Encounter for immunization: Secondary | ICD-10-CM

## 2014-09-16 DIAGNOSIS — F331 Major depressive disorder, recurrent, moderate: Secondary | ICD-10-CM | POA: Diagnosis not present

## 2014-10-02 DIAGNOSIS — F331 Major depressive disorder, recurrent, moderate: Secondary | ICD-10-CM | POA: Diagnosis not present

## 2014-10-10 ENCOUNTER — Encounter: Payer: Self-pay | Admitting: Internal Medicine

## 2014-10-10 ENCOUNTER — Ambulatory Visit (INDEPENDENT_AMBULATORY_CARE_PROVIDER_SITE_OTHER): Payer: Medicare Other | Admitting: Internal Medicine

## 2014-10-10 VITALS — BP 131/72 | HR 53 | Temp 97.9°F | Ht 67.0 in

## 2014-10-10 DIAGNOSIS — M25561 Pain in right knee: Secondary | ICD-10-CM

## 2014-10-10 DIAGNOSIS — F329 Major depressive disorder, single episode, unspecified: Secondary | ICD-10-CM

## 2014-10-10 DIAGNOSIS — F32A Depression, unspecified: Secondary | ICD-10-CM

## 2014-10-10 DIAGNOSIS — Z1231 Encounter for screening mammogram for malignant neoplasm of breast: Secondary | ICD-10-CM

## 2014-10-10 DIAGNOSIS — M25562 Pain in left knee: Secondary | ICD-10-CM

## 2014-10-10 DIAGNOSIS — Z0000 Encounter for general adult medical examination without abnormal findings: Secondary | ICD-10-CM

## 2014-10-10 DIAGNOSIS — M5416 Radiculopathy, lumbar region: Secondary | ICD-10-CM

## 2014-10-10 DIAGNOSIS — K219 Gastro-esophageal reflux disease without esophagitis: Secondary | ICD-10-CM

## 2014-10-10 MED ORDER — OMEPRAZOLE 20 MG PO CPDR: 20.0000 mg | DELAYED_RELEASE_CAPSULE | Freq: Two times a day (BID) | ORAL | Status: DC

## 2014-10-10 MED ORDER — GABAPENTIN 300 MG PO CAPS: 300.0000 mg | ORAL_CAPSULE | Freq: Three times a day (TID) | ORAL | Status: DC

## 2014-10-10 MED ORDER — TRAMADOL HCL 50 MG PO TABS: 50.0000 mg | ORAL_TABLET | Freq: Four times a day (QID) | ORAL | Status: DC | PRN

## 2014-10-10 NOTE — Patient Instructions (Addendum)
-  I refilled your tramadol and prilosec -Start taking gabapentin three times daily  -Will schedule you for mammogram and refer you back to sports medicine -Nice seeing you again!    General Instructions:   Please bring your medicines with you each time you come to clinic.  Medicines may include prescription medications, over-the-counter medications, herbal remedies, eye drops, vitamins, or other pills.   Progress Toward Treatment Goals:  Treatment Goal 11/11/2013  Stop smoking smoking less    Self Care Goals & Plans:  Self Care Goal 03/03/2014  Manage my medications take my medicines as prescribed; bring my medications to every visit; refill my medications on time; follow the sick day instructions if I am sick  Monitor my health keep track of my weight  Eat healthy foods eat more vegetables; eat fruit for snacks and desserts; eat baked foods instead of fried foods; eat foods that are low in salt; eat smaller portions  Be physically active find an activity I enjoy  Stop smoking -  Other -  Meeting treatment goals -    No flowsheet data found.   Care Management & Community Referrals:  Referral 11/11/2013  Referrals made for care management support none needed  Referrals made to community resources none

## 2014-10-12 MED ORDER — ACETAMINOPHEN 500 MG PO TABS: 500.0000 mg | ORAL_TABLET | Freq: Four times a day (QID) | ORAL | Status: DC | PRN

## 2014-10-12 NOTE — Progress Notes (Signed)
Patient ID: [name], female   DOB: [birth year], 59 y.o.   MRN: [id number]    Subjective:   Patient ID: [name] female   DOB: [birth year] 59 y.o.   MRN: [id number]  HPI: [name] is a 59 y.o. very pleasant woman with past medical history of bilateral knee pain, chronic low back pain, depression, chronic bradycardia, GERD, and tobacco abuse who presents for routine clinic visit.   She reports chronic bilateral knee pain (right worse than left) and saw sports medicine August and September of 2015 where she had right knee corticosteroid injection with mild improvement. She had xrays of both knees which did not reveal degenerative changes. She was supposed to return in October but did not. She denies recent fall, injury, or trauma. She has occasional right knee swelling but denies erythema or warmth. She is able to ambulate without difficulty however reports her knee will sometimes give out. She uses tylenol twice a day and ran out of tramadol a long time ago which did improve the pain. She has not had physical therapy but was given strengthening exercises to perform at home.   She also has chronic low back with numbness and tingling in both legs and is on gabapentin BID which helps. She denies fever, LE weakness, weight loss, or bladder/bowel incontinence. MRI of lumbar spine in 2013 revealed  broad-based bulging degenerated annulus and advanced facet disease at L4-5 in addition to moderate sized broad-based left paracentral disc protrusion at L5-S1 with mass effect on the left S1 nerve root.   Her acid reflux disease is well-controlled with prilosec 20 mg BID. She denies dysphagia, odynophagia, hematemesis, or melena.   Her mood is well-controlled with prozac 40 mg daily.   She is due for screening mammography. Her last one in 2014 was normal.    Past Medical History  Diagnosis Date  . Ecchymoses, spontaneous 08/12/2010  . Weight gain 08/12/2010  . Fatigue 08/12/2010  .  Smoker 05/10/2010  . Bursitis of right shoulder 05/10/2010  . Inguinal pain 03/23/2010    right  . Shoulder pain, right 07/07/2009  . Mole (skin) 07/07/2009  . Back pain 07/07/2009  . Depression 05/15/2006  . GERD (gastroesophageal reflux disease) 05/15/2006  . Bradycardia 05/15/2006  . Personal history of goiter 05/15/2006   Current Outpatient Prescriptions  Medication Sig Dispense Refill  . acetaminophen (TYLENOL) 500 MG tablet Take 500 mg by mouth every 6 (six) hours as needed.    . diphenhydrAMINE (BENADRYL) 25 mg capsule Take 1 capsule (25 mg total) by mouth every 4 (four) hours as needed. 24 capsule 2  . FLUoxetine (PROZAC) 40 MG capsule Take 40 mg by mouth daily.    Marland Kitchen gabapentin (NEURONTIN) 300 MG capsule Take 1 capsule (300 mg total) by mouth 3 (three) times daily. 90 capsule 3  . hydrocortisone cream 1 % Apply to affected area 2 times daily 30 g 1  . Multiple Vitamin (MULTIVITAMIN WITH MINERALS) TABS Take 1 tablet by mouth daily.    . Olopatadine HCl (PATADAY) 0.2 % SOLN Place 1 drop into both eyes 2 (two) times daily.    Marland Kitchen omeprazole (PRILOSEC) 20 MG capsule Take 1 capsule (20 mg total) by mouth 2 (two) times daily. 180 capsule 3  . traMADol (ULTRAM) 50 MG tablet Take 1 tablet (50 mg total) by mouth every 6 (six) hours as needed. 90 tablet 0   No current facility-administered medications for this visit.   No family history on file.  History   Social History  . Marital Status: Legally Separated    Spouse Name: N/A  . Number of Children: N/A  . Years of Education: N/A   Social History Main Topics  . Smoking status: Current Some Day Smoker -- 0.10 packs/day    Types: Cigarettes    Last Attempt to Quit: 05/25/2006  . Smokeless tobacco: Not on file     Comment: maybe 1-2 cigarette a week  . Alcohol Use: No  . Drug Use: No  . Sexual Activity: Not on file   Other Topics Concern  . None   Social History Narrative   Review of Systems: Review of Systems    Constitutional: Negative for fever and chills.  HENT: Negative for congestion.   Respiratory: Negative for cough and shortness of breath.   Cardiovascular: Negative for chest pain and leg swelling.  Gastrointestinal: Negative for heartburn (controlled on PPI), nausea, vomiting, abdominal pain, diarrhea, constipation, blood in stool and melena.  Genitourinary: Negative for dysuria, urgency, frequency and hematuria.  Musculoskeletal: Positive for back pain (chronic low back) and joint pain (chronic b/l knee pain (R>L)). Negative for falls.  Neurological: Positive for headaches. Negative for dizziness.  Psychiatric/Behavioral: Negative for depression (controlled on SSRI).    Objective:  Physical Exam: Filed Vitals:   10/10/14 1547  BP: 131/72  Pulse: 53  Temp: 97.9 F (36.6 C)  TempSrc: Oral  Height: 5\' 7"  (1.702 m)  SpO2: 100%    Physical Exam  Constitutional: She is oriented to person, place, and time. She appears well-developed and well-nourished. No distress.  HENT:  Head: Normocephalic and atraumatic.  Right Ear: External ear normal.  Left Ear: External ear normal.  Nose: Nose normal.  Mouth/Throat: Oropharynx is clear and moist. No oropharyngeal exudate.  Eyes: Conjunctivae and EOM are normal. Pupils are equal, round, and reactive to light. Right eye exhibits no discharge. Left eye exhibits no discharge. No scleral icterus.  Neck: Normal range of motion. Neck supple.  Cardiovascular: Normal rate and regular rhythm.   Pulmonary/Chest: Effort normal and breath sounds normal. No respiratory distress. She has no wheezes. She has no rales.  Abdominal: Soft. Bowel sounds are normal. She exhibits no distension. There is no tenderness. There is no rebound and no guarding.  Musculoskeletal: Normal range of motion. She exhibits no edema or tenderness.  Bilateral knees with no tenderness, swelling, erythema, or warmth.   Neurological: She is alert and oriented to person, place, and  time.  Normal 5/5 muscle strength throughout. Normal sensation to light touch of extremities. Negative straight leg test bilaterally.   Skin: Skin is warm and dry. No rash noted. She is not diaphoretic. No erythema. No pallor.  Psychiatric: She has a normal mood and affect. Her behavior is normal. Judgment and thought content normal.    Assessment & Plan:   Please see problem list for problem-based assessment and plan

## 2014-10-12 NOTE — Assessment & Plan Note (Signed)
Assessment: Pt with well-controlled depression compliant with SSRI therapy who presents with stable mood.   Plan:  -Continue fluoxetine 40 mg daily  -Pt follows with [facility]

## 2014-10-12 NOTE — Assessment & Plan Note (Addendum)
Assessment: Pt with chronic low back and right sided radiculopathy with last MRI in 2013 revealing advanced facet disease at L4-5 and disc protrusion at L5-S1 with mass effect on the left S1 nerve root who presents with moderately well-controlled pain with no alarm symptoms.    Plan: -Increase gabapentin from 300 mg BID to TID for neuropathic pain -Refill tramadol 50 mg Q 6 hr PRN pain  -Continue tylenol 500 mg Q 6 hr PRN for anti-inflammation -Refer to sports medicine for further management including ESI, PT, and possible repeat MRI imaging and neurosurgery referral

## 2014-10-12 NOTE — Assessment & Plan Note (Addendum)
Assessment: Pt with chronic anterior bilateral knee pain with no evidence of degenerative changes on xray imaging s/p right knee intraarticular corticosteroid injection 02/2015 who presents with moderately controlled pain with occasional knee locking without recent fall, injury, trauma or signs of inflammation and infection most likely due to chronic patellofemoral pain syndrome.  Plan:  -Refill tramadol 50 mg Q 6 hr PRN pain -Continue acetaminophen 500 mg Q 6hr PRN anti-inflammation -Refer to sports medicine for further management and treatment including physical thearpy

## 2014-10-12 NOTE — Assessment & Plan Note (Signed)
-  Pt scheduled for screening mammography -Pt declined routine labs including CMP, CBC w/diff, HIV Ab, and TSH at this time, will obtain at next visit.

## 2014-10-12 NOTE — Assessment & Plan Note (Signed)
Assessment: Pt with well-controlled acid reflux disease compliant with PPI therapy who presents with no alarm symptoms.   Plan: -Refill omeprazole 20 mg BID -Monitor for alarm symptoms warranting EGD

## 2014-10-13 NOTE — Progress Notes (Signed)
Internal Medicine Clinic Attending  Case discussed with Dr. Rabbani soon after the resident saw the patient.  We reviewed the resident's history and exam and pertinent patient test results.  I agree with the assessment, diagnosis, and plan of care documented in the resident's note.  

## 2014-10-20 ENCOUNTER — Encounter: Payer: Self-pay | Admitting: Family Medicine

## 2014-10-20 ENCOUNTER — Ambulatory Visit (INDEPENDENT_AMBULATORY_CARE_PROVIDER_SITE_OTHER): Payer: Medicare Other | Admitting: Family Medicine

## 2014-10-20 VITALS — BP 112/65 | Ht 66.0 in | Wt 180.0 lb

## 2014-10-20 DIAGNOSIS — M25561 Pain in right knee: Secondary | ICD-10-CM

## 2014-10-20 DIAGNOSIS — M25562 Pain in left knee: Secondary | ICD-10-CM | POA: Diagnosis not present

## 2014-10-20 DIAGNOSIS — M5416 Radiculopathy, lumbar region: Secondary | ICD-10-CM

## 2014-10-20 DIAGNOSIS — M5417 Radiculopathy, lumbosacral region: Secondary | ICD-10-CM | POA: Diagnosis not present

## 2014-10-20 MED ORDER — METHYLPREDNISOLONE ACETATE 40 MG/ML IJ SUSP
40.0000 mg | Freq: Once | INTRAMUSCULAR | Status: AC
  Administered 2014-10-20: 40 mg via INTRA_ARTICULAR

## 2014-10-20 NOTE — Progress Notes (Signed)
   HPI:  Patient reports bilateral knee pain for the last 6 months. Right knee is worse on left. She's tried gabapentin and tramadol. Her PCP prescribes this for her. The pain is located all over her knee. Also has burning in her right thigh along with pin sticking feeling. She does have some low back pain. Denies problems with stooling or urinating. Denies persistent fever. She does feel like her knees give out. She had a knee injection in her right knee in the past and reports this helped a lot. She's not been doing any exercises.  ROS: See HPI  [place]: Chronic lumbar back pain, GERD, depression, tobacco abuse, bilateral knee pain  PHYSICAL EXAM: BP 112/65 mmHg  Ht 5\' 6"  (1.676 m)  Wt 180 lb (81.647 kg)  BMI 29.07 kg/m2  LMP 11/08/1991 Gen: NAD, pleasant, cooperative MSK: Bilateral knees without effusion, erythema, or warmth. No tenderness over either joint line bilaterally. Full movement of patella. Full strength of flexion and extension in bilateral knees. Sensation intact to all of bilateral lower extremities. Some grinding is palpable with right knee extension.  ASSESSMENT/PLAN:  After informed written consent was obtained, patient was seated on exam table. R knee was prepped with betadine. Utilizing anteromedial approach, patient's left knee was injected intraarticularly with mixture of 1cc of depomedrol 40mg /mL and 4cc of 1% lidocaine without epinephrine. Patient tolerated the procedure well without immediate complications.   Bilateral knee pain Standing films did not have much evidence of joint space narrowing. Patient received a right knee corticosteroid injection today. Hopefully this will give her relief. Continue gabapentin and tramadol, prescribed by PCP. Follow-up as needed.    FOLLOW UP: F/u as needed if symptoms worsen or do not improve.   SIGNED: Delorse Limber. Ardelia Mems, MD Family Medicine Resident PGY-3 [place]

## 2014-10-20 NOTE — Assessment & Plan Note (Signed)
Standing films did not have much evidence of joint space narrowing. Patient received a right knee corticosteroid injection today. Hopefully this will give her relief. Continue gabapentin and tramadol, prescribed by PCP. Follow-up as needed.

## 2014-10-21 ENCOUNTER — Ambulatory Visit (HOSPITAL_COMMUNITY): Payer: Medicare Other | Attending: Internal Medicine

## 2014-10-21 NOTE — Progress Notes (Signed)
Patient ID: [name], female   DOB: 1956/06/25, 59 y.o.   MRN: [id number] Friend Attending Note: I have seen and examined this patient. I have discussed this patient with the resident and reviewed the assessment and plan as documented above. I agree with the resident's findings and plan.  She had really good relief from corticosteroid injection several months ago. I was present and assisted today in repeat right knee injection. Given her x-rays, I wonder if most of her arthritic changes in the patellofemoral joint. I think at some point with bands of doing an MRI of her knee. Certainly if her injection therapy stops working for her or she has to have injections more frequently, I would consider doing MRI with preparation for total joint replacement. I discussed this with her at length today.

## 2014-12-18 DIAGNOSIS — F331 Major depressive disorder, recurrent, moderate: Secondary | ICD-10-CM | POA: Diagnosis not present

## 2015-04-06 DIAGNOSIS — H25012 Cortical age-related cataract, left eye: Secondary | ICD-10-CM | POA: Diagnosis not present

## 2015-04-06 DIAGNOSIS — H2511 Age-related nuclear cataract, right eye: Secondary | ICD-10-CM | POA: Diagnosis not present

## 2015-04-06 DIAGNOSIS — H524 Presbyopia: Secondary | ICD-10-CM | POA: Diagnosis not present

## 2015-04-06 DIAGNOSIS — H1045 Other chronic allergic conjunctivitis: Secondary | ICD-10-CM | POA: Diagnosis not present

## 2015-04-06 DIAGNOSIS — F331 Major depressive disorder, recurrent, moderate: Secondary | ICD-10-CM | POA: Diagnosis not present

## 2015-04-06 DIAGNOSIS — H04123 Dry eye syndrome of bilateral lacrimal glands: Secondary | ICD-10-CM | POA: Diagnosis not present

## 2015-04-06 DIAGNOSIS — H2512 Age-related nuclear cataract, left eye: Secondary | ICD-10-CM | POA: Diagnosis not present

## 2015-04-06 DIAGNOSIS — I10 Essential (primary) hypertension: Secondary | ICD-10-CM | POA: Diagnosis not present

## 2015-04-06 DIAGNOSIS — H52223 Regular astigmatism, bilateral: Secondary | ICD-10-CM | POA: Diagnosis not present

## 2015-04-06 DIAGNOSIS — H35033 Hypertensive retinopathy, bilateral: Secondary | ICD-10-CM | POA: Diagnosis not present

## 2015-04-06 DIAGNOSIS — H3589 Other specified retinal disorders: Secondary | ICD-10-CM | POA: Diagnosis not present

## 2015-04-06 DIAGNOSIS — H5203 Hypermetropia, bilateral: Secondary | ICD-10-CM | POA: Diagnosis not present

## 2015-04-06 DIAGNOSIS — H40013 Open angle with borderline findings, low risk, bilateral: Secondary | ICD-10-CM | POA: Diagnosis not present

## 2015-05-29 DIAGNOSIS — F331 Major depressive disorder, recurrent, moderate: Secondary | ICD-10-CM | POA: Diagnosis not present

## 2015-06-03 ENCOUNTER — Ambulatory Visit: Payer: Medicare Other

## 2015-06-03 DIAGNOSIS — Z23 Encounter for immunization: Secondary | ICD-10-CM

## 2015-06-09 ENCOUNTER — Encounter: Payer: Self-pay | Admitting: Student

## 2015-07-13 ENCOUNTER — Ambulatory Visit (INDEPENDENT_AMBULATORY_CARE_PROVIDER_SITE_OTHER): Payer: Medicare Other | Admitting: Internal Medicine

## 2015-07-13 ENCOUNTER — Encounter: Payer: Self-pay | Admitting: Internal Medicine

## 2015-07-13 VITALS — BP 129/73 | HR 54 | Temp 98.1°F | Ht 67.0 in | Wt 188.3 lb

## 2015-07-13 DIAGNOSIS — F329 Major depressive disorder, single episode, unspecified: Secondary | ICD-10-CM | POA: Diagnosis not present

## 2015-07-13 DIAGNOSIS — Z0000 Encounter for general adult medical examination without abnormal findings: Secondary | ICD-10-CM

## 2015-07-13 DIAGNOSIS — F32A Depression, unspecified: Secondary | ICD-10-CM

## 2015-07-13 DIAGNOSIS — E785 Hyperlipidemia, unspecified: Secondary | ICD-10-CM

## 2015-07-14 DIAGNOSIS — E785 Hyperlipidemia, unspecified: Secondary | ICD-10-CM | POA: Insufficient documentation

## 2015-07-14 LAB — LIPID PANEL
CHOL/HDL RATIO: 3.4 ratio (ref 0.0–4.4)
Cholesterol, Total: 241 mg/dL — ABNORMAL HIGH (ref 100–199)
HDL: 71 mg/dL (ref >39)
LDL CALC: 137 mg/dL — AB (ref 0–99)
Triglycerides: 166 mg/dL — ABNORMAL HIGH (ref 0–149)
VLDL Cholesterol Cal: 33 mg/dL (ref 5–40)

## 2015-07-14 LAB — BMP8+ANION GAP
Anion Gap: 13 mmol/L (ref 10.0–18.0)
BUN/Creatinine Ratio: 14 (ref 11–26)
BUN: 11 mg/dL (ref 8–27)
CALCIUM: 9.1 mg/dL (ref 8.7–10.3)
CO2: 29 mmol/L (ref 18–29)
CREATININE: 0.81 mg/dL (ref 0.57–1.00)
Chloride: 99 mmol/L (ref 96–106)
GFR, EST AFRICAN AMERICAN: 91 mL/min/{1.73_m2} (ref >59)
GFR, EST NON AFRICAN AMERICAN: 79 mL/min/{1.73_m2} (ref >59)
Glucose: 83 mg/dL (ref 65–99)
POTASSIUM: 4.1 mmol/L (ref 3.5–5.2)
Sodium: 141 mmol/L (ref 134–144)

## 2015-07-14 LAB — CBC
HEMOGLOBIN: 13.6 g/dL (ref 11.1–15.9)
Hematocrit: 41.3 % (ref 34.0–46.6)
MCH: 30.2 pg (ref 26.6–33.0)
MCHC: 32.9 g/dL (ref 31.5–35.7)
MCV: 92 fL (ref 79–97)
NRBC: 0 % (ref 0–0)
Platelets: 267 10*3/uL (ref 150–379)
RBC: 4.5 x10E6/uL (ref 3.77–5.28)
RDW: 14.2 % (ref 12.3–15.4)
WBC: 3.8 10*3/uL (ref 3.4–10.8)

## 2015-07-14 MED ORDER — PRAVASTATIN SODIUM 40 MG PO TABS: 40.0000 mg | ORAL_TABLET | Freq: Every evening | ORAL | Status: DC

## 2015-07-14 NOTE — Progress Notes (Signed)
   Subjective:   Patient ID: [name] female   DOB: 1955-10-20 59 y.o.   MRN: [id number]  HPI: [name] is a 59 y.o. with past medical history as outlined below who presents to clinic for annual f/u. She has no complaints.   Please see problem list for status of the pt's chronic medical problems.  Past Medical History  Diagnosis Date  . Ecchymoses, spontaneous 08/12/2010  . Weight gain 08/12/2010  . Fatigue 08/12/2010  . Smoker 05/10/2010  . Bursitis of right shoulder 05/10/2010  . Inguinal pain 03/23/2010    right  . Shoulder pain, right 07/07/2009  . Mole (skin) 07/07/2009  . Back pain 07/07/2009  . Depression 05/15/2006  . GERD (gastroesophageal reflux disease) 05/15/2006  . Bradycardia 05/15/2006  . Personal history of goiter 05/15/2006   Current Outpatient Prescriptions  Medication Sig Dispense Refill  . acetaminophen (TYLENOL) 500 MG tablet Take 1 tablet (500 mg total) by mouth every 6 (six) hours as needed. 30 tablet 5  . FLUoxetine (PROZAC) 20 MG capsule Take 20 mg by mouth 3 (three) times daily.  0  . gabapentin (NEURONTIN) 300 MG capsule Take 1 capsule (300 mg total) by mouth 3 (three) times daily. 90 capsule 3  . Multiple Vitamin (MULTIVITAMIN WITH MINERALS) TABS Take 1 tablet by mouth daily.    Marland Kitchen omeprazole (PRILOSEC) 20 MG capsule Take 1 capsule (20 mg total) by mouth 2 (two) times daily. 180 capsule 3  . traMADol (ULTRAM) 50 MG tablet Take 1 tablet (50 mg total) by mouth every 6 (six) hours as needed. 90 tablet 0   No current facility-administered medications for this visit.   No family history on file. Social History   Social History  . Marital Status: Legally Separated    Spouse Name: N/A  . Number of Children: N/A  . Years of Education: N/A   Social History Main Topics  . Smoking status: Former Smoker -- 0.10 packs/day    Types: Cigarettes    Quit date: 05/25/2006  . Smokeless tobacco: None     Comment: maybe 1-2 cigarette a week  .  Alcohol Use: No  . Drug Use: No  . Sexual Activity: Not Asked   Other Topics Concern  . None   Social History Narrative   Review of Systems: Review of Systems  Constitutional: Negative for fever, chills and weight loss.  Respiratory: Negative for shortness of breath.   Cardiovascular: Negative for chest pain.  Gastrointestinal: Negative for nausea and vomiting.  Musculoskeletal: Negative.        Pain improved w/ gabapentin prescribed to her by [facility] mental health  Psychiatric/Behavioral: Positive for depression (stable).    Objective:  Physical Exam: Filed Vitals:   07/13/15 1051  BP: 129/73  Pulse: 54  Temp: 98.1 F (36.7 C)  TempSrc: Oral  Height: 5\' 7"  (1.702 m)  Weight: 188 lb 4.8 oz (85.412 kg)  SpO2: 100%   Physical Exam  Constitutional: She appears well-developed and well-nourished. No distress.  HENT:  Head: Normocephalic.  Cardiovascular: Normal rate, regular rhythm and normal heart sounds.   No murmur heard. Pulmonary/Chest: Effort normal and breath sounds normal. No respiratory distress. She has no wheezes.  Abdominal: Soft. Bowel sounds are normal.  Musculoskeletal: She exhibits no edema.  Neurological: She is alert.  Skin: Skin is warm and dry.   Assessment & Plan:   Please see problem based assessment and plan.

## 2015-07-14 NOTE — Assessment & Plan Note (Signed)
LDL 137 today. Her 10 year ASCVD risk is 10.5%. Will recommend a moderate intensity statin for patient.   - rx for pravastatin 40mg . Attempted to contact her home number but it is disconnected and cell phone number did not work.

## 2015-07-14 NOTE — Assessment & Plan Note (Signed)
Pt has pre- existing mammo referral. Informed nurse of this referral. Also had a colonoscopy in 2009, she reports she is due in 10 years. Denies blood in her stools. Followed up on BMET, CBC, and lipid panel as these have not been checked since 2013.

## 2015-07-14 NOTE — Assessment & Plan Note (Signed)
Her depression is stable. She actually reports improvement in her health now that her depression is controlled. She follows w/ [facility] and is on prozac 20mg  TID and gabapentin 300mg  TID.

## 2015-07-16 NOTE — Progress Notes (Signed)
Internal Medicine Clinic Attending  Case discussed with Dr. Truong at the time of the visit.  We reviewed the resident's history and exam and pertinent patient test results.  I agree with the assessment, diagnosis, and plan of care documented in the resident's note.  

## 2015-09-01 ENCOUNTER — Other Ambulatory Visit: Payer: Self-pay | Admitting: Internal Medicine

## 2015-09-01 DIAGNOSIS — F331 Major depressive disorder, recurrent, moderate: Secondary | ICD-10-CM | POA: Diagnosis not present

## 2016-01-19 ENCOUNTER — Encounter: Payer: Self-pay | Admitting: *Deleted

## 2016-01-20 ENCOUNTER — Ambulatory Visit (INDEPENDENT_AMBULATORY_CARE_PROVIDER_SITE_OTHER): Payer: Medicare Other | Admitting: Internal Medicine

## 2016-01-20 ENCOUNTER — Encounter: Payer: Self-pay | Admitting: Internal Medicine

## 2016-01-20 DIAGNOSIS — M5417 Radiculopathy, lumbosacral region: Secondary | ICD-10-CM

## 2016-01-20 DIAGNOSIS — M5416 Radiculopathy, lumbar region: Secondary | ICD-10-CM

## 2016-01-20 MED ORDER — TRAMADOL HCL 50 MG PO TABS: 50.0000 mg | ORAL_TABLET | Freq: Four times a day (QID) | ORAL | Status: DC | PRN

## 2016-01-20 MED ORDER — GABAPENTIN 300 MG PO CAPS: ORAL_CAPSULE | ORAL | Status: DC

## 2016-01-20 MED ORDER — GABAPENTIN 300 MG PO CAPS: 300.0000 mg | ORAL_CAPSULE | Freq: Three times a day (TID) | ORAL | Status: DC

## 2016-01-20 NOTE — Patient Instructions (Signed)
General Instructions: - Start taking Tramadol 50 mg twice daily - Hopefully this will allow you to cut down on Tylenol and Ibuprofen - Continue Gabapentin 300 mg in morning and afternoon and 600 mg at night - I provided refills for you - Follow up in 1 month to reassess your pain control  Thank you for bringing your medicines today. This helps [place] keep you safe from mistakes.   Progress Toward Treatment Goals:  Treatment Goal 11/11/2013  Stop smoking smoking less    Self Care Goals & Plans:  Self Care Goal 03/03/2014  Manage my medications take my medicines as prescribed; bring my medications to every visit; refill my medications on time; follow the sick day instructions if I am sick  Monitor my health keep track of my weight  Eat healthy foods eat more vegetables; eat fruit for snacks and desserts; eat baked foods instead of fried foods; eat foods that are low in salt; eat smaller portions  Be physically active find an activity I enjoy  Stop smoking -  Meeting treatment goals -    No flowsheet data found.   Care Management & Community Referrals:  Referral 11/11/2013  Referrals made for care management support none needed  Referrals made to community resources none

## 2016-01-20 NOTE — Progress Notes (Signed)
   Subjective:    Patient ID: [name], female    DOB: Aug 23, 1955, 60 y.o.   MRN: [id number]  HPI [name] is a 60yo woman with PMHx of hyperlipidemia, depression, and tobacco abuse who presents today for follow up of her lower back pain and bilateral leg pain.  She reports having low back pain "for many years", but her leg pain started only about 1 year ago. She had an MRI in 2013 which showed a bulging disk at L4-L5 and a disk protrusion at L5-S1. She notes over the last month the pain has worsened. She describes the pain as starting in her lower back and then radiating to both legs, but the right leg is more affected. She describes "pins and needles" sensation over her anterior thighs. She states the pain used to be less frequent, only a few times a week, but now occurs daily. She notes standing up makes the pain worse and laying down helps. She reports this last month there have been a few times when she is walking and has to stop because her legs feel weak. She denies any saddle anesthesia or loss of bowel/bladder control. She is taking Gabapentin 300 mg in the morning and afternoon and 600 mg at night, ibuprofen 200 mg 4 times daily, and Tylenol 500 mg 3-4 times daily. She states the Gabapentin helps her pain get down to a 5 or 6 out of 10, but she still feels limited in her ability to clean her home, go grocery shopping, or participate in social events. She had tried Tramadol in March of last year which she states allowed her to everything she wanted to do.     Review of Systems General: Denies fever, chills, night sweats, changes in weight, changes in appetite HEENT: Denies headaches, ear pain, changes in vision, rhinorrhea, sore throat CV: Denies CP, palpitations, SOB, orthopnea Pulm: Denies SOB, cough, wheezing GI: Denies abdominal pain, nausea, vomiting, diarrhea, constipation, melena, hematochezia GU: Denies dysuria, hematuria, frequency Msk: Denies muscle cramps Neuro: See  HPI Skin: Denies rashes, bruising Psych: Reports depression-chronic. Denies anxiety, hallucinations    Objective:   Physical Exam General: alert, sitting up, NAD HEENT: /AT, EOMI, sclera anicteric, mucus membranes moist CV: RRR, no m/g/r Pulm: CTA bilaterally, breaths non-labored Abd: BS+, soft, obese, non-tender Back: Mild tenderness to palpation of spinous processes of lumbar spine.  Ext: warm, no peripheral edema. Pain elicited with straight leg raise bilaterally, but more significantly on right side. She has pain with flexion, extension, external and internal rotation of the right hip. Mild tenderness to palpation of both legs from thighs to ankles.  Neuro: alert and oriented x 3.     Assessment & Plan:  Please refer to A&P documentation.

## 2016-01-20 NOTE — Assessment & Plan Note (Signed)
She has significant pain on exam and her ROM, especially in the right lower extremity is limited secondary to pain. Advised her to continue Gabapentin 300 mg in the morning and afternoon and then take 600 mg at night. Will start her on Tramadol 50 mg twice daily, #60 tablets. Hopefully this will allow her to decrease her Tylenol and Ibuprofen use. Will have her follow up in 1 month to reassess her pain. She may need to have Tramadol increased to TID. Once she is on a stable dose would place her on a pain contract and obtain a random UDS. Of note, towards the end of the visit she expressed to me that she has been dealing with a lot of family issues, including a brother with prior CVA that was given 2 months to live secondary to complications and a sister that was recently diagnosed with head/neck cancer. She thinks her pain has worsened secondary to these events and her depression. As she recovers from this grief, she may require less pain medication.

## 2016-01-21 NOTE — Progress Notes (Signed)
Internal Medicine Clinic Attending  Case discussed with Dr. Rivet at the time of the visit.  We reviewed the resident's history and exam and pertinent patient test results.  I agree with the assessment, diagnosis, and plan of care documented in the resident's note.  

## 2016-02-16 ENCOUNTER — Ambulatory Visit (INDEPENDENT_AMBULATORY_CARE_PROVIDER_SITE_OTHER): Payer: Medicare Other | Admitting: Internal Medicine

## 2016-02-16 ENCOUNTER — Encounter: Payer: Self-pay | Admitting: Internal Medicine

## 2016-02-16 VITALS — BP 115/55 | Temp 98.1°F | Ht 67.0 in | Wt 191.5 lb

## 2016-02-16 DIAGNOSIS — L304 Erythema intertrigo: Secondary | ICD-10-CM

## 2016-02-16 DIAGNOSIS — M79671 Pain in right foot: Secondary | ICD-10-CM | POA: Diagnosis not present

## 2016-02-16 DIAGNOSIS — F329 Major depressive disorder, single episode, unspecified: Secondary | ICD-10-CM

## 2016-02-16 DIAGNOSIS — M5416 Radiculopathy, lumbar region: Secondary | ICD-10-CM

## 2016-02-16 DIAGNOSIS — F32A Depression, unspecified: Secondary | ICD-10-CM

## 2016-02-16 DIAGNOSIS — M79673 Pain in unspecified foot: Secondary | ICD-10-CM | POA: Insufficient documentation

## 2016-02-16 DIAGNOSIS — Z1239 Encounter for other screening for malignant neoplasm of breast: Secondary | ICD-10-CM

## 2016-02-16 MED ORDER — TRAMADOL HCL 50 MG PO TABS
50.0000 mg | ORAL_TABLET | Freq: Four times a day (QID) | ORAL | 0 refills | Status: DC | PRN
Start: 2016-02-16 — End: 2016-03-11

## 2016-02-16 MED ORDER — NYSTATIN 100000 UNIT/GM EX POWD: Freq: Three times a day (TID) | CUTANEOUS | 0 refills | Status: AC

## 2016-02-16 NOTE — Patient Instructions (Addendum)
It was a pleasure meeting you today and I look forward to getting to know you!  1. Today we talked about your low back pain and knee pain. We will continue the Tramadol twice daily as you have found some significant improvement in your pain. 2. Today we also talked about your depression and significant stressors you have in your life. Right now your depression is well managed with Prozac and speaking to a therapist however please make an appointment with me if things change. 3. Today we also talked about getting a screening mammogram. I've ordered a mammogram and we will follow up on your results at your next visit in 1 month! 4. You also complained of a rash. Please apply the Nystatin powder 3 times daily for 2 weeks!  Take care! I'm here if you need me.

## 2016-02-16 NOTE — Progress Notes (Signed)
   CC: follow up on low back pain, right foot pain and rash.  HPI:  [name] is a 60 y.o. pleasant female who presents to the clinic for follow up evaluation of low back pain. The patient complains of gradually worsening chronic low back pain with radiation to her legs (R>L). The pain is worse with walking and standing and is relieved with laying down. She was started on Tramadol 50mg  BID last month and reports improvement in pain from 9/10 to 6/10. The patient also reports a 1 month history of lateral right foot pain that began when she was stepping down. She reports her foot was swollen considerably when it first occurred. She now reports a tender knot on her lateral right foot made worse by walking.  The patient also complains of a rash under her breasts. She notes its itchy and appears during the summer when she perspires more. She reports having this before and resolved with an "antifungal" cream. The patient reports considerable stressors at home but her depression is well controlled on Prozac.   Past Medical History:  Diagnosis Date  . Back pain 07/07/2009  . Bradycardia 05/15/2006  . Bursitis of right shoulder 05/10/2010  . Depression 05/15/2006  . Ecchymoses, spontaneous 08/12/2010  . Fatigue 08/12/2010  . GERD (gastroesophageal reflux disease) 05/15/2006  . Inguinal pain 03/23/2010   right  . Mole (skin) 07/07/2009  . Personal history of goiter 05/15/2006  . Shoulder pain, right 07/07/2009  . Smoker 05/10/2010  . Weight gain 08/12/2010     Review of Systems  Constitutional: Negative for chills, fever and weight loss.  Respiratory: Negative for shortness of breath.   Cardiovascular: Positive for leg swelling. Negative for chest pain and orthopnea.  Gastrointestinal: Negative for abdominal pain, heartburn, nausea and vomiting.  Genitourinary: Negative for dysuria.  Musculoskeletal: Positive for back pain and joint pain. Negative for falls.  Skin: Positive for rash.    Neurological: Negative for weakness.  Psychiatric/Behavioral: Positive for depression.    Physical Exam  Constitutional: She is well-developed, well-nourished, and in no distress.  Eyes: Pupils are equal, round, and reactive to light.  Cardiovascular: Normal rate and regular rhythm.   No murmur heard. Pulmonary/Chest: Effort normal and breath sounds normal.  Abdominal: Soft.  Musculoskeletal: She exhibits tenderness.  Right foot: Pinpoint tenderness to palpation of lateral foot. Some swelling present as well.  Back: showed no tenderness to palpation.  Skin: Skin is warm and dry. Rash noted.  Rash under BL breasts, reports its worse in the summer.     Vitals:   02/16/16 1341  BP: (!) 115/55  Temp: 98.1 F (36.7 C)  TempSrc: Oral  SpO2: 100%  Weight: 86.9 kg (191 lb 8 oz)  Height: 5\' 7"  (1.702 m)     Assessment & Plan:   See Encounters Tab for problem based charting.   Patient seen with Dr. Angelia Mould

## 2016-02-16 NOTE — Assessment & Plan Note (Signed)
Nystatin powder TID given. Will assess improvement at next visit.

## 2016-02-16 NOTE — Progress Notes (Signed)
Internal Medicine Clinic Attending  I saw and evaluated the patient.  I personally confirmed the key portions of the history and exam documented by Dr. Molt and I reviewed pertinent patient test results.  The assessment, diagnosis, and plan were formulated together and I agree with the documentation in the resident's note. 

## 2016-02-16 NOTE — Assessment & Plan Note (Signed)
Patients pain improved from 9/10 to 6/10 with trial of Tramadol BID. She reports some improvement in her functional activities as well. Refill for Tramadol given. Patient counseled on the benefits of using heat or ice, weight loss and exercise for her pain control.

## 2016-02-16 NOTE — Assessment & Plan Note (Signed)
Patient reports her depression is stable at this time. Will continue to monitor patient. She see's a therapist regularly for this as well.

## 2016-02-16 NOTE — Assessment & Plan Note (Signed)
Patient reports she has never had a mammogram. Screening mammogram ordered for evaluation.

## 2016-02-16 NOTE — Assessment & Plan Note (Addendum)
X-ray right foot ordered as patient has discrete tenderness on right lateral foot.

## 2016-03-11 ENCOUNTER — Ambulatory Visit (INDEPENDENT_AMBULATORY_CARE_PROVIDER_SITE_OTHER): Payer: Medicare Other | Admitting: Internal Medicine

## 2016-03-11 VITALS — BP 111/60 | HR 63 | Temp 98.6°F | Ht 67.0 in | Wt 188.6 lb

## 2016-03-11 DIAGNOSIS — L304 Erythema intertrigo: Secondary | ICD-10-CM | POA: Diagnosis not present

## 2016-03-11 DIAGNOSIS — M5417 Radiculopathy, lumbosacral region: Secondary | ICD-10-CM

## 2016-03-11 DIAGNOSIS — M25561 Pain in right knee: Secondary | ICD-10-CM

## 2016-03-11 DIAGNOSIS — F329 Major depressive disorder, single episode, unspecified: Secondary | ICD-10-CM

## 2016-03-11 DIAGNOSIS — M25562 Pain in left knee: Secondary | ICD-10-CM

## 2016-03-11 DIAGNOSIS — M5416 Radiculopathy, lumbar region: Secondary | ICD-10-CM

## 2016-03-11 DIAGNOSIS — F32A Depression, unspecified: Secondary | ICD-10-CM

## 2016-03-11 MED ORDER — TRAMADOL HCL 50 MG PO TABS: 50.0000 mg | ORAL_TABLET | Freq: Four times a day (QID) | ORAL | 0 refills | Status: DC | PRN

## 2016-03-11 NOTE — Patient Instructions (Addendum)
It was a pleasure seeing you today!   1. Today we talked about your pain. I'm glad your pain is well controlled on Tramadol and Gabapentin. Today we talked about taking Gabapentin daily and using Tramadol only as needed and trying to make this prescription for 60 pills last over the next 2 months.  2. Today we also talked about your depression and life stressors. I'm glad your symptoms are well controlled with Prozac. Please continue taking as instructed.   Chronic Back Pain  When back pain lasts longer than 3 months, it is called chronic back pain.People with chronic back pain often go through certain periods that are more intense (flare-ups).  CAUSES Chronic back pain can be caused by wear and tear (degeneration) on different structures in your back. These structures include:  The bones of your spine (vertebrae) and the joints surrounding your spinal cord and nerve roots (facets).  The strong, fibrous tissues that connect your vertebrae (ligaments). Degeneration of these structures may result in pressure on your nerves. This can lead to constant pain. HOME CARE INSTRUCTIONS  Avoid bending, heavy lifting, prolonged sitting, and activities which make the problem worse.  Take brief periods of rest throughout the day to reduce your pain. Lying down or standing usually is better than sitting while you are resting.  Take over-the-counter or prescription medicines only as directed by your caregiver. SEEK IMMEDIATE MEDICAL CARE IF:   You have weakness or numbness in one of your legs or feet.  You have trouble controlling your bladder or bowels.  You have nausea, vomiting, abdominal pain, shortness of breath, or fainting.   This information is not intended to replace advice given to you by your health care provider. Make sure you discuss any questions you have with your health care provider.   Document Released: 08/18/2004 Document Revised: 10/03/2011 Document Reviewed: 12/29/2014 Elsevier  Interactive Patient Education [employer].

## 2016-03-11 NOTE — Progress Notes (Signed)
   CC: follow up on back and knee pain.  HPI:  [name] is a very pleasasnt 60 y.o. female who presents for a 1 month follow up of her back and BL knee pain. She was started on Tramadol 2 months ago for pain management and reports significant improvement of her symptoms since that time. She reports her current pain management involves taking Gabapentin daily as well as Tramadol as needed for pain. She reports taking Tramadol most days however skips days when her pain isn't as significant.  She also reports her depression is stable on her dose of Prozac. She again reports multiple life stressors mostly centered around her families health. Reports mother with recent MI, sister with CA and brother sick as well.  Past Medical History:  Diagnosis Date  . Back pain 07/07/2009  . Bradycardia 05/15/2006  . Bursitis of right shoulder 05/10/2010  . Depression 05/15/2006  . Ecchymoses, spontaneous 08/12/2010  . Fatigue 08/12/2010  . GERD (gastroesophageal reflux disease) 05/15/2006  . Inguinal pain 03/23/2010   right  . Mole (skin) 07/07/2009  . Personal history of goiter 05/15/2006  . Shoulder pain, right 07/07/2009  . Smoker 05/10/2010  . Weight gain 08/12/2010    Review of Systems:  Review of Systems  Constitutional: Negative for chills, fever and weight loss.  Eyes: Negative for blurred vision and double vision.  Respiratory: Negative for cough and shortness of breath.   Cardiovascular: Negative for chest pain and leg swelling.  Gastrointestinal: Negative for abdominal pain, blood in stool, constipation, diarrhea, nausea and vomiting.  Genitourinary: Negative for dysuria.  Musculoskeletal: Positive for back pain, joint pain and neck pain. Negative for myalgias.  Skin: Positive for rash.  Neurological: Negative for weakness and headaches.   Physical Exam: Physical Exam  Constitutional: She appears distressed.  HENT:  Head: Normocephalic and atraumatic.  Cardiovascular: Normal  rate, regular rhythm and normal heart sounds.   Pulmonary/Chest: Effort normal and breath sounds normal. No respiratory distress.  Abdominal: Soft. Bowel sounds are normal. She exhibits no distension.  Musculoskeletal: She exhibits tenderness.       Right knee: She exhibits decreased range of motion. She exhibits no swelling and no effusion. Tenderness found.       Left knee: She exhibits decreased range of motion. She exhibits no swelling and no effusion. Tenderness found.       Lumbar back: She exhibits tenderness and pain. She exhibits no swelling and no edema.  Skin: Skin is warm and dry.    Vitals:   03/11/16 0919  BP: 111/60  Pulse: 63  Temp: 98.6 F (37 C)  TempSrc: Oral  SpO2: 99%  Weight: 188 lb 9.6 oz (85.5 kg)  Height: 5\' 7"  (1.702 m)    Assessment & Plan:   See Encounters Tab for problem based charting.  Patient seen with Dr. Evette Doffing

## 2016-03-11 NOTE — Addendum Note (Signed)
Addended by: Tamsen Roers on: 03/11/2016 04:15 PM   Modules accepted: Orders

## 2016-03-11 NOTE — Assessment & Plan Note (Signed)
Pt reports symptoms well controled despite numerous life stressors. Continuing Prozac 20mg  TID.

## 2016-03-11 NOTE — Assessment & Plan Note (Signed)
Pt reports improvement of under-breast intertrigo with use of Nystatin.

## 2016-03-11 NOTE — Assessment & Plan Note (Addendum)
Pt reports good control of pain and improvement of her functional abilities with Gabapentin and Tramadol. Good discussion was had today about the chronic nature and waxing and waning course of pain. We also discussed continuing taking Gabapentin daily and using Tramadol only as needed for her pain. She verbalized understanding of this and was agreeable to this plan.  We discussed the importance of staying active and weight loss as this should help control her pain as well.  Refill given.

## 2016-03-15 NOTE — Progress Notes (Signed)
Internal Medicine Clinic Attending  I saw and evaluated the patient.  I personally confirmed the key portions of the history and exam documented by Dr. Molt and I reviewed pertinent patient test results.  The assessment, diagnosis, and plan were formulated together and I agree with the documentation in the resident's note. 

## 2016-04-13 ENCOUNTER — Ambulatory Visit (INDEPENDENT_AMBULATORY_CARE_PROVIDER_SITE_OTHER): Payer: Medicare Other | Admitting: *Deleted

## 2016-04-13 DIAGNOSIS — Z23 Encounter for immunization: Secondary | ICD-10-CM | POA: Diagnosis not present

## 2016-05-09 ENCOUNTER — Telehealth: Payer: Self-pay | Admitting: Internal Medicine

## 2016-05-09 NOTE — Telephone Encounter (Signed)
APT. REMINDER CALL, NO ANSWER, NO VOICEMAIL °

## 2016-05-10 ENCOUNTER — Encounter: Payer: Self-pay | Admitting: Internal Medicine

## 2016-05-10 ENCOUNTER — Ambulatory Visit (INDEPENDENT_AMBULATORY_CARE_PROVIDER_SITE_OTHER): Payer: Medicare Other | Admitting: Internal Medicine

## 2016-05-10 VITALS — BP 130/68 | HR 56 | Temp 97.8°F | Ht 67.0 in | Wt 189.3 lb

## 2016-05-10 DIAGNOSIS — M5416 Radiculopathy, lumbar region: Secondary | ICD-10-CM

## 2016-05-10 DIAGNOSIS — M25561 Pain in right knee: Secondary | ICD-10-CM

## 2016-05-10 DIAGNOSIS — F329 Major depressive disorder, single episode, unspecified: Secondary | ICD-10-CM | POA: Diagnosis not present

## 2016-05-10 DIAGNOSIS — M5116 Intervertebral disc disorders with radiculopathy, lumbar region: Secondary | ICD-10-CM | POA: Diagnosis not present

## 2016-05-10 DIAGNOSIS — K219 Gastro-esophageal reflux disease without esophagitis: Secondary | ICD-10-CM | POA: Diagnosis not present

## 2016-05-10 DIAGNOSIS — L304 Erythema intertrigo: Secondary | ICD-10-CM

## 2016-05-10 DIAGNOSIS — M25562 Pain in left knee: Secondary | ICD-10-CM

## 2016-05-10 DIAGNOSIS — Z79899 Other long term (current) drug therapy: Secondary | ICD-10-CM

## 2016-05-10 DIAGNOSIS — G8929 Other chronic pain: Secondary | ICD-10-CM

## 2016-05-10 DIAGNOSIS — F3340 Major depressive disorder, recurrent, in remission, unspecified: Secondary | ICD-10-CM

## 2016-05-10 DIAGNOSIS — M79671 Pain in right foot: Secondary | ICD-10-CM

## 2016-05-10 DIAGNOSIS — Z1239 Encounter for other screening for malignant neoplasm of breast: Secondary | ICD-10-CM

## 2016-05-10 MED ORDER — TRAMADOL HCL 50 MG PO TABS: 50.0000 mg | ORAL_TABLET | Freq: Four times a day (QID) | ORAL | 0 refills | Status: DC | PRN

## 2016-05-10 MED ORDER — TRAMADOL HCL 50 MG PO TABS: 50.0000 mg | ORAL_TABLET | Freq: Four times a day (QID) | ORAL | 2 refills | Status: DC | PRN

## 2016-05-10 NOTE — Assessment & Plan Note (Addendum)
Order given at last visit for screening mammogram however was not completed. Patient encouraged to complete this important screening examination. She reports she occasionally performs self breast exams and has not noticed any abnormalities. -Follow-up mammogram results

## 2016-05-10 NOTE — Assessment & Plan Note (Signed)
Patient has had resolution of her bilateral knee pain since steroid injection by sports medicine. She was encouraged to follow up with sports medicine if her pain does recur for further injections.

## 2016-05-10 NOTE — Assessment & Plan Note (Signed)
Resolved with use of nystatin.

## 2016-05-10 NOTE — Assessment & Plan Note (Addendum)
Patient has a history of chronic low back pain likely secondary to significant facet disease and disc protrusions with possible impingement of L4 and S1 as seen on MRI from 2013. She reports her pain is worse with walking, standing and laying flat on her back. She was restarted on tramadol 50 mg 1-2 a day and has noted improvement of her pain since that time. She reports she is able to complete more of her activities of daily living and live a normal life. The possibility of future spinal injections if pain gets worse was discussed with the patient however have been deferred at this time. -Refill given for tramadol 50 mg #60 with 2 refills. Patient was instructed that this prescription will need to last until her next visit in 3 months. At her next visit, we will need to reassess her tramadol use and insure adequate pain control and appropriate dosing. -Continue gabapentin -She was instructed to continue to exercise and participate in yoga or other similar routines. She was also encouraged to use heat or ice, whichever improves her pain.

## 2016-05-10 NOTE — Progress Notes (Signed)
   CC: Follow-up evaluation of back pain  HPI:  [name] is a very pleasant 60 y.o. female who presents for follow-up evaluation of chronic low back pain secondary to lumbar degenerative disc disease with radiculopathy,s/p steroid injection and depression. For her chronic low back pain, Patient was started on Tramadol 50 mg twice a day approximately 4 months ago and has noticed improvement of her pain with this medication. She reports she takes tramadol nearly every day and has only been able to skip one or 2 days since starting. She reports taking 1-2 a day depending on severity of medication and tries to take this only when the pain is unbearable. She reports she is able to complete more of her activities of daily living since addition of this medicine. She reports the pain is made worse by walking, standing, and laying on her back. Patient also complains of a long history of major depressive disorder well controlled on her current dose of Prozac. She endorses numerous life stressors including major illness in her mother, brother and sister. Since her last visit, she reports her family appears to be improving.  Past Medical History:  Diagnosis Date  . Back pain 07/07/2009  . Bradycardia 05/15/2006  . Bursitis of right shoulder 05/10/2010  . Depression 05/15/2006  . Ecchymoses, spontaneous 08/12/2010  . Fatigue 08/12/2010  . GERD (gastroesophageal reflux disease) 05/15/2006  . Inguinal pain 03/23/2010   right  . Mole (skin) 07/07/2009  . Personal history of goiter 05/15/2006  . Shoulder pain, right 07/07/2009  . Smoker 05/10/2010  . Weight gain 08/12/2010    Review of Systems:  Review of Systems  Constitutional: Negative for chills, fever and weight loss.  Respiratory: Negative for cough and shortness of breath.   Cardiovascular: Negative for chest pain, palpitations and leg swelling.  Gastrointestinal: Negative for abdominal pain, constipation, diarrhea, nausea and vomiting.    Musculoskeletal: Positive for back pain and joint pain. Negative for falls.  Neurological: Negative for dizziness, sensory change, focal weakness and headaches.  Psychiatric/Behavioral: Positive for depression. Negative for suicidal ideas. The patient is not nervous/anxious.     Physical Exam: Physical Exam  Constitutional: She is oriented to person, place, and time and well-developed, well-nourished, and in no distress.  HENT:  Head: Normocephalic and atraumatic.  Cardiovascular: Normal rate and regular rhythm.   No murmur heard. Pulmonary/Chest: Effort normal and breath sounds normal. No respiratory distress. She has no wheezes.  Abdominal: Soft. Bowel sounds are normal. She exhibits no distension. There is no tenderness.  Musculoskeletal: She exhibits no tenderness or deformity.  No tenderness to palpation of lumbar spine  Neurological: She is alert and oriented to person, place, and time. She exhibits normal muscle tone.  Skin: Skin is warm and dry. She is not diaphoretic.    Vitals:   05/10/16 1319  BP: 130/68  Pulse: (!) 56  Temp: 97.8 F (36.6 C)  TempSrc: Oral  SpO2: 98%  Weight: 189 lb 4.8 oz (85.9 kg)  Height: 5\' 7"  (1.702 m)    Assessment & Plan:   See Encounters Tab for problem based charting.  Patient seen with Dr. Evette Doffing

## 2016-05-10 NOTE — Assessment & Plan Note (Signed)
Patient reports her depression is well controlled on current therapy of Prozac 20 mg 3 times a day. She does endorse considerable life stressors including numerous family members who are ill however reports she is coping well. She reports she sees a mental health professional regularly which helps her with her symptoms as well. She reports strong support system at home in addition. -Continue Prozac 20 mg 3 times a day -Continue following up with mental health

## 2016-05-10 NOTE — Assessment & Plan Note (Signed)
Resolved

## 2016-05-10 NOTE — Patient Instructions (Addendum)
It was an absolute pleasure seeing you again today! I'm glad things are going well.  1. Today we talked about her chronic back pain. I'm glad it's improving with the use of tramadol and you've been doing a good job using the tramadol as needed. Please continue to only take this medication when necessary. You may try using an anti-inflammatory medication such as naproxen or Tylenol, this may help with chronic pain as well. Please try to use heat or ice, whichever feels best. 2. Today we talked about your bilateral knee pain. I'm glad this improved with injection at the sports medicine office related issue. Please return to sports medicine office for follow-up injection if the pain recurs. 3. Today we also talked about her depression. I'm glad it's well-controlled on your current dose of Prozac. Please continue to see mental health often for their services. 4. Like to see you again in 2-3 months to see how your pain is doing. 5. Please complete the mammogram that I have ordered. You are a little behind in mammograms.

## 2016-05-10 NOTE — Assessment & Plan Note (Signed)
Patient reports her acid reflux is well controlled with current Prilosec 20 mg. -Continue current PPI therapy

## 2016-05-11 NOTE — Progress Notes (Signed)
Internal Medicine Clinic Attending  I saw and evaluated the patient.  I personally confirmed the key portions of the history and exam documented by Dr. Molt and I reviewed pertinent patient test results.  The assessment, diagnosis, and plan were formulated together and I agree with the documentation in the resident's note. 

## 2016-07-25 ENCOUNTER — Encounter: Payer: Self-pay | Admitting: Internal Medicine

## 2016-07-25 DIAGNOSIS — F1120 Opioid dependence, uncomplicated: Secondary | ICD-10-CM | POA: Insufficient documentation

## 2016-08-03 ENCOUNTER — Other Ambulatory Visit: Payer: Self-pay | Admitting: Internal Medicine

## 2016-08-03 DIAGNOSIS — E785 Hyperlipidemia, unspecified: Secondary | ICD-10-CM

## 2016-08-16 ENCOUNTER — Encounter: Payer: Medicare Other | Admitting: Internal Medicine

## 2016-08-24 ENCOUNTER — Ambulatory Visit (INDEPENDENT_AMBULATORY_CARE_PROVIDER_SITE_OTHER): Payer: Medicare Other | Admitting: Internal Medicine

## 2016-08-24 VITALS — BP 112/69 | HR 52 | Temp 98.5°F | Wt 192.4 lb

## 2016-08-24 DIAGNOSIS — F329 Major depressive disorder, single episode, unspecified: Secondary | ICD-10-CM | POA: Diagnosis not present

## 2016-08-24 DIAGNOSIS — E559 Vitamin D deficiency, unspecified: Secondary | ICD-10-CM | POA: Diagnosis not present

## 2016-08-24 DIAGNOSIS — K219 Gastro-esophageal reflux disease without esophagitis: Secondary | ICD-10-CM | POA: Diagnosis not present

## 2016-08-24 DIAGNOSIS — M5416 Radiculopathy, lumbar region: Secondary | ICD-10-CM | POA: Diagnosis not present

## 2016-08-24 DIAGNOSIS — G8929 Other chronic pain: Secondary | ICD-10-CM

## 2016-08-24 DIAGNOSIS — M170 Bilateral primary osteoarthritis of knee: Secondary | ICD-10-CM | POA: Diagnosis not present

## 2016-08-24 DIAGNOSIS — M791 Myalgia: Secondary | ICD-10-CM

## 2016-08-24 DIAGNOSIS — Z79899 Other long term (current) drug therapy: Secondary | ICD-10-CM | POA: Diagnosis not present

## 2016-08-24 DIAGNOSIS — M5417 Radiculopathy, lumbosacral region: Secondary | ICD-10-CM | POA: Diagnosis not present

## 2016-08-24 MED ORDER — TRAMADOL HCL 50 MG PO TABS: 50.0000 mg | ORAL_TABLET | Freq: Two times a day (BID) | ORAL | 0 refills | Status: DC | PRN

## 2016-08-24 MED ORDER — OMEPRAZOLE 20 MG PO CPDR: 20.0000 mg | DELAYED_RELEASE_CAPSULE | Freq: Two times a day (BID) | ORAL | 3 refills | Status: DC

## 2016-08-24 NOTE — Progress Notes (Signed)
   CC: Chronic back and leg pain  HPI:  [name] is a 61 y.o. woman here for evaluation of her longstanding back and leg pain that is somewhat increased for about 3 weeks.  See problem based assessment and plan below for additional details  Past Medical History:  Diagnosis Date  . Back pain 07/07/2009  . Bradycardia 05/15/2006  . Bursitis of right shoulder 05/10/2010  . Depression 05/15/2006  . Ecchymoses, spontaneous 08/12/2010  . Fatigue 08/12/2010  . GERD (gastroesophageal reflux disease) 05/15/2006  . Inguinal pain 03/23/2010   right  . Mole (skin) 07/07/2009  . Personal history of goiter 05/15/2006  . Shoulder pain, right 07/07/2009  . Smoker 05/10/2010  . Weight gain 08/12/2010    Review of Systems:  Review of Systems  Cardiovascular: Negative for leg swelling.  Musculoskeletal: Positive for back pain and joint pain. Negative for falls.  Neurological: Positive for sensory change. Negative for dizziness and weakness.  Endo/Heme/Allergies: Does not bruise/bleed easily.    Physical Exam: Physical Exam  Constitutional: She is well-developed, well-nourished, and in no distress.  Cardiovascular: Normal rate and regular rhythm.   Pulmonary/Chest: Effort normal and breath sounds normal.  Musculoskeletal:  Normal ROM intact, with some pain on abduction of the left shoulder above horizontal No lower extremity edema or sensory deficit Knees nontender with no effusion Tenderness to palpation over lateral hips and thighs  Skin: Skin is warm and dry.    Vitals:   08/24/16 1028  BP: 112/69  Pulse: (!) 52  Temp: 98.5 F (36.9 C)  TempSrc: Oral  SpO2: 97%  Weight: 192 lb 6.4 oz (87.3 kg)    Assessment & Plan:   See Encounters Tab for problem based charting.  Patient discussed with Dr. Dareen Piano

## 2016-08-24 NOTE — Patient Instructions (Addendum)
It was a pleasure to see you today Ms. Grawe.  I think your back and leg pain are worsened due to decreased use and flexibility. I will refer you to see physical therapy for exercises and recommendations to work on this problem. Hopefully we can get you more mobile.  I am checking a vitamin D level today since this is frequently low and can worsen underlying neuropathic (nerve) pain.  I suspect your shoulder may have a problem with the rotator cuff. Physical therapy can help with this as well but we can check it with ultrasound at the next visit. I would like to schedule you with Dr. Evette Doffing in the clinic otherwise we may need to send you to sports medicine clinic for them to evaluate.

## 2016-08-25 LAB — VITAMIN D 25 HYDROXY (VIT D DEFICIENCY, FRACTURES): Vit D, 25-Hydroxy: 12.3 ng/mL — ABNORMAL LOW (ref 30.0–100.0)

## 2016-08-29 ENCOUNTER — Encounter: Payer: Self-pay | Admitting: Internal Medicine

## 2016-08-29 ENCOUNTER — Telehealth: Payer: Self-pay | Admitting: Internal Medicine

## 2016-08-29 DIAGNOSIS — E559 Vitamin D deficiency, unspecified: Secondary | ICD-10-CM | POA: Insufficient documentation

## 2016-08-29 MED ORDER — VITAMIN D3 25 MCG (1000 UNIT) PO TABS
1000.0000 [IU] | ORAL_TABLET | Freq: Every day | ORAL | 2 refills | Status: DC
Start: 2016-08-29 — End: 2022-04-12

## 2016-08-29 NOTE — Assessment & Plan Note (Addendum)
HPI: She has very chronic back and leg pain currently bothering her in both legs. She seems to be getting a good improvement and maintaining functional status with gabapentin and tramadol although still has daily pain. She feels previous steroid injection of both knees at sports medicine only helped her maybe a month. She has not been particularly physically active. There is no specific event or time point she noticed worsening of her symptoms. Her daughter was present who is very supportive and has apparently been trying to encourage her exercising at the senior center but has not started yet. Her depression seems reasonably well controlled but this could be contributing somewhat to her chronic pain experience.  A: Chronic lower back pain with radiculopathy with some superimposed musculoskeletal pain Currently reasonably controlled on daily gabapentin and tramadol. I think she can get a large benefit with increased activity and strengthening since it does seem to be some inflexibility and musculoskeletal components to her pain such as her knee OA. I also want to test for vitamin D deficiency given a component of neuropathic pain and what sounds like a diet and outdoor exposures that are somewhat deficient.  P: -Referral to physical therapy -Check vitamin D level and if low will recommend oral supplementation

## 2016-08-29 NOTE — Progress Notes (Signed)
Internal Medicine Clinic Attending  Case discussed with Dr. Rice at the time of the visit.  We reviewed the resident's history and exam and pertinent patient test results.  I agree with the assessment, diagnosis, and plan of care documented in the resident's note.  

## 2016-08-29 NOTE — Telephone Encounter (Signed)
I called [name] and informed her about the low vitamin D level on her lab test from Wednesday last week. She expressed understanding of this result and the plan to start a daily oral supplement with 1000 units of vitamin D3.

## 2016-08-29 NOTE — Assessment & Plan Note (Signed)
This problem is not in exacerbation but she requests her omeprazole prescription reordered.  -Continue omeprazole 20mg  daily

## 2016-08-30 ENCOUNTER — Ambulatory Visit: Payer: Medicare Other | Attending: Internal Medicine | Admitting: Physical Therapy

## 2016-08-30 NOTE — Addendum Note (Signed)
Addended by: Hulan Fray on: 08/30/2016 06:52 PM   Modules accepted: Orders

## 2016-09-09 ENCOUNTER — Ambulatory Visit (INDEPENDENT_AMBULATORY_CARE_PROVIDER_SITE_OTHER): Payer: Medicare Other | Admitting: Internal Medicine

## 2016-09-09 DIAGNOSIS — G8929 Other chronic pain: Secondary | ICD-10-CM | POA: Diagnosis not present

## 2016-09-09 DIAGNOSIS — M7552 Bursitis of left shoulder: Secondary | ICD-10-CM | POA: Diagnosis not present

## 2016-09-09 DIAGNOSIS — M67912 Unspecified disorder of synovium and tendon, left shoulder: Secondary | ICD-10-CM

## 2016-09-09 DIAGNOSIS — M7582 Other shoulder lesions, left shoulder: Secondary | ICD-10-CM

## 2016-09-09 DIAGNOSIS — M545 Low back pain: Secondary | ICD-10-CM

## 2016-09-09 DIAGNOSIS — M25569 Pain in unspecified knee: Secondary | ICD-10-CM

## 2016-09-09 DIAGNOSIS — M75112 Incomplete rotator cuff tear or rupture of left shoulder, not specified as traumatic: Secondary | ICD-10-CM

## 2016-09-09 DIAGNOSIS — E559 Vitamin D deficiency, unspecified: Secondary | ICD-10-CM | POA: Diagnosis not present

## 2016-09-09 HISTORY — DX: Unspecified disorder of synovium and tendon, left shoulder: M67.912

## 2016-09-09 NOTE — Patient Instructions (Signed)
[name] it was a pleasure seeing you today! Today we did a steroid injection into your left shoulder (subacromial bursa). The ultrasound we did in clinic showed some wear and tear of your rotator cuff. Hopefully this injection will begin to provide you some relief! You may take anti-inflammatory medications like Ibuprofen or Naproxen to help with your chronic shoulder pain.  After your injection, its important that you do not overuse your shoulder!! Overuse too soon after the injection can lead to bad results.  Please see [place] back in 3 months to see how you are doing! Please work with physical therapy regarding your back pain and shoulder pain!

## 2016-09-09 NOTE — Assessment & Plan Note (Addendum)
POC US showed wear and tear of the rotator cuff, including a partial tear of the supraspinatus. Left subacromial bursa steroid injection performed today without apparent complication. Patient noted significant improvement of her pain about 2 minutes after the injection. Was counseled about not overusing the joint for the next 3-4 days.  -s/p left shoulder triamcinolone injection -patient counseled on post-injection care -May use NSAIDs or tylenol -Limit further injections to <3 a year -Has appt with physical therapy next Thursday for this and for chronic low back pain

## 2016-09-09 NOTE — Progress Notes (Addendum)
Internal Medicine Clinic Attending  I saw and evaluated the patient.  I personally confirmed the key portions of the history and exam documented by Dr. Danford Bad and I reviewed pertinent patient test results.  The assessment, diagnosis, and plan were formulated together and I agree with the documentation in the resident's note.   Left shoulder pain for over two months, not relieved with conservative therapy. Nocturnal symptoms, no cervical pain. Left arm with mild weakness against resistance with abduction, normal internal and external rotation. POCUS of the left shoulder showed thickened and heterogenous subscapularis and supraspinatous tendon with partial tear of the supraspinatous. We provided a steroid injection into the subacromial bursa for symptom relief of the tendinopathy. I was present for the entirety of the procedure. Ultrasound was used in the procedure for direct visualization of needle placement.   Left shoulder, supaspinatous with parial underside tear, small fluid collection.

## 2016-09-09 NOTE — Progress Notes (Signed)
CC: follow-up of chronic apin  HPI:  [name] is a 61 y.o. F who presents to the acute care clinic today for follow-up of chronic low back and knee pain. She also has complaints of a 2 month history of left shoulder pain. Described as dull with occasional sharpness and is worse at night. Endorses associated numbness from her antecubital fossa to the tips of all of her fingers.   She was seen here 2 weeks ago at which time she complained of a 3 week hx of somewhat worsened chronic pain. Her pain is usually moderately controlled with Gabapentin and Tramadol. At that visit, she was referred for physical therapy and a vitamin D level was also obtained. Vitamin D pretty low at 12.3 and supplementation with Vitamin D 1000 units daily was started. Since starting this supplementation, she reports that her energy level has improved dramatically.    Past Medical History:  Diagnosis Date  . Back pain 07/07/2009  . Bradycardia 05/15/2006  . Bursitis of right shoulder 05/10/2010  . Depression 05/15/2006  . Ecchymoses, spontaneous 08/12/2010  . Fatigue 08/12/2010  . GERD (gastroesophageal reflux disease) 05/15/2006  . Inguinal pain 03/23/2010   right  . Mole (skin) 07/07/2009  . Personal history of goiter 05/15/2006  . Shoulder pain, right 07/07/2009  . Smoker 05/10/2010  . Weight gain 08/12/2010    Review of Systems:  Review of Systems  Constitutional: Positive for malaise/fatigue (improved). Negative for chills and fever.  Respiratory: Negative for cough, shortness of breath and wheezing.   Cardiovascular: Negative for chest pain, palpitations and leg swelling.  Gastrointestinal: Negative for abdominal pain, nausea and vomiting.  Musculoskeletal: Positive for back pain, joint pain and myalgias. Negative for falls and neck pain.  Neurological: Positive for tingling. Negative for dizziness, sensory change, focal weakness and headaches.   Physical Exam: Physical Exam  Constitutional:  She is oriented to person, place, and time. She appears well-developed and well-nourished. No distress.  HENT:  Head: Normocephalic and atraumatic.  Neck: Normal range of motion.  No paraspinal muscle tenderness or spasm appreciated. No increased warmth or erythema.   Cardiovascular: Normal rate, regular rhythm and normal heart sounds.   Pulmonary/Chest: Effort normal and breath sounds normal. No respiratory distress.  Abdominal: Soft. Bowel sounds are normal. She exhibits no distension (obese abdomen).  Musculoskeletal: She exhibits tenderness (Left shoulder just inferior and posterior to the [facility] joint). She exhibits no edema.  Patient has intact ROM of left shoulder. Strength intact except for weakness with aBduction. Could not elicit numbness with Tinels sign at medial or lateral elbow as well as left wrist.   Neurological: She is alert and oriented to person, place, and time. No sensory deficit.  Skin: Skin is warm and dry. She is not diaphoretic.  Psychiatric: She has a normal mood and affect. Her behavior is normal.   Vitals:   09/09/16 1317  BP: 117/60  Pulse: (!) 56  Temp: 98.2 F (36.8 C)  TempSrc: Oral  SpO2: 97%  Weight: 189 lb 11.2 oz (86 kg)  Height: 5\' 7"  (1.702 m)   POC Left Shoulder [place]: Showed tendinopathy of multiple rotator cuff tendons as well as partial tear of supraspinatus. No joint effusion appreciated.   Assessment & Plan:   See Encounters Tab for problem based charting.  Patient seen with Dr. Evette Doffing   PROCEDURE NOTE  PROCEDURE: Left shoulder steroid injection.  PREOPERATIVE DIAGNOSIS: Subacromial Bursitis of the left shoulder.  POSTOPERATIVE DIAGNOSIS: Same as above,  rotator cuff tendinopathy.   PROCEDURE: The patient was apprised of the risks and the benefits of the procedure and informed consent was obtained, as witnessed by [employer]. Time-out procedure was performed, with confirmation of the patient's name, date of birth, and correct identification  of the left shoulder to be injected. The patient's shoulder was then marked at the appropriate site for injection placement. The shoulder was sterilely prepped with Betadine. A 40 mg (74milliliter) solution of Triamcinolone was drawn up into a 3 mL syringe with 1 mL of 1% lidocaine without epi. The patient was injected with a 22-gauge needle at the posterior aspect of her left shoulder. There were no complications. The patient tolerated the procedure well and had complete resolution of pain following procedure. There was minimal bleeding. The patient was instructed to ice her shoulder upon leaving clinic and refrain from overuse over the next 3 days. The patient was instructed to go to the emergency room with any usual pain, swelling, or redness occurred in the injected area. The patient was given a followup appointment to evaluate response to the injection to his increased range of motion and reduction of pain.   The procedure was supervised by attending physician, Dr. Evette Doffing.

## 2016-09-15 ENCOUNTER — Ambulatory Visit: Payer: Medicare Other | Admitting: Physical Therapy

## 2016-11-01 ENCOUNTER — Encounter: Payer: Self-pay | Admitting: Internal Medicine

## 2016-12-12 DIAGNOSIS — Z1211 Encounter for screening for malignant neoplasm of colon: Secondary | ICD-10-CM | POA: Diagnosis not present

## 2016-12-12 DIAGNOSIS — E782 Mixed hyperlipidemia: Secondary | ICD-10-CM | POA: Diagnosis not present

## 2016-12-12 DIAGNOSIS — K219 Gastro-esophageal reflux disease without esophagitis: Secondary | ICD-10-CM | POA: Diagnosis not present

## 2016-12-13 ENCOUNTER — Ambulatory Visit (INDEPENDENT_AMBULATORY_CARE_PROVIDER_SITE_OTHER): Payer: Medicare Other | Admitting: Internal Medicine

## 2016-12-13 ENCOUNTER — Encounter (INDEPENDENT_AMBULATORY_CARE_PROVIDER_SITE_OTHER): Payer: Self-pay

## 2016-12-13 DIAGNOSIS — Z79899 Other long term (current) drug therapy: Secondary | ICD-10-CM

## 2016-12-13 DIAGNOSIS — M5416 Radiculopathy, lumbar region: Secondary | ICD-10-CM

## 2016-12-13 DIAGNOSIS — M67912 Unspecified disorder of synovium and tendon, left shoulder: Secondary | ICD-10-CM

## 2016-12-13 DIAGNOSIS — J309 Allergic rhinitis, unspecified: Secondary | ICD-10-CM

## 2016-12-13 DIAGNOSIS — K219 Gastro-esophageal reflux disease without esophagitis: Secondary | ICD-10-CM

## 2016-12-13 DIAGNOSIS — Z87891 Personal history of nicotine dependence: Secondary | ICD-10-CM

## 2016-12-13 DIAGNOSIS — G8929 Other chronic pain: Secondary | ICD-10-CM | POA: Diagnosis not present

## 2016-12-13 DIAGNOSIS — M7592 Shoulder lesion, unspecified, left shoulder: Secondary | ICD-10-CM | POA: Diagnosis not present

## 2016-12-13 MED ORDER — TRAMADOL HCL 50 MG PO TABS: 50.0000 mg | ORAL_TABLET | Freq: Two times a day (BID) | ORAL | 0 refills | Status: DC | PRN

## 2016-12-13 MED ORDER — CETIRIZINE HCL 10 MG PO TABS: 10.0000 mg | ORAL_TABLET | Freq: Every day | ORAL | 2 refills | Status: DC

## 2016-12-13 MED ORDER — OMEPRAZOLE 20 MG PO CPDR: 20.0000 mg | DELAYED_RELEASE_CAPSULE | Freq: Two times a day (BID) | ORAL | 3 refills | Status: DC

## 2016-12-13 MED ORDER — FLUTICASONE PROPIONATE 50 MCG/ACT NA SUSP: 2.0000 | Freq: Every day | NASAL | 2 refills | Status: DC

## 2016-12-13 NOTE — Assessment & Plan Note (Signed)
Patient is complaining of hoarse voice and "congestion" in her chest. Patient believes that her hoarse voice is secondary to overuse. She is currently living with and taking care of her mother who is hard of hearing. She reports having to speak loudly or yell in order for her mother to understand her. She denies sore throat. She is also endorsing symptoms of allergic rhinitis that have recently flared with the weather change. She is complaining of cough and nasal congestion. She is not currently taking any allergy medications. We discussed starting a daily antihistamine and intranasal steroid spray to see if this helps with her symptoms. She is agreeable to this plan. -- Start zyrtec 10 mg QHS -- Flonase 2 sprays each nostril daily -- F/u PCP

## 2016-12-13 NOTE — Patient Instructions (Addendum)
Ms. Ehlert,  It was a pleasure to see you today. I am glad you are doing well! I have sent refills of your tramadol and prilosec to your pharmacy. Please do not mix your tramadol with the pain medicine your dentist gave you. For your allergies and hoarse voice, please take zyrtec every night before bed. Please use Flonase, 2 sprays each nostril a day. Please follow up with your primary care doctor in 3 months. If you have any questions or concerns, call our clinic at [phone] or after hours call [phone] and ask for the internal medicine resident on call. Thank you!  - Dr. Philipp Ovens

## 2016-12-13 NOTE — Progress Notes (Signed)
   CC: Chronic pain follow up   HPI:  [name] is a 61 y.o. female with past medical history outlined below here for follow up of her chronic leg pain. For the details of today's visit, please refer to the assessment and plan.  Past Medical History:  Diagnosis Date  . Back pain 07/07/2009  . Bradycardia 05/15/2006  . Bursitis of right shoulder 05/10/2010  . Depression 05/15/2006  . Ecchymoses, spontaneous 08/12/2010  . Fatigue 08/12/2010  . GERD (gastroesophageal reflux disease) 05/15/2006  . Inguinal pain 03/23/2010   right  . Mole (skin) 07/07/2009  . Personal history of goiter 05/15/2006  . Shoulder pain, right 07/07/2009  . Smoker 05/10/2010  . Weight gain 08/12/2010    Review of Systems:  All pertinents listed in HPI, otherwise negative  Physical Exam:  Vitals:   12/13/16 1412  BP: 110/67  Pulse: 61  Temp: 98 F (36.7 C)  TempSrc: Oral  SpO2: 100%  Weight: 189 lb 14.4 oz (86.1 kg)  Height: 5\' 7"  (1.702 m)    Constitutional: NAD, appears comfortable HEENT: Left cheek is swollen, left bottom molar space is packed with cotton balls s/p tooth extraction, no evidence of bleeding  Cardiovascular: RRR, no murmurs, rubs, or gallops.  Pulmonary/Chest: CTAB, no wheezes, rales, or rhonchi.  Abdominal: Soft, non tender, non distended. +BS.  Extremities: Warm and well perfused. No edema.  Neurological: A&Ox3, CN II - XII grossly intact.   Assessment & Plan:   See Encounters Tab for problem based charting.  Patient discussed with Dr. Daryll Drown

## 2016-12-13 NOTE — Assessment & Plan Note (Signed)
Patient has chronic back pain and leg pain that is well controlled on Tylenol, gabapentin 300 mg tid, and prn tramadol. Vitamin D levels were checked at a prior visit and found to be low. She reports compliance with her vitamin D supplementation. She is requesting a refill of her tramadol today. [place] database was checked and her refill history is appropriate. Last filled in January 2018. She takes the tramadol only when her pain is uncontrolled with gabapentin or Tylenol. Of note, patient had a tooth extraction performed today. She was given a prescription for pain medication by her dentist. She does not know the name of the medicine and she has not yet filled the prescription. I instructed her not to mix her tramadol with whatever pain medication was given to her by her dentist. She expressed understanding. -- Refilled tramadol BID prn (#60 tabs)  -- Continue Gabapentin 300 mg TID -- Continue Tylenol PRN  -- F/u PCP 3 months

## 2016-12-13 NOTE — Assessment & Plan Note (Signed)
Refilled Prilosec 20 mg twice a day.

## 2016-12-13 NOTE — Assessment & Plan Note (Signed)
Patient reports complete resolution of her shoulder pain since her left subacromial bursa steroid injection performed in February 2018.  -- Continue to monitor -- Repeat steroid injections as needed

## 2016-12-14 NOTE — Progress Notes (Signed)
Internal Medicine Clinic Attending  Case discussed with Dr. Guilloud at the time of the visit.  We reviewed the resident's history and exam and pertinent patient test results.  I agree with the assessment, diagnosis, and plan of care documented in the resident's note.  

## 2016-12-20 DIAGNOSIS — K635 Polyp of colon: Secondary | ICD-10-CM | POA: Diagnosis not present

## 2016-12-20 DIAGNOSIS — K5730 Diverticulosis of large intestine without perforation or abscess without bleeding: Secondary | ICD-10-CM | POA: Diagnosis not present

## 2016-12-20 DIAGNOSIS — D120 Benign neoplasm of cecum: Secondary | ICD-10-CM | POA: Diagnosis not present

## 2016-12-20 DIAGNOSIS — D122 Benign neoplasm of ascending colon: Secondary | ICD-10-CM | POA: Diagnosis not present

## 2016-12-20 DIAGNOSIS — Z1211 Encounter for screening for malignant neoplasm of colon: Secondary | ICD-10-CM | POA: Diagnosis not present

## 2017-02-01 DIAGNOSIS — H40023 Open angle with borderline findings, high risk, bilateral: Secondary | ICD-10-CM | POA: Diagnosis not present

## 2017-02-01 DIAGNOSIS — H2513 Age-related nuclear cataract, bilateral: Secondary | ICD-10-CM | POA: Diagnosis not present

## 2017-02-01 DIAGNOSIS — H18893 Other specified disorders of cornea, bilateral: Secondary | ICD-10-CM | POA: Diagnosis not present

## 2017-02-01 DIAGNOSIS — H40013 Open angle with borderline findings, low risk, bilateral: Secondary | ICD-10-CM | POA: Diagnosis not present

## 2017-02-01 DIAGNOSIS — H25043 Posterior subcapsular polar age-related cataract, bilateral: Secondary | ICD-10-CM | POA: Diagnosis not present

## 2017-03-14 ENCOUNTER — Encounter: Payer: Self-pay | Admitting: Internal Medicine

## 2017-03-14 ENCOUNTER — Telehealth: Payer: Self-pay | Admitting: Internal Medicine

## 2017-03-14 ENCOUNTER — Ambulatory Visit (INDEPENDENT_AMBULATORY_CARE_PROVIDER_SITE_OTHER): Payer: Medicare Other | Admitting: Internal Medicine

## 2017-03-14 VITALS — BP 141/78 | HR 48 | Temp 98.0°F | Ht 67.0 in | Wt 193.6 lb

## 2017-03-14 DIAGNOSIS — E559 Vitamin D deficiency, unspecified: Secondary | ICD-10-CM

## 2017-03-14 DIAGNOSIS — M5417 Radiculopathy, lumbosacral region: Secondary | ICD-10-CM

## 2017-03-14 DIAGNOSIS — M5416 Radiculopathy, lumbar region: Secondary | ICD-10-CM

## 2017-03-14 DIAGNOSIS — Z0000 Encounter for general adult medical examination without abnormal findings: Secondary | ICD-10-CM

## 2017-03-14 MED ORDER — TRAMADOL HCL 50 MG PO TABS: 50.0000 mg | ORAL_TABLET | Freq: Three times a day (TID) | ORAL | 0 refills | Status: DC | PRN

## 2017-03-14 MED ORDER — GABAPENTIN 300 MG PO CAPS: 600.0000 mg | ORAL_CAPSULE | Freq: Two times a day (BID) | ORAL | 3 refills | Status: DC

## 2017-03-14 NOTE — Telephone Encounter (Signed)
Please call back to Clarify scripts on pt's gabapentin medications per [facility]

## 2017-03-14 NOTE — Assessment & Plan Note (Addendum)
Noted MRI results from 2013. She complains of increased pain medication requirement over the past few months. She has been trying several non-narcotic medications prior to taking Tramadol however finds best improvement in her functional status with Tramadol.  Notes she is unable to stand or walk for long periods without terrible pain. Improved with sitting. Notes numbness/tingling/feeling like her legs are asleep (R>L) occasionally as well. Still has control of bowel and bladder function however believe patient would benefit from being seen by a specialist.  -Refer to neurosurgery -Increase Gabapentin to 600mg  BID -Refill Tramadol 50mg  TID PRN severe pain, #90, Refill 0. This should last 3-5 months.    *Reviewed [place] controlled substance database as noted in HPI.

## 2017-03-14 NOTE — Assessment & Plan Note (Signed)
Still taking Vitamin D 1000 units daily. OTC, no refill needed.

## 2017-03-14 NOTE — Patient Instructions (Addendum)
It was a pleasure seeing you today. I'm sorry you are having worse pain. I'm glad you are trying alternative medicines like Tylenol before taking your Tramadol.   I have increased your Gabapentin to 600mg  twice daily.   I have also sent you a refill for Tramadol however with additional pills to help cover your higher pain right now.   I have referred you to neurosurgery to get their opinion on your spine. Please be sure to go to this appointment!  See me back in 6 months!

## 2017-03-14 NOTE — Progress Notes (Signed)
   CC: follow-up of low back pain  HPI:  [name] is a 61 y.o. F with Mhx as outlined below who presents for follow-up of her lumbar radiculopathy. She complains of chronic LBP with numbness and tingling down R>L leg. MRI from MRI 2013 with several bulging discs, advanced arthropathy and compression of L4, L5, S1 nerve roots. She has had no falls related to this and still has control of her bowel and bladder function.   She notes she has been requiring more Tramadol than she previously had. Has been on this since 2012. Could usually make 60 pills last 3 months. She's been taking Tylenol, gabapentin and ibuprofen prior to taking her prn tramadol. No trauma. No other changes. Wonders if she might benefit from being seen by neurosurgery. Has son with similar issues.   Reviewed [place] controlled substance database: Appropriate. Both Tramadol and Hydrocodone filled 11/2016. Noted when given Rx for tramadol that she had been given rx by her dentist but didn't remember name.   Past Medical History:  Diagnosis Date  . Back pain 07/07/2009  . Bradycardia 05/15/2006  . Bursitis of right shoulder 05/10/2010  . Depression 05/15/2006  . Ecchymoses, spontaneous 08/12/2010  . Fatigue 08/12/2010  . GERD (gastroesophageal reflux disease) 05/15/2006  . Inguinal pain 03/23/2010   right  . Mole (skin) 07/07/2009  . Personal history of goiter 05/15/2006  . Shoulder pain, right 07/07/2009  . Smoker 05/10/2010  . Weight gain 08/12/2010   Review of Systems:   General: Denies fevers, chills, weight loss, fatigue Cardiac: Denies CP, SOB, palpitations Pulmonary: Denies cough, wheezes, PND Abd: Denies diarrhea, constipation, changes in bowels Extremities: +numbness. Denies weakness or swelling  Physical Exam: General: Alert, in no acute distress. Pleasant and conversant HEENT: No icterus, injection or ptosis. No hoarseness or dysarthria  Cardiac: RRR, no MGR appreciated Pulmonary: CTA BL with normal  WOB on RA. Able to speak in complete sentences Abd: Soft, non-tended. +bs Extremities: Warm, perfused. No significant pedal edema. Strength 5/5 BL LE. Gait normal.  Vitals:   03/14/17 1325  BP: (!) 141/78  Pulse: (!) 48  Temp: 98 F (36.7 C)  TempSrc: Oral  SpO2: 100%  Weight: 193 lb 9.6 oz (87.8 kg)  Height: 5\' 7"  (1.702 m)   BP 138/72 on repeat  Assessment & Plan:   See Encounters Tab for problem based charting.  Patient discussed with Dr. Lynnae January

## 2017-03-14 NOTE — Assessment & Plan Note (Signed)
Again discussed importance of routine mammo screen and also offered HepC and HIV screens. States will get mammo, deferred lab.

## 2017-03-16 NOTE — Telephone Encounter (Signed)
Called [facility], spoke to pharmacist went by visit note for gabapentin- 600mg  twice daily, instructed to take 2 caps in am and 2 caps in pm #120, do you agree?

## 2017-03-17 NOTE — Telephone Encounter (Signed)
I agree! Gabapentin 600mg  BID

## 2017-03-17 NOTE — Progress Notes (Signed)
Internal Medicine Clinic Attending  Case discussed with Dr. Molt at the time of the visit.  We reviewed the resident's history and exam and pertinent patient test results.  I agree with the assessment, diagnosis, and plan of care documented in the resident's note. 

## 2017-03-29 ENCOUNTER — Ambulatory Visit (INDEPENDENT_AMBULATORY_CARE_PROVIDER_SITE_OTHER): Payer: Medicare Other | Admitting: *Deleted

## 2017-03-29 DIAGNOSIS — Z23 Encounter for immunization: Secondary | ICD-10-CM | POA: Diagnosis not present

## 2017-04-20 ENCOUNTER — Encounter: Payer: Self-pay | Admitting: Internal Medicine

## 2017-05-17 DIAGNOSIS — R030 Elevated blood-pressure reading, without diagnosis of hypertension: Secondary | ICD-10-CM | POA: Diagnosis not present

## 2017-05-17 DIAGNOSIS — I739 Peripheral vascular disease, unspecified: Secondary | ICD-10-CM | POA: Diagnosis not present

## 2017-05-23 DIAGNOSIS — M5126 Other intervertebral disc displacement, lumbar region: Secondary | ICD-10-CM | POA: Diagnosis not present

## 2017-05-23 DIAGNOSIS — I739 Peripheral vascular disease, unspecified: Secondary | ICD-10-CM | POA: Diagnosis not present

## 2017-05-24 DIAGNOSIS — M4696 Unspecified inflammatory spondylopathy, lumbar region: Secondary | ICD-10-CM | POA: Diagnosis not present

## 2017-05-24 DIAGNOSIS — M5127 Other intervertebral disc displacement, lumbosacral region: Secondary | ICD-10-CM | POA: Diagnosis not present

## 2017-08-17 ENCOUNTER — Other Ambulatory Visit: Payer: Self-pay | Admitting: Internal Medicine

## 2017-08-17 DIAGNOSIS — E785 Hyperlipidemia, unspecified: Secondary | ICD-10-CM

## 2017-08-30 ENCOUNTER — Encounter: Payer: Self-pay | Admitting: Internal Medicine

## 2017-08-30 ENCOUNTER — Ambulatory Visit (INDEPENDENT_AMBULATORY_CARE_PROVIDER_SITE_OTHER): Payer: Medicare Other | Admitting: Internal Medicine

## 2017-08-30 DIAGNOSIS — R490 Dysphonia: Secondary | ICD-10-CM | POA: Diagnosis not present

## 2017-08-30 MED ORDER — LORATADINE 10 MG PO TABS: 10.0000 mg | ORAL_TABLET | Freq: Every day | ORAL | 0 refills | Status: DC

## 2017-08-30 NOTE — Assessment & Plan Note (Signed)
Patient presents with a one-month history of hoarseness.  She states that she has accompanied postnasal drip she has often clear her throat, itching of her eyes, acid reflux.  She denies any shortness of breath, chest pain, headaches, nausea/vomiting, fevers, sneezing, dysphagia.  She has an established diagnosis of GERD for which she uses omeprazole.  However the patient has not been taking omeprazole appropriately.  She was instructed to take omeprazole 30 minutes prior to every meal.  The patient's symptoms are also consistent with seasonal allergies for which she is already taking Zyrtec and Flonase.  Although Flonase has been ordered to causing hoarseness it is a common side effect.  Will change Zyrtec to loratadine to note if there is any effect.  Patient has not had any recent changes in environment, exposure to any new substances that exacerbate her allergies.  The patient is a previous smoker when asked about her smoking history she states that she only takes a few puffs of her daughter cigarette.  -Instructed patient to return in 3 weeks and if she continues to have symptoms will refer to ENT to further evaluate. -Also the patient to stop smoking and drinking alcohol as it can worsen her hoarseness

## 2017-08-30 NOTE — Progress Notes (Signed)
Internal Medicine Clinic Attending  Case discussed with Dr. Maricela Bo at the time of the visit.  We reviewed the resident's history and exam and pertinent patient test results.  I agree with the assessment, diagnosis, and plan of care documented in the resident's note. Allergies could certainly cause sxs complex but she is on nasal steroid and antihistamine. If no improvement with med changes, would need to consider vocal cord abnl / head neck cancer as cause of her hoarseness.

## 2017-08-30 NOTE — Patient Instructions (Addendum)
It was a pleasure to see you today [name]. Please make the following changes:  -Please continue to take omeprazole 49min prior to meal -Please continue taking flonase  -  If you have any questions or concerns, please call our clinic at [phone] between 9am-5pm and after hours call [phone] and ask for the internal medicine resident on call. If you feel you are having a medical emergency please call 911.   Thank you, we look forward to help you remain healthy!  Lars Mage, MD Internal Medicine PGY1  FOLLOW-UP INSTRUCTIONS When: 3 weeks 09/19/17 with Dr. Danford Bad For: Hoarseness follow up What to bring: nothing   Hoarseness Hoarseness is any abnormal change in your voice.Hoarseness can make it difficult to speak. Your voice may sound raspy, breathy, or strained. Hoarseness is caused by a problem with the vocal cords. The vocal cords are two bands of tissue inside your voice box (larynx). When you speak, your vocal cords move back and forth to create sound. The surfaces of your vocal cords need to be smooth for your voice to sound clear. Swelling or lumps on the vocal cords can cause hoarseness. Common causes of vocal cord problems include:  Upper airway infection.  A long-term cough.  Straining or overusing your voice.  Smoking.  Allergies.  Vocal cord growths.  Stomach acids that flow up from your stomach and irritate your vocal cords (gastroesophageal reflux).  Follow these instructions at home: Watch your condition for any changes. To ease any discomfort that you feel:  Rest your voice. Do not whisper. Whispering can cause muscle strain.  Do not speak in a loud or harsh voice that makes your hoarseness worse.  Do not use any tobacco products, including cigarettes, chewing tobacco, or electronic cigarettes. If you need help quitting, ask your health care provider.  Avoid secondhand smoke.  Do not eat foods that give you heartburn. Heartburn can make  gastroesophageal reflux worse.  Do not drink coffee.  Do not drink alcohol.  Drink enough fluids to keep your urine clear or pale yellow.  Use a humidifier if the air in your home is dry.  Contact a health care provider if:  You have hoarseness that lasts longer than 3 weeks.  You almost lose or completelylose your voice for longer than 3 days.  You have pain when you swallow or try to talk.  You feel a lump in your neck. Get help right away if:  You have trouble swallowing.  You feel as though you are choking when you swallow.  You cough up blood or vomit blood.  You have trouble breathing. This information is not intended to replace advice given to you by your health care provider. Make sure you discuss any questions you have with your health care provider. Document Released: 06/24/2005 Document Revised: 12/17/2015 Document Reviewed: 07/02/2014 Elsevier Interactive Patient Education  [employer].

## 2017-08-30 NOTE — Progress Notes (Signed)
   CC: Hoarseness  HPI:  [name] is a 62 y.o. with gerd, bradycardia, back pain who presents to be seen for hoarseness. Please see problem based charting for evaluation, assessment, and plan.  Past Medical History:  Diagnosis Date  . Back pain 07/07/2009  . Bradycardia 05/15/2006  . Bursitis of right shoulder 05/10/2010  . Depression 05/15/2006  . Ecchymoses, spontaneous 08/12/2010  . Fatigue 08/12/2010  . GERD (gastroesophageal reflux disease) 05/15/2006  . Inguinal pain 03/23/2010   right  . Mole (skin) 07/07/2009  . Personal history of goiter 05/15/2006  . Shoulder pain, right 07/07/2009  . Smoker 05/10/2010  . Weight gain 08/12/2010   Review of Systems: Denies shortness of breath, chest pain, headache, nausea/vomiting, fever, sneezing, dysphagia  Physical Exam:  Vitals:   08/30/17 0955  BP: 120/66  Pulse: (!) 55  SpO2: 97%  Weight: 199 lb 11.2 oz (90.6 kg)    Physical Exam  Constitutional: She appears well-developed and well-nourished. No distress.  HENT:  Head: Normocephalic and atraumatic.  Mouth/Throat: Oropharynx is clear and moist. No oropharyngeal exudate.  No anterior or posterior cervical lymphadenopathy  Eyes: Conjunctivae are normal.  Cardiovascular: Normal rate, regular rhythm and normal heart sounds.  Respiratory: Effort normal and breath sounds normal. No respiratory distress. She has no wheezes.  GI: Soft. Bowel sounds are normal. She exhibits no distension. There is no tenderness.  Musculoskeletal: She exhibits no edema.  Neurological: She is alert.  Skin: She is not diaphoretic. No erythema.  Psychiatric: She has a normal mood and affect. Her behavior is normal. Judgment and thought content normal.   Assessment & Plan:   See Encounters Tab for problem based charting.  Patient discussed with Dr. Lynnae January

## 2017-09-08 ENCOUNTER — Other Ambulatory Visit: Payer: Self-pay | Admitting: Internal Medicine

## 2017-09-08 DIAGNOSIS — M5416 Radiculopathy, lumbar region: Secondary | ICD-10-CM

## 2017-11-03 ENCOUNTER — Ambulatory Visit (INDEPENDENT_AMBULATORY_CARE_PROVIDER_SITE_OTHER): Payer: Medicare Other | Admitting: Internal Medicine

## 2017-11-03 ENCOUNTER — Encounter: Payer: Self-pay | Admitting: Internal Medicine

## 2017-11-03 ENCOUNTER — Other Ambulatory Visit: Payer: Self-pay

## 2017-11-03 VITALS — BP 140/60 | HR 57 | Temp 97.9°F | Ht 67.5 in | Wt 200.6 lb

## 2017-11-03 DIAGNOSIS — Z79891 Long term (current) use of opiate analgesic: Secondary | ICD-10-CM | POA: Diagnosis not present

## 2017-11-03 DIAGNOSIS — R151 Fecal smearing: Secondary | ICD-10-CM | POA: Diagnosis not present

## 2017-11-03 DIAGNOSIS — G8929 Other chronic pain: Secondary | ICD-10-CM

## 2017-11-03 DIAGNOSIS — R030 Elevated blood-pressure reading, without diagnosis of hypertension: Secondary | ICD-10-CM | POA: Diagnosis not present

## 2017-11-03 DIAGNOSIS — R32 Unspecified urinary incontinence: Secondary | ICD-10-CM

## 2017-11-03 DIAGNOSIS — M5116 Intervertebral disc disorders with radiculopathy, lumbar region: Secondary | ICD-10-CM

## 2017-11-03 DIAGNOSIS — M5126 Other intervertebral disc displacement, lumbar region: Secondary | ICD-10-CM | POA: Diagnosis not present

## 2017-11-03 DIAGNOSIS — M5416 Radiculopathy, lumbar region: Secondary | ICD-10-CM

## 2017-11-03 NOTE — Patient Instructions (Signed)
It was great seeing you today.   Ive contacted [facility] and they will see you today as long as you head directly there!  Please go to: [facility]  [place] [address]  [place], [place] [address] Phone: [phone]

## 2017-11-03 NOTE — Assessment & Plan Note (Signed)
Assessment: [name] presents today with ongoing and worsened severe RLE pain. She reports new urinary incontinence, occasional inadvertent passing of stool when passing gas and also new diminished sensation of RLE. Exam with relative weakness of RLE compared to LLE and she noted decreased sensation on exam. She has been having significant difficulty ambulating about her house due to the pain. She was referred to and had an appointment with neurosurgery last fall but seems to have cancelled the appointment and forgot to reschedule. She requests pain medication.   Plan: Discussed case with provider at [facility] about the next best step in management (ie: Admit vs ED vs MRI/close outpatient follow-up) who graciously agreed to see the patient immediately in their office. She was given address and instructions and sent immediately to their office.  -Appreciate their prompt evaluation of the patient and recommendations going forward -Will follow with patient in clinic within the next few weeks, depending on NSGY plans.

## 2017-11-03 NOTE — Progress Notes (Signed)
   CC: follow up lumbar radiculopathy, new urinary incontinence  HPI:  [name] is a 62 y.o. F with long history of chronic LBP with lumbar radiculopathy of R>L LE. MRI 2013 with multiple bulging discs, advanced arthropathy and mass effect/compression of L4, L5 and S1 nerve roots. She takes Tramadol sparingly for her pain and was referred to neurosurgery at our last visit however cancelled and forgot to reschedule this.   She presents today with severe RLE pain and 1 month history of urinary incontinence. Notes she is unable to sense when she needs to urinate and has had several episodes of large-volume urine incontinence. She denied over fecal incontinence but has been passing stool while passing gas without sensing this. She claims to have intact sensation of her genitalia.  For details regarding today's visit and the status of their chronic medical issues, please refer to the assessment and plan.  Past Medical History:  Diagnosis Date  . Back pain 07/07/2009  . Bradycardia 05/15/2006  . Bursitis of right shoulder 05/10/2010  . Depression 05/15/2006  . Ecchymoses, spontaneous 08/12/2010  . Fatigue 08/12/2010  . GERD (gastroesophageal reflux disease) 05/15/2006  . Inguinal pain 03/23/2010   right  . Mole (skin) 07/07/2009  . Personal history of goiter 05/15/2006  . Shoulder pain, right 07/07/2009  . Smoker 05/10/2010  . Weight gain 08/12/2010   Review of Systems:   General: Denies fevers, chills, weight loss, fatigue HEENT: Denies acute changes in vision, sore throat, dysphagia Cardiac: Denies CP, SOB Pulmonary: Denies cough, wheezing Abd: +Urinary incontinence. +fecal smearing. Denies abdominal pain Extremities: +Subjective RLE weakness and numbness. +Severe pain down RLE.  Physical Exam: General: Alert, in no acute distress but embarrassed when discussing current symptoms HEENT: +Voice hoarse. No icterus, injection or ptosis. No dysarthria  Cardiac: RRR, no MGR  appreciated Pulmonary: CTA BL with normal WOB on RA. Able to speak in complete sentences Abd: Soft, non-tender. +bs Extremities: Warm, perfused. No rash Neuro: EOMI. Facial muscles symmetric. Strength and sensation intact BL UE. Diminished sensation to light-touch and 4/5 strength RLE. LLL wnl.  Vitals:   11/03/17 1315  BP: 140/60  Pulse: (!) 57  Temp: 97.9 F (36.6 C)  TempSrc: Oral  SpO2: 98%  Weight: 200 lb 9.6 oz (91 kg)  Height: 5' 7.5" (1.715 m)   Body mass index is 30.95 kg/m.  Assessment & Plan:   See Encounters Tab for problem based charting.  Patient discussed with Dr. Rebeca Alert

## 2017-11-11 NOTE — Progress Notes (Signed)
Internal Medicine Clinic Attending  Case discussed with Dr. Danford Bad  at the time of the visit.  We reviewed the resident's history and exam and pertinent patient test results.  I agree with the assessment, diagnosis, and plan of care documented in the resident's note.  Story is concerning for cord compression or cauda equina syndrome, greatly appreciate prompt neurosurgical evaluation in their office to avoid ED visit.  Oda Kilts, MD

## 2017-12-12 ENCOUNTER — Encounter: Payer: Medicare Other | Admitting: Internal Medicine

## 2018-01-09 DIAGNOSIS — M545 Low back pain: Secondary | ICD-10-CM | POA: Diagnosis not present

## 2018-01-18 ENCOUNTER — Other Ambulatory Visit: Payer: Self-pay | Admitting: Internal Medicine

## 2018-01-18 DIAGNOSIS — K219 Gastro-esophageal reflux disease without esophagitis: Secondary | ICD-10-CM

## 2018-01-29 ENCOUNTER — Encounter: Payer: Self-pay | Admitting: Podiatry

## 2018-02-01 DIAGNOSIS — M4316 Spondylolisthesis, lumbar region: Secondary | ICD-10-CM | POA: Diagnosis not present

## 2018-02-01 DIAGNOSIS — M4696 Unspecified inflammatory spondylopathy, lumbar region: Secondary | ICD-10-CM | POA: Diagnosis not present

## 2018-02-13 ENCOUNTER — Encounter: Payer: Medicare Other | Admitting: Internal Medicine

## 2018-02-14 NOTE — Progress Notes (Signed)
Erroneous encounter, please disregard

## 2018-02-20 ENCOUNTER — Other Ambulatory Visit: Payer: Self-pay

## 2018-02-20 ENCOUNTER — Ambulatory Visit: Payer: Medicare Other | Attending: Neurosurgery | Admitting: Physical Therapy

## 2018-02-20 ENCOUNTER — Encounter: Payer: Self-pay | Admitting: Physical Therapy

## 2018-02-20 DIAGNOSIS — M545 Low back pain: Secondary | ICD-10-CM | POA: Insufficient documentation

## 2018-02-20 DIAGNOSIS — R262 Difficulty in walking, not elsewhere classified: Secondary | ICD-10-CM

## 2018-02-20 DIAGNOSIS — G8929 Other chronic pain: Secondary | ICD-10-CM

## 2018-02-20 NOTE — Therapy (Signed)
[place], [place], [address] Phone: [phone]   Fax:  [phone]  Physical Therapy Evaluation  Patient Details  Name: [name] MRN: [id number] Date of Birth: Mar 18, 1956 Referring Provider: Jairo Ben, MD   Encounter Date: 02/20/2018  PT End of Session - 02/20/18 1102    Visit Number  1    Number of Visits  13    Date for PT Re-Evaluation  04/06/18    Authorization Type  [employer] MCR  PN at visit 10, KX at visit 15    PT Start Time  1100    PT Stop Time  1143    PT Time Calculation (min)  43 min    Activity Tolerance  Patient tolerated treatment well    Behavior During Therapy  [facility] for tasks assessed/performed       Past Medical History:  Diagnosis Date  . Back pain 07/07/2009  . Bradycardia 05/15/2006  . Bursitis of right shoulder 05/10/2010  . Depression 05/15/2006  . Ecchymoses, spontaneous 08/12/2010  . Fatigue 08/12/2010  . GERD (gastroesophageal reflux disease) 05/15/2006  . Inguinal pain 03/23/2010   right  . Mole (skin) 07/07/2009  . Personal history of goiter 05/15/2006  . Shoulder pain, right 07/07/2009  . Smoker 05/10/2010  . Weight gain 08/12/2010    Past Surgical History:  Procedure Laterality Date  . ABDOMINAL HYSTERECTOMY      There were no vitals filed for this visit.   Subjective Assessment - 02/20/18 1106    Subjective  Cares for her 7 year old Mom who was recently diagnosed with stomach CA- feels that stress is not helping. Noticed recently her right arm becomes numb below elboow that lasts for several hours at a time. 2-3 years ago back pain increased to the point where she was seeking care. Legs become tight and numb, Right leg becomes numb similar to arm. Has noticed loss of bladder/bowel control in the past- a couple of months ago. Reports neurosurgeon did not discuss possibility of surgical intervention but is going to begin having injections. Has  recently gained 40-50 lb over the last 2 years. Reports yesterday Lt knee began popping and was very painful. Is gong to call today for home health aid to help with Mom    How long can you stand comfortably?  5 min    How long can you walk comfortably?  5 min    Patient Stated Goals  decrease pain, walking, complete chores without rest breaks, lose weight    Currently in Pain?  Yes    Pain Score  8     Pain Location  Leg    Pain Orientation  Right    Pain Descriptors / Indicators  Squeezing    Aggravating Factors   laying flat, being upright    Multiple Pain Sites  Yes    Pain Score  7    Pain Location  Back    Pain Orientation  Left    Pain Descriptors / Indicators  -- it's too heavy    Aggravating Factors   being upright, laying supine    Pain Relieving Factors  lean back         [facility] PT Assessment - 02/20/18 0001      Assessment   Medical Diagnosis  L4-5 spondylolisthesis    Referring Provider  Jairo Ben, MD    Onset Date/Surgical Date  -- 10-15 yr of pain, worsened for treatment 2-3 years  ago    Hand Dominance  Right    Next MD Visit  -- not scheduled    Prior Therapy  no      Precautions   Precaution Comments  h/o loss of bladder control      Balance Screen   Has the patient fallen in the past 6 months  No      [place] residence    Living Arrangements  Parent      Prior Function   Level of Independence  Independent    Vocation  On disability      Cognition   Overall Cognitive Status  Within Functional Limits for tasks assessed      ROM / Strength   AROM / PROM / Strength  Strength      Strength   Overall Strength Comments  able to demonstrate gross 3/5 strength in LE, inappropriate to test due to known mobile spondylolisthesis and past loss of bladder control'      Palpation   Palpation comment  tender along Rt LE                Objective measurements completed on examination: See above  findings.      [facility] PT Treatment/Exercise - 02/20/18 0001      Exercises   Exercises  Lumbar      Lumbar Exercises: Stretches   Passive Hamstring Stretch Limitations  seated in chair      Lumbar Exercises: Seated   Other Seated Lumbar Exercises  forward bend             PT Education - 02/20/18 2106    Education Details  anatomy of condition, POC, HEP, exercise form/rationale, stress and pain, symptoms of heart attack    Person(s) Educated  Patient    Methods  Explanation;Demonstration;Tactile cues;Verbal cues;Handout    Comprehension  Verbalized understanding;Returned demonstration;Verbal cues required;Tactile cues required;Need further instruction          PT Long Term Goals - 02/20/18 2112      PT LONG TERM GOAL #1   Title  Pt will be able to provide aid to Mom at home as needed without limitation by back pain    Baseline  unable at eval    Time  6    Period  Weeks    Status  New    Target Date  04/06/18      PT LONG TERM GOAL #2   Title  Average pain <=4/10 to decrease limitaion on daily activities    Baseline  avg 8/10 at eval    Time  6    Period  Weeks    Status  New    Target Date  04/06/18      PT LONG TERM GOAL #3   Title  Pt will be able to ambulate for at least 30 min comfortably for improved community ambulation    Baseline  5 min at eval    Time  6    Period  Weeks    Status  New    Target Date  04/06/18      PT LONG TERM GOAL #4   Title  Pt will be able to complete chores around her home with 2 or fewer rest breaks    Baseline  multiple at eval    Time  6    Period  Weeks    Status  New    Target Date  04/06/18  PT LONG TERM GOAL #5   Title  Pt will be independent in long term HEP for continued care and progress weight loss journey    Baseline  will establish and progress as appropriate    Time  6    Period  Weeks    Status  New    Target Date  04/06/18             Plan - 02/20/18 1143    Clinical Impression  Statement  Pt had to be woken up on multiple occasions today- reports she takes a lot of medication and it makes her sleepy. Pt presents to PT with complaints of LBP and RLE pain that has been present 10-15 years but worsened significantly in last 2-3 years. Pt is under significant stress and we discussed options for aid with Mom and importance of stress relief for pain control. I asked pt to utilize a knee bolster in supine and avoid using a cane for pressure relief of leg due to strain that would be placed on back. Pt c/o Rt lower arm pain with fear of a hear attack and was educated on symptoms, she was also advised to visit ER immediately should loss of bladder control occur again. Due to high pain levels and noted mobile spondylolisthesis, MMT was not appropriate to perform at this time. Pt will benefit from skilled PT in order to improve lumbopelvic strength and stability to provide support to spondylolisthesis.     History and Personal Factors relevant to plan of care:  depression, stress, h/o loss of bladder control, recent weight gain    Clinical Presentation  Evolving    Clinical Presentation due to:  worsening pain with unstable radicular symptoms    Rehab Potential  Good    PT Frequency  2x / week    PT Duration  6 weeks    PT Treatment/Interventions  ADLs/Self Care Home Management;Cryotherapy;Electrical Stimulation;Iontophoresis 4mg /ml Dexamethasone;Moist Heat;Therapeutic exercise;Therapeutic activities;Functional mobility training;Gait training;Stair training;Ultrasound;Balance training;Neuromuscular re-education;Patient/family education;Manual techniques;Taping;Dry needling;Passive range of motion    PT Next Visit Plan  core/glut activation    PT Home Exercise Plan  seated HSS, seated pririformis stretch, seated fwd bend, hooklying SKTC    Consulted and Agree with Plan of Care  Patient       Patient will benefit from skilled therapeutic intervention in order to improve the following  deficits and impairments:  Decreased endurance, Decreased activity tolerance, Decreased strength, Pain, Increased muscle spasms, Difficulty walking, Decreased mobility, Decreased balance, Improper body mechanics, Postural dysfunction, Impaired flexibility, Hypermobility  Visit Diagnosis: Chronic bilateral low back pain, with sciatica presence unspecified - Plan: PT plan of care cert/re-cert  Difficulty in walking, not elsewhere classified - Plan: PT plan of care cert/re-cert     Problem List Patient Active Problem List   Diagnosis Date Noted  . Hoarseness, persistent 08/30/2017  . Allergic rhinitis 12/13/2016  . Tendinopathy of left rotator cuff 09/09/2016  . Vitamin D deficiency 08/29/2016  . Screening for breast cancer 02/16/2016  . Acute foot pain 02/16/2016  . HLD (hyperlipidemia) 07/14/2015  . Tobacco abuse 11/11/2013  . Bilateral knee pain 05/29/2012  . Healthcare maintenance 05/29/2012  . Depression 07/15/2011  . Lumbar back pain with radiculopathy affecting right lower extremity 03/21/2011  . Bradycardia 11/08/2010  . GERD 05/15/2006   Hussain Maimone C. Araceli Arango PT, DPT 02/20/18 9:19 PM   [facility] [address] [place], [place], [address] Phone: [phone]   Fax:  [phone]  Name:  [name] MRN: [id number] Date of Birth: [birth year]

## 2018-02-26 ENCOUNTER — Encounter: Payer: Self-pay | Admitting: Internal Medicine

## 2018-02-26 ENCOUNTER — Ambulatory Visit (INDEPENDENT_AMBULATORY_CARE_PROVIDER_SITE_OTHER): Payer: Medicare Other | Admitting: Internal Medicine

## 2018-02-26 ENCOUNTER — Other Ambulatory Visit: Payer: Self-pay

## 2018-02-26 VITALS — BP 115/61 | HR 58 | Temp 98.3°F | Ht 67.5 in | Wt 192.8 lb

## 2018-02-26 DIAGNOSIS — M545 Low back pain: Secondary | ICD-10-CM

## 2018-02-26 DIAGNOSIS — G8929 Other chronic pain: Secondary | ICD-10-CM

## 2018-02-26 DIAGNOSIS — N289 Disorder of kidney and ureter, unspecified: Secondary | ICD-10-CM

## 2018-02-26 DIAGNOSIS — Z0000 Encounter for general adult medical examination without abnormal findings: Secondary | ICD-10-CM | POA: Diagnosis not present

## 2018-02-26 DIAGNOSIS — K219 Gastro-esophageal reflux disease without esophagitis: Secondary | ICD-10-CM

## 2018-02-26 DIAGNOSIS — R200 Anesthesia of skin: Secondary | ICD-10-CM

## 2018-02-26 DIAGNOSIS — M542 Cervicalgia: Secondary | ICD-10-CM

## 2018-02-26 DIAGNOSIS — Z79899 Other long term (current) drug therapy: Secondary | ICD-10-CM

## 2018-02-26 DIAGNOSIS — M5416 Radiculopathy, lumbar region: Secondary | ICD-10-CM

## 2018-02-26 DIAGNOSIS — R202 Paresthesia of skin: Secondary | ICD-10-CM

## 2018-02-26 DIAGNOSIS — Z791 Long term (current) use of non-steroidal anti-inflammatories (NSAID): Secondary | ICD-10-CM

## 2018-02-26 DIAGNOSIS — E782 Mixed hyperlipidemia: Secondary | ICD-10-CM | POA: Diagnosis not present

## 2018-02-26 HISTORY — DX: Anesthesia of skin: R20.0

## 2018-02-26 MED ORDER — GABAPENTIN 300 MG PO CAPS: 600.0000 mg | ORAL_CAPSULE | Freq: Three times a day (TID) | ORAL | 3 refills | Status: DC

## 2018-02-26 NOTE — Assessment & Plan Note (Signed)
Assessment:  She continues to complain of right forearm and hand numbness/tingling and now feels like she is having some decreased grip strength as well. Exam with possible thenar atrophy. She does have chronic neck pain but no trauma and no imaging of her C-spine has been done as far as I can see.      Plan: Increasing gabapentin. Pt already follows with neurosurgery; have asked her to discuss this with them at their appointment this week. She may benefit from imaging of c-spine however will not order this today as her neurosurgeon is not with our EMR and they will need to review these images.

## 2018-02-26 NOTE — Assessment & Plan Note (Addendum)
Assessment:  Follows with neurosurgery, has an appointment later this week for follow-up. She reports the Gabapentin helps but wears off before her second dose is due. She has no changes in weakness or pain.      Plan: Will increase Gabapentin to 600mg  TID. She has follow-up this week with her neurosurgeon and I've asked her to discuss her RUE numbness and reduced grip strength as she may require additional imaging.  -Checking renal function today given recent Rx from neurosurgery for NSAIDs

## 2018-02-26 NOTE — Progress Notes (Signed)
   CC: follow-up of lumbar radiculopathy, gerd  HPI:  [name] is a 62 y.o. F with medical history as noted below who presents today for follow-up of lumbar radiculopathy and right arm numbness. She also reports increased depression due to mothers terminal cancer diagnosis last week. Otherwise, she has no complaints.   For details regarding today's visit and the status of their chronic medical issues, please refer to the assessment and plan.   Past Medical History:  Diagnosis Date  . Back pain 07/07/2009  . Bradycardia 05/15/2006  . Bursitis of right shoulder 05/10/2010  . Depression 05/15/2006  . Ecchymoses, spontaneous 08/12/2010  . Fatigue 08/12/2010  . GERD (gastroesophageal reflux disease) 05/15/2006  . Inguinal pain 03/23/2010   right  . Mole (skin) 07/07/2009  . Personal history of goiter 05/15/2006  . Shoulder pain, right 07/07/2009  . Smoker 05/10/2010  . Weight gain 08/12/2010   Review of Systems:   General: Admits to fatigue. Denies fevers, chills, weight loss HEENT: Denies changes in vision, sore throat Cardiac: Denies CP, SOB Pulmonary: Denies cough, wheezes Abd: Denies abdominal pain, changes in bowels Extremities: Admits to intermittent pain of BL LE and right arm (tingling). Admits to decreased hand strength of right hand.   Physical Exam: General: Alert, in no acute distress. Pleasant and conversant but tearful when discussing mother and her frustration with RUE numbness.  HEENT: No icterus, injection or ptosis. No hoarseness or dysarthria  Cardiac: Regular rate and rhythm without murmur Pulmonary: CTA BL with normal WOB on RA. Able to speak in complete sentences Abd: Soft, non-tender. +bs Extremities: Warm, perfused. No significant pedal edema. BL UE muscle bulk symmetric except for possible right hand thenar wasting. Had good grip strength on exam.   Vitals:   02/26/18 1307  BP: 115/61  Pulse: (!) 58  Temp: 98.3 F (36.8 C)  TempSrc: Oral    SpO2: 98%  Weight: 192 lb 12.8 oz (87.5 kg)  Height: 5' 7.5" (1.715 m)   Body mass index is 29.75 kg/m.  Assessment & Plan:   See Encounters Tab for problem based charting.  Patient discussed with Dr. Dareen Piano

## 2018-02-26 NOTE — Assessment & Plan Note (Deleted)
Assessment & Plan: Patient on Atorvastatin 40mg  daily. Has hx of ACSVD risk >10%. Will check LFTs today and also order repeat Lipid panel to ensure she is well-controlled.

## 2018-02-26 NOTE — Assessment & Plan Note (Signed)
Obtained HIV and HepC screens today during blood draw.

## 2018-02-26 NOTE — Patient Instructions (Signed)
It was great seeing you today! I'm so sorry you to hear about your difficulties right now.   Lets work on your pain. I am increasing your Gabapentin to 600mg  three times daily. Please contact clinic and let me know if you have any questions or develop side effects.   Please continue taking the remainder of your medications as you are. I would like to check some routine labs today and also check your kidney and liver function to ensure its still normal.   Please follow-up in 6 months, or sooner if needed!

## 2018-02-27 ENCOUNTER — Encounter: Payer: Medicare Other | Admitting: Internal Medicine

## 2018-02-27 LAB — CMP14 + ANION GAP
ALT: 13 IU/L (ref 0–32)
AST: 13 IU/L (ref 0–40)
Albumin/Globulin Ratio: 1.8 (ref 1.2–2.2)
Albumin: 4.4 g/dL (ref 3.6–4.8)
Alkaline Phosphatase: 67 IU/L (ref 39–117)
Anion Gap: 13 mmol/L (ref 10.0–18.0)
BUN / CREAT RATIO: 14 (ref 12–28)
BUN: 18 mg/dL (ref 8–27)
Bilirubin Total: 0.4 mg/dL (ref 0.0–1.2)
CALCIUM: 9.3 mg/dL (ref 8.7–10.3)
CHLORIDE: 97 mmol/L (ref 96–106)
CO2: 29 mmol/L (ref 20–29)
Creatinine, Ser: 1.27 mg/dL — ABNORMAL HIGH (ref 0.57–1.00)
GFR, EST AFRICAN AMERICAN: 52 mL/min/{1.73_m2} — AB (ref >59)
GFR, EST NON AFRICAN AMERICAN: 45 mL/min/{1.73_m2} — AB (ref >59)
GLUCOSE: 83 mg/dL (ref 65–99)
Globulin, Total: 2.5 g/dL (ref 1.5–4.5)
POTASSIUM: 4.8 mmol/L (ref 3.5–5.2)
Sodium: 139 mmol/L (ref 134–144)
TOTAL PROTEIN: 6.9 g/dL (ref 6.0–8.5)

## 2018-02-27 LAB — HEPATITIS C ANTIBODY: Hep C Virus Ab: <0.1 s/co ratio (ref 0.0–0.9)

## 2018-02-27 LAB — HIV ANTIBODY (ROUTINE TESTING W REFLEX): HIV Screen 4th Generation wRfx: NONREACTIVE

## 2018-02-28 DIAGNOSIS — N289 Disorder of kidney and ureter, unspecified: Secondary | ICD-10-CM | POA: Insufficient documentation

## 2018-02-28 NOTE — Assessment & Plan Note (Addendum)
Assessment:  Checked creatinine today given several recent Rx's for NSAIDs prescribed by neurosurg. Last renal function was obtained 06/2015 which was normal. Cr elevated today at 1.28 with GFR 52. She has no history of significant hyperglycemia to suggest diabetes and historically her BP has been well controlled with diet.     Plan: Advised patient to avoid NSAID use and to remain well-hydrated. We will recheck her renal function at our next visit to ensure improvement.

## 2018-03-01 NOTE — Progress Notes (Signed)
Internal Medicine Clinic Attending  Case discussed with Dr. Molt at the time of the visit.  We reviewed the resident's history and exam and pertinent patient test results.  I agree with the assessment, diagnosis, and plan of care documented in the resident's note. 

## 2018-03-05 DIAGNOSIS — M47816 Spondylosis without myelopathy or radiculopathy, lumbar region: Secondary | ICD-10-CM | POA: Diagnosis not present

## 2018-03-05 DIAGNOSIS — M4316 Spondylolisthesis, lumbar region: Secondary | ICD-10-CM | POA: Diagnosis not present

## 2018-03-06 ENCOUNTER — Ambulatory Visit: Payer: Medicare Other | Admitting: Physical Therapy

## 2018-03-08 ENCOUNTER — Ambulatory Visit: Payer: Medicare Other | Admitting: Physical Therapy

## 2018-03-13 ENCOUNTER — Ambulatory Visit: Payer: Medicare Other | Admitting: Physical Therapy

## 2018-03-15 ENCOUNTER — Ambulatory Visit: Payer: Medicare Other | Admitting: Physical Therapy

## 2018-03-20 ENCOUNTER — Encounter: Payer: Medicare Other | Admitting: Physical Therapy

## 2018-03-22 ENCOUNTER — Encounter: Payer: Medicare Other | Admitting: Physical Therapy

## 2018-03-27 ENCOUNTER — Encounter: Payer: Medicare Other | Admitting: Physical Therapy

## 2018-03-29 ENCOUNTER — Encounter: Payer: Medicare Other | Admitting: Physical Therapy

## 2018-04-24 ENCOUNTER — Other Ambulatory Visit: Payer: Self-pay | Admitting: Internal Medicine

## 2018-04-24 DIAGNOSIS — K219 Gastro-esophageal reflux disease without esophagitis: Secondary | ICD-10-CM

## 2018-04-24 NOTE — Telephone Encounter (Signed)
Next appt scheduled 09/04/18 with PCP. 

## 2018-05-04 ENCOUNTER — Encounter: Payer: Self-pay | Admitting: Internal Medicine

## 2018-05-04 ENCOUNTER — Ambulatory Visit (INDEPENDENT_AMBULATORY_CARE_PROVIDER_SITE_OTHER): Payer: Medicare Other | Admitting: Internal Medicine

## 2018-05-04 ENCOUNTER — Other Ambulatory Visit: Payer: Self-pay

## 2018-05-04 DIAGNOSIS — R252 Cramp and spasm: Secondary | ICD-10-CM | POA: Diagnosis not present

## 2018-05-04 DIAGNOSIS — R239 Unspecified skin changes: Secondary | ICD-10-CM | POA: Diagnosis not present

## 2018-05-04 DIAGNOSIS — R202 Paresthesia of skin: Secondary | ICD-10-CM | POA: Diagnosis not present

## 2018-05-04 NOTE — Assessment & Plan Note (Signed)
Patient is here with complaint of intermittent paresthesias of her right hand with associated hand and forearm cramping as well as skin changes. Episodes last approximately 10 minutes then resolve on their own. She denies a known trigger or correlation with time of day. The numbness and tingling occurs on the dorsal aspect of her hand and involves all digits except her thumb. She also feels the muscles in her forearm cramp, and describes swelling over the area. She denies weakness. She is currently symptoms free and neurologic exam is intact.  She does have occasional next pain, but symptoms do not seem to be correlated. Numbness and tingling radiates from her fingers up her arm. MS is a consideration given her age. Discussed with patient that etiology is currently unclear. Advised her to monitor symptoms. If symptoms worsen, begin to interfere with her daily life, or become persistent, would recommend MRI head and neck and nerve conduction studies for further work up -- Provided reassurance that exam is normal at this time -- Monitory symptoms; would pursue further work up with head and neck MRI to evaluate for MS or cervical etiology and nerve conduction studies if symptoms worsen or persist

## 2018-05-04 NOTE — Progress Notes (Signed)
   CC: Hand numbness  HPI:  [name] is a 62 y.o. female with past medical history outlined below here for hand numbness. For the details of today's visit, please refer to the assessment and plan.  Past Medical History:  Diagnosis Date  . Back pain 07/07/2009  . Bradycardia 05/15/2006  . Bursitis of right shoulder 05/10/2010  . Depression 05/15/2006  . Ecchymoses, spontaneous 08/12/2010  . Fatigue 08/12/2010  . GERD (gastroesophageal reflux disease) 05/15/2006  . Inguinal pain 03/23/2010   right  . Mole (skin) 07/07/2009  . Personal history of goiter 05/15/2006  . Shoulder pain, right 07/07/2009  . Smoker 05/10/2010  . Weight gain 08/12/2010    Review of Systems  Neurological: Positive for tingling. Negative for sensory change and focal weakness.    Physical Exam:  Vitals:   05/04/18 1031  BP: 119/67  Pulse: 66  Temp: 98.7 F (37.1 C)  TempSrc: Oral  SpO2: 99%  Weight: 185 lb 9.6 oz (84.2 kg)  Height: 5' 7.5" (1.715 m)    Constitutional: NAD, appears comfortable Pulmonary/Chest: Breathing comfortably on RA, normal respiratory rate Neurological: A&Ox3, CN II - XII grossly intact. Bilateral upper extremity strength symmetric and intact. Normal tone, sensation to light touch intact. No focal deficits.   Skin: No rashes or erythema  Psychiatric: Normal mood and affect  Assessment & Plan:   See Encounters Tab for problem based charting.  Patient seen with Dr. Evette Doffing

## 2018-05-04 NOTE — Patient Instructions (Addendum)
[name],  It was a pleasure to meet you. I am sorry to hear about your hand. Please let us know if your symptoms get worse or become persistent. We may consider some further imaging and testing. If you have any questions or concerns, call our clinic at [phone] or after hours call [phone] and ask for the internal medicine resident on call. Thank you!  Dr. Philipp Ovens

## 2018-05-04 NOTE — Progress Notes (Signed)
Internal Medicine Clinic Attending  I saw and evaluated the patient.  I personally confirmed the key portions of the history and exam documented by Dr. Philipp Ovens and I reviewed pertinent patient test results.  The assessment, diagnosis, and plan were formulated together and I agree with the documentation in the resident's note.  Intermittent transient right hand cramping and parasthesia. Unclear etiology, her neuro exam is normal today, sensation normal, strength normal. These episodes last 10 minutes, then stop, and are not currently function limiting. I advised supportive care for now. If symptoms worsen, progress, or become function limiting, I would want to eval for upper motor and lower motor neuron lesions as well as peripheral neuropathy with MRI brain, MRI cervical spine, and nerve conduction/EMG studies.

## 2018-05-04 NOTE — Addendum Note (Signed)
Addended by: Lalla Brothers T on: 05/04/2018 01:00 PM   Modules accepted: Level of Service

## 2018-05-07 ENCOUNTER — Encounter: Payer: Self-pay | Admitting: *Deleted

## 2018-09-04 ENCOUNTER — Encounter: Payer: Medicare Other | Admitting: Internal Medicine

## 2018-10-31 ENCOUNTER — Other Ambulatory Visit: Payer: Self-pay | Admitting: Internal Medicine

## 2018-10-31 ENCOUNTER — Other Ambulatory Visit: Payer: Self-pay

## 2018-10-31 DIAGNOSIS — M5416 Radiculopathy, lumbar region: Secondary | ICD-10-CM

## 2018-10-31 DIAGNOSIS — K219 Gastro-esophageal reflux disease without esophagitis: Secondary | ICD-10-CM

## 2018-10-31 MED ORDER — GABAPENTIN 300 MG PO CAPS: 600.0000 mg | ORAL_CAPSULE | Freq: Three times a day (TID) | ORAL | 1 refills | Status: DC

## 2018-10-31 MED ORDER — OMEPRAZOLE 20 MG PO CPDR: 20.0000 mg | DELAYED_RELEASE_CAPSULE | Freq: Two times a day (BID) | ORAL | 0 refills | Status: DC

## 2018-10-31 NOTE — Telephone Encounter (Signed)
gabapentin (NEURONTIN) 300 MG capsule  omeprazole (PRILOSEC) 20 MG capsule  Refill request @  [facility] [phone] - [place], [place] AT [place] [phone] (Phone) [phone] (Fax)

## 2018-12-05 ENCOUNTER — Other Ambulatory Visit: Payer: Self-pay

## 2018-12-05 ENCOUNTER — Encounter: Payer: Self-pay | Admitting: Internal Medicine

## 2018-12-05 ENCOUNTER — Ambulatory Visit (INDEPENDENT_AMBULATORY_CARE_PROVIDER_SITE_OTHER): Payer: Medicare Other | Admitting: Internal Medicine

## 2018-12-05 VITALS — BP 124/72 | HR 56 | Temp 98.9°F | Ht 67.5 in | Wt 185.7 lb

## 2018-12-05 DIAGNOSIS — G8929 Other chronic pain: Secondary | ICD-10-CM | POA: Diagnosis not present

## 2018-12-05 DIAGNOSIS — M67912 Unspecified disorder of synovium and tendon, left shoulder: Secondary | ICD-10-CM | POA: Diagnosis not present

## 2018-12-05 DIAGNOSIS — M549 Dorsalgia, unspecified: Secondary | ICD-10-CM | POA: Diagnosis not present

## 2018-12-05 DIAGNOSIS — M25551 Pain in right hip: Secondary | ICD-10-CM | POA: Diagnosis not present

## 2018-12-05 DIAGNOSIS — M5416 Radiculopathy, lumbar region: Secondary | ICD-10-CM

## 2018-12-05 MED ORDER — TRAMADOL HCL 50 MG PO TABS: 50.0000 mg | ORAL_TABLET | Freq: Four times a day (QID) | ORAL | 0 refills | Status: AC | PRN

## 2018-12-05 NOTE — Progress Notes (Signed)
   CC: right shoulder pain, left hip pain.   HPI:  [name] is a 63 y.o. with PMH as below presenting with left shoulder pain and right hip pain. She states the left should pain has occurred before and she received a steroid shot that helped. The pain is constant and is not relieved by anything. She does not sleep on this shoulder but the other shoulder. She denies numbness or tingling but when she wakes up in the morning she cannot lift her arm. As the day goes on she is able to have full ROM. She feels the pain has returned gradually, and she has been ignoring it as her mother passed away recently and she has been dealing with other things. Her right hip pain is at its worse when she is just standing in place. It radiates down the back of her leg. She has chronic back pain and sees someone in the outpatient setting for this, she believes they might be orthopedists but is unsure as she cannot remember the name. They have given her shots in the spine in the past which helped back pain. She denies pain when going from sitting to standing. She denies numbness or tingling. She has not tried icing the area.   She became tearful filling out the PHQ-9. She stated she's been sad and more depressed since her mother's death and this has caused a lot of stress. She sees [facility] and has been continuing telehealth visits with them. She denies SI and states overall she is holding herself together. She states she has good support system at home.    Please see A&P for assessment of the patient's acute and chronic medical conditions.   Past Medical History:  Diagnosis Date  . Back pain 07/07/2009  . Bradycardia 05/15/2006  . Bursitis of right shoulder 05/10/2010  . Depression 05/15/2006  . Ecchymoses, spontaneous 08/12/2010  . Fatigue 08/12/2010  . GERD (gastroesophageal reflux disease) 05/15/2006  . Inguinal pain 03/23/2010   right  . Mole (skin) 07/07/2009  . Personal history of goiter 05/15/2006   . Shoulder pain, right 07/07/2009  . Smoker 05/10/2010  . Weight gain 08/12/2010   Review of Systems:   ROS negative except as noted in HPI.   Physical Exam:  Constitution: NAD, pleasant  Cardio: RRR, no m/r/g  Respiratory: CTA, no w/r/r  MSK: no LE edema, gait normal, full active ROM without pain - flexion, extension, abduction, adduction. Passive ROM had mildly positive straight leg test, no pain with IT band stretch, TTP over glut med. Strength 4/5 on left, 5/5 right.   For UE she has full passive ROM, no pain with internal/external rotation with resistance, +pain with empty can test, negative drop test, NTTP shoulder area, strength 5/5 bilaterally.  Neuro: a&o, tearful when talking about her mother  Skin: c/d/i    Vitals:   12/05/18 1408  BP: 124/72  Pulse: (!) 56  Temp: 98.9 F (37.2 C)  TempSrc: Oral  SpO2: 98%  Weight: 185 lb 11.2 oz (84.2 kg)  Height: 5' 7.5" (1.715 m)    Assessment & Plan:   See Encounters Tab for problem based charting.  Patient discussed with Dr. Daryll Drown

## 2018-12-05 NOTE — Patient Instructions (Signed)
Thank you for allowing [place] to provide your care today. Today we discussed your left shoulder pain and right hip pain.   I have ordered the following labs for you:  Basic metabolic panel    I will call if any are abnormal.    Today we made the following changes to your medications:   Please START taking  Tramadol (ULTRAM) 50 MG tablet every six hours as needed for left shoulder pain or right hip pain   Please STOP taking   Please follow-up on the afternoon of Friday, May 15th.    Should you have any questions or concerns please call the internal medicine clinic at [phone].

## 2018-12-06 LAB — BMP8+ANION GAP
Anion Gap: 13 mmol/L (ref 10.0–18.0)
BUN/Creatinine Ratio: 13 (ref 12–28)
BUN: 10 mg/dL (ref 8–27)
CO2: 27 mmol/L (ref 20–29)
Calcium: 9.2 mg/dL (ref 8.7–10.3)
Chloride: 98 mmol/L (ref 96–106)
Creatinine, Ser: 0.79 mg/dL (ref 0.57–1.00)
GFR calc Af Amer: 91 mL/min/{1.73_m2} (ref >59)
GFR calc non Af Amer: 79 mL/min/{1.73_m2} (ref >59)
Glucose: 107 mg/dL — ABNORMAL HIGH (ref 65–99)
Potassium: 4.6 mmol/L (ref 3.5–5.2)
Sodium: 138 mmol/L (ref 134–144)

## 2018-12-07 DIAGNOSIS — M25551 Pain in right hip: Secondary | ICD-10-CM | POA: Insufficient documentation

## 2018-12-07 NOTE — Assessment & Plan Note (Addendum)
She states the left should pain has occurred before and she received a steroid shot that helped. The pain is similar to that pain two years ago. The pain is constant and the only thing that has really helped is tramadol in the past. She does not sleep on this shoulder but the right shoulder. She denies numbness or tingling but when she wakes up in the morning she cannot lift her arm at all. She has to use her other arm to lift it. This resolves as the morning goes on, and as the day goes on she is able to have full ROM. She feels the pain has returned gradually, and she has been ignoring it as her mother passed away recently and she has been dealing with other things. On PE she has full active and passive ROM, is NTTP, and her most severe pain was with empty can test. However, she does not appear to have muscle weakness that would indicate rotator cuff injury. When she indicates area of pain it appears to likely be the shoulder joint. Last BMP 02/2018 showed AKI.   - f/u 5/15 for possible steroid injection - 5 days tramadol q6h prn pain  - NSAIDs prn if normal BMP

## 2018-12-07 NOTE — Assessment & Plan Note (Addendum)
Her right hip pain is at its worse when she is just standing in place. It radiates down the back of her leg. She has chronic back pain and sees someone in the outpatient setting for this, she believes they might be orthopedics but is unsure as she cannot remember the name. Per [facility] problem list review it appears she was seeing neurology for lumbar pain previously. They have given her shots in the spine in the past which helped back pain. She denies pain when going from sitting to standing. She denies numbness or tingling. She has not tried icing the area.   Previously had a lumbar MRI in 2013 which showed disc protrusion of L5-S1 dermatome with mass effect on the left S1 nerve root.   - Overall symptoms appear consistent with muscular/nerve/sciatic pain but with her history of stenosis on lumbar MRI and the right appearing slightly weaker than the right I think repeat MRI is warranted  - repeat BMP as previously had AKI 02/2018 - continue gabapentin 300 mg tid

## 2018-12-07 NOTE — Progress Notes (Signed)
Internal Medicine Clinic Attending  Case discussed with Dr. Sharon Seller at the time of the visit.  We reviewed the resident's history and exam and pertinent patient test results.  I agree with the assessment, diagnosis, and plan of care documented in the resident's note.     Please note there are no in person visits on 5/15, so patient was scheduled on 5/18 for assessment for shoulder injection.

## 2018-12-10 ENCOUNTER — Encounter: Payer: Self-pay | Admitting: Internal Medicine

## 2018-12-10 ENCOUNTER — Ambulatory Visit (INDEPENDENT_AMBULATORY_CARE_PROVIDER_SITE_OTHER): Payer: Medicare Other | Admitting: Internal Medicine

## 2018-12-10 ENCOUNTER — Other Ambulatory Visit: Payer: Self-pay

## 2018-12-10 DIAGNOSIS — M67912 Unspecified disorder of synovium and tendon, left shoulder: Secondary | ICD-10-CM

## 2018-12-10 NOTE — Patient Instructions (Signed)
Thank you for coming to the clinic today. It was a pleasure to see you.   For your shoulder pain - Today we gave you a steroid injection. Try these exercises to keep your shoulder active. Try tylenol and ice or heat to relieve your pain.   Shoulder Range of Motion Exercises Shoulder range of motion (ROM) exercises are done to keep the shoulder moving freely or to increase movement. They are often recommended for people who have shoulder pain or stiffness or who are recovering from a shoulder surgery. Phase 1 exercises When you are able, do this exercise 1-2 times per day for 30-60 seconds in each direction, or as directed by your health care provider. Pendulum exercise To do this exercise while sitting: 1. Sit in a chair or at the edge of your bed with your feet flat on the floor. 2. Let your affected arm hang down in front of you over the edge of the bed or chair. 3. Relax your shoulder, arm, and hand. [place] your body so your arm gently swings in small circles. You can also use your unaffected arm to start the motion. 5. Repeat changing the direction of the circles, swinging your arm left and right, and swinging your arm forward and back. To do this exercise while standing: 1. Stand next to a sturdy chair or table, and hold on to it with your hand on your unaffected side. 2. Bend forward at the waist. 3. Bend your knees slightly. 4. Relax your shoulder, arm, and hand. 5. While keeping your shoulder relaxed, use body motion to swing your arm in small circles. 6. Repeat changing the direction of the circles, swinging your arm left and right, and swinging your arm forward and back. 7. Between exercises, stand up tall and take a short break to relax your lower back.  Phase 2 exercises Do these exercises 1-2 times per day or as told by your health care provider. Hold each stretch for 30 seconds, and repeat 3 times. Do the exercises with one or both arms as instructed by your health care  provider. For these exercises, sit at a table with your hand and arm supported by the table. A chair that slides easily or has wheels can be helpful. External rotation 1. Turn your chair so that your affected side is nearest to the table. 2. Place your forearm on the table to your side. Bend your elbow about 90 at the elbow (right angle) and place your hand palm facing down on the table. Your elbow should be about 6 inches away from your side. 3. Keeping your arm on the table, lean your body forward. Abduction 1. Turn your chair so that your affected side is nearest to the table. 2. Place your forearm and hand on the table so that your thumb points toward the ceiling and your arm is straight out to your side. 3. Slide your hand out to the side and away from you, using your unaffected arm to do the work. 4. To increase the stretch, you can slide your chair away from the table. Flexion: forward stretch 1. Sit facing the table. Place your hand and elbow on the table in front of you. 2. Slide your hand forward and away from you, using your unaffected arm to do the work. 3. To increase the stretch, you can slide your chair backward. Phase 3 exercises Do these exercises 1-2 times per day or as told by your health care provider. Hold each stretch for  30 seconds, and repeat 3 times. Do the exercises with one or both arms as instructed by your health care provider. Cross-body stretch: posterior capsule stretch 1. Lift your arm straight out in front of you. 2. Bend your arm 90 at the elbow (right angle) so your forearm moves across your body. 3. Use your other arm to gently pull the elbow across your body, toward your other shoulder. Wall climbs 1. Stand with your affected arm extended out to the side with your hand resting on a door frame. 2. Slide your hand slowly up the door frame. 3. To increase the stretch, step through the door frame. Keep your body upright and do not lean. Wand exercises You  will need a cane, a piece of PVC pipe, or a sturdy wooden dowel for wand exercises. Flexion To do this exercise while standing: 1. Hold the wand with both of your hands, palms down. 2. Using the other arm to help, lift your arms up and over your head, if able. 3. Push upward with your other arm to gently increase the stretch. To do this exercise while lying down: 1. Lie on your back with your elbows resting on the floor and the wand in both your hands. Your hands will be palm down, or pointing toward your feet. 2. Lift your hands toward the ceiling, using your unaffected arm to help if needed. 3. Bring your arms overhead as able, using your unaffected arm to help if needed. Internal rotation 1. Stand while holding the wand behind you with both hands. Your unaffected arm should be extended above your head with the arm of the affected side extended behind you at the level of your waist. The wand should be pointing straight up and down as you hold it. 2. Slowly pull the wand up behind your back by straightening the elbow of your unaffected arm and bending the elbow of your affected arm. External rotation 1. Lie on your back with your affected upper arm supported on a small pillow or rolled towel. When you first do this exercise, keep your upper arm close to your body. Over time, bring your arm up to a 90 angle out to the side. 2. Hold the wand across your stomach and with both hands palm up. Your elbow on your affected side should be bent at a 90 angle. 3. Use your unaffected side to help push your forearm away from you and toward the floor. Keep your elbow on your affected side bent at a 90 angle. Contact a health care provider if you have:  New or increasing pain.  New numbness, tingling, weakness, or discoloration in your arm or hand. This information is not intended to replace advice given to you by your health care provider. Make sure you discuss any questions you have with your health  care provider. Document Released: 04/09/2003 Document Revised: 08/23/2017 Document Reviewed: 08/23/2017 Elsevier Interactive Patient Education  2019 [place].   Please call the internal medicine center clinic if you have any questions or concerns, we may be able to help and keep you from a long and expensive emergency room wait. Our clinic and after hours phone number is [phone], the best time to call is Monday through Friday 9 am to 4 pm but there is always someone available 24/7 if you have an emergency. If you need medication refills please notify your pharmacy one week in advance and they will send [place] a request.

## 2018-12-10 NOTE — Progress Notes (Signed)
CC: follow up evaluation of shoulder pain   HPI:  [name] is a 63 y.o. with PMH as listed below who presents for follow up evaluation of shoulder pain. Please see the assessment and plans for the status of the patient chronic medical problems.    Past Medical History:  Diagnosis Date   Back pain 07/07/2009   Bradycardia 05/15/2006   Bursitis of right shoulder 05/10/2010   Depression 05/15/2006   Ecchymoses, spontaneous 08/12/2010   Fatigue 08/12/2010   GERD (gastroesophageal reflux disease) 05/15/2006   Inguinal pain 03/23/2010   right   Mole (skin) 07/07/2009   Personal history of goiter 05/15/2006   Shoulder pain, right 07/07/2009   Smoker 05/10/2010   Weight gain 08/12/2010   Review of Systems:  Refer to history of present illness and assessment and plans for pertinent review of systems, all others reviewed and negative  Physical Exam:  Vitals:   12/10/18 0953  BP: 129/72  Pulse: (!) 56  Temp: 98.2 F (36.8 C)  TempSrc: Oral  SpO2: 95%  Weight: 185 lb 8 oz (84.1 kg)  Height: 5' 7.5" (1.715 m)   General: well appearing, no acute distress  MSK: Left shoulder active abduction is limited to about 45 degrees. Passive abduction can be achieved to greater than 90 degrees. Active and passive internal and external rotation of the shoulder are full and not limited by pain. No warmth, swelling or redness of the shoulder. Ultrasound exam confirms an intact biceps tendon without inflammation. The [facility] joint has bony irregularity with what could be some surrounding inflammation. There is no glenohumeral joint effusion. There is no bulging of the rotator cuff tendons with abduction under ultrasound.   Assessment & Plan:   Left Shoulder Rotator Cuff Tendinitis  Patient presents for evaluation of posterior left shoulder pain. Pain is constant through the day. She has difficulty lifting the left arm which seems to be worse in the morning. She does not sleep on  the left side. Has had similar pain in the past which relieved with steroid injection.   A Exam consistent with rotator cuff tendinitis, no signs of impingement at this time. She is at risk for adhesive capsulitis   P Steroid injection today  Provided PT exercises for supporting range of motion of the shoulder    PROCEDURE NOTE  PROCEDURE: left shoulder joint steroid injection.  PREOPERATIVE DIAGNOSIS: Tendinitis of the left shoulder.  POSTOPERATIVE DIAGNOSIS: Tendinitis of the left shoulder.  PROCEDURE: The patient was apprised of the risks and the benefits of the procedure and informed consent was obtained, as witnessed by [employer]. Time-out procedure was performed, with confirmation of the patient's name, date of birth, and correct identification of the left shoulder to be injected. The patient's shoulder was then marked at the appropriate site for injection placement. The shoulder was sterilely prepped with Betadine. A 40 mg (1 milliliter) solution of Kenalog was drawn up into a 5 mL syringe with a 1 mL of 1% lidocaine. The patient was injected with a 25 gauge needle at the posterior  aspect of her  left shoulder. There were no complications. The patient tolerated the procedure well. There was minimal bleeding. The patient was instructed to ice her shoulder upon leaving clinic and refrain from overuse over the next 3 days. The patient was instructed to go to the emergency room with any usual pain, swelling, or redness occurred in the injected area. The patient was given a followup appointment to evaluate  response to the injection to his increased range of motion and reduction of pain.  The procedure was supervised by attending physician, Dr. Angelia Mould.   See Encounters Tab for problem based charting.  Patient seen with Dr. Angelia Mould

## 2018-12-11 ENCOUNTER — Encounter: Payer: Self-pay | Admitting: Internal Medicine

## 2018-12-11 NOTE — Assessment & Plan Note (Signed)
Patient presents for evaluation of posterior left shoulder pain. Pain is constant through the day. She has difficulty lifting the left arm which seems to be worse in the morning. She does not sleep on the left side. Has had similar pain in the past which relieved with steroid injection.   A Exam consistent with rotator cuff tendinitis, no signs of impingement at this time. She is at risk for adhesive capsulitis   P Steroid injection today  Provided PT exercises for supporting range of motion of the shoulder

## 2018-12-12 NOTE — Progress Notes (Signed)
Internal Medicine Clinic Attending  I saw and evaluated the patient.  I personally confirmed the key portions of the history and exam documented by Dr. Blum and I reviewed pertinent patient test results.  The assessment, diagnosis, and plan were formulated together and I agree with the documentation in the resident's note.   I was present for the entire procedure.  

## 2018-12-14 ENCOUNTER — Ambulatory Visit (HOSPITAL_COMMUNITY): Admission: RE | Admit: 2018-12-14 | Payer: Medicare Other | Source: Ambulatory Visit

## 2018-12-26 ENCOUNTER — Other Ambulatory Visit: Payer: Self-pay | Admitting: Internal Medicine

## 2018-12-26 DIAGNOSIS — M5416 Radiculopathy, lumbar region: Secondary | ICD-10-CM

## 2019-01-21 ENCOUNTER — Other Ambulatory Visit: Payer: Self-pay | Admitting: Internal Medicine

## 2019-01-21 DIAGNOSIS — K219 Gastro-esophageal reflux disease without esophagitis: Secondary | ICD-10-CM

## 2019-01-25 ENCOUNTER — Other Ambulatory Visit: Payer: Self-pay | Admitting: Internal Medicine

## 2019-01-25 DIAGNOSIS — M5416 Radiculopathy, lumbar region: Secondary | ICD-10-CM

## 2019-02-03 DIAGNOSIS — H5213 Myopia, bilateral: Secondary | ICD-10-CM | POA: Diagnosis not present

## 2019-02-12 ENCOUNTER — Telehealth: Payer: Self-pay | Admitting: *Deleted

## 2019-02-12 NOTE — Telephone Encounter (Signed)
Call placed to patient to offer an AWV. Patient would like to do this via telehealth on 02/19/2019 at 1:30. Hubbard Hartshorn, RN, BSN

## 2019-02-19 ENCOUNTER — Ambulatory Visit (INDEPENDENT_AMBULATORY_CARE_PROVIDER_SITE_OTHER): Payer: Medicare Other | Admitting: Internal Medicine

## 2019-02-19 ENCOUNTER — Encounter: Payer: Self-pay | Admitting: Internal Medicine

## 2019-02-19 ENCOUNTER — Other Ambulatory Visit: Payer: Self-pay

## 2019-02-19 ENCOUNTER — Encounter (INDEPENDENT_AMBULATORY_CARE_PROVIDER_SITE_OTHER): Payer: Self-pay

## 2019-02-19 DIAGNOSIS — Z0000 Encounter for general adult medical examination without abnormal findings: Secondary | ICD-10-CM

## 2019-02-19 DIAGNOSIS — Z1239 Encounter for other screening for malignant neoplasm of breast: Secondary | ICD-10-CM

## 2019-02-19 NOTE — Patient Instructions (Addendum)
Annual Wellness Visit   Medicare Covered Preventative Screenings and Services  Services & Screenings Men and Women Who How Often Need? Date of Last Service Action  Abdominal Aortic Aneurysm Adults with AAA risk factors Once     Alcohol Misuse and Counseling All Adults Screening once a year if no alcohol misuse. Counseling up to 4 face to face sessions.     Bone Density Measurement  Adults at risk for osteoporosis Once every 2 yrs     Lipid Panel Z13.6 All adults without CV disease Once every 5 yrs Yes    Colorectal Cancer   Stool sample or  Colonoscopy All adults 28 and older   Once every year  Every 10 years     Depression All Adults Once a year Yes Today Please call to schedule appt with your counselor  Diabetes Screening Blood glucose, post glucose load, or GTT Z13.1  All adults at risk  Pre-diabetics  Once per year  Twice per year     Diabetes  Self-Management Training All adults Diabetics 10 hrs first year; 2 hours subsequent years. Requires Copay     Glaucoma  Diabetics  Family history of glaucoma  African Americans 41 yrs +  Hispanic Americans 61 yrs + Annually - requires coppay     Hepatitis C Z72.89 or F19.20  High Risk for HCV  Born between 1945 and 1965  Annually  Once     HIV Z11.4 All adults based on risk  Annually btw ages 63 & 76 regardless of risk  Annually > 65 yrs if at increased risk     Lung Cancer Screening Asymptomatic adults aged 49-77 with 30 pack yr history and current smoker OR quit within the last 15 yrs Annually Must have counseling and shared decision making documentation before first screen     Medical Nutrition Therapy Adults with   Diabetes  Renal disease  Kidney transplant within past 3 yrs 3 hours first year; 2 hours subsequent years     Obesity and Counseling All adults Screening once a year Counseling if BMI 30 or higher Yes Today You have set a weight loss goal of < 169 lbs  Tobacco Use Counseling Adults who use  tobacco  Up to 8 visits in one year Yes   Contact [place] Quitline  Vaccines Z23  Hepatitis B  Influenza   Pneumonia  Adults   Once  Once every flu season  Two different vaccines separated by one year   Yes   Flu vaccine starting  September 1  Next Annual Wellness Visit People with Medicare Every year  Today     Services & Screenings Women Who How Often Need  Date of Last Service Action  Mammogram  Z12.31 Women over 31 One baseline ages 15-39. Annually ager 40 yrs+ Yes  Please [facility] if you don't hear from them.  Pap tests All women Annually if high risk. Every 2 yrs for normal risk women     Screening for cervical cancer with   Pap (Z01.419 nl or Z01.411abnl) &  HPV Z11.51 Women aged 69 to 34 Once every 5 yrs     Screening pelvic and breast exams All women Annually if high risk. Every 2 yrs for normal risk women     Sexually Transmitted Diseases  Chlamydia  Gonorrhea  Syphilis All at risk adults Annually for non pregnant females at increased risk         [place]  Men Who How Ofter Need  Date of Last Service Action  Prostate Cancer - DRE & PSA Men over 50 Annually.  DRE might require a copay.     Sexually Transmitted Diseases  Syphilis All at risk adults Annually for men at increased risk         Things That May Be Affecting Your Health:  Alcohol  Hearing loss X Pain    Depression  Home Safety  Sexual Health   Diabetes  Lack of physical activity  Stress   Difficulty with daily activities  Loneliness  Tiredness   Drug use  Medicines X Tobacco use   Falls  Motor Vehicle Safety X Weight   Food choices  Oral Health  Other    YOUR PERSONALIZED HEALTH PLAN : 1. Schedule your next subsequent Medicare Wellness visit in one year 2. Attend all of your regular appointments to address your medical issues 3. Complete the preventative screenings and services 4. Please call to schedule appt with your counselor 5.  Please call [facility] if you don't hear from them. 6. Call 1-800-QUIT-NOW for resources to help quit smoking 7. Begin seated and standing exercises with the exercise band 8. Best wishes for your weight loss goal of < 169 lbs! Consider calling our office to schedule appt with Debera Lat for help with food/meal choices. 9. Pick up and resume taking Vit D 1000 units everyday    Fall Prevention in the Home, Adult Falls can cause injuries. They can happen to people of all ages. There are many things you can do to make your home safe and to help prevent falls. Ask for help when making these changes, if needed. What actions can I take to prevent falls? General Instructions  Use good lighting in all rooms. Replace any light bulbs that burn out.  Turn on the lights when you go into a dark area. Use night-lights.  Keep items that you use often in easy-to-reach places. Lower the shelves around your home if necessary.  Set up your furniture so you have a clear path. Avoid moving your furniture around.  Do not have throw rugs and other things on the floor that can make you trip.  Avoid walking on wet floors.  If any of your floors are uneven, fix them.  Add color or contrast paint or tape to clearly mark and help you see: ? Any grab bars or handrails. ? First and last steps of stairways. ? Where the edge of each step is.  If you use a stepladder: ? Make sure that it is fully opened. Do not climb a closed stepladder. ? Make sure that both sides of the stepladder are locked into place. ? Ask someone to hold the stepladder for you while you use it.  If there are any pets around you, be aware of where they are. What can I do in the bathroom?      Keep the floor dry. Clean up any water that spills onto the floor as soon as it happens.  Remove soap buildup in the tub or shower regularly.  Use non-skid mats or decals on the floor of the tub or shower.   Attach bath mats securely with double-sided, non-slip rug tape.  If you need to sit down in the shower, use a plastic, non-slip stool.  Install grab bars by the toilet and in the tub and shower. Do not use towel bars as grab bars. What can I do  in the bedroom?  Make sure that you have a light by your bed that is easy to reach.  Do not use any sheets or blankets that are too big for your bed. They should not hang down onto the floor.  Have a firm chair that has side arms. You can use this for support while you get dressed. What can I do in the kitchen?  Clean up any spills right away.  If you need to reach something above you, use a strong step stool that has a grab bar.  Keep electrical cords out of the way.  Do not use floor polish or wax that makes floors slippery. If you must use wax, use non-skid floor wax. What can I do with my stairs?  Do not leave any items on the stairs.  Make sure that you have a light switch at the top of the stairs and the bottom of the stairs. If you do not have them, ask someone to add them for you.  Make sure that there are handrails on both sides of the stairs, and use them. Fix handrails that are broken or loose. Make sure that handrails are as long as the stairways.  Install non-slip stair treads on all stairs in your home.  Avoid having throw rugs at the top or bottom of the stairs. If you do have throw rugs, attach them to the floor with carpet tape.  Choose a carpet that does not hide the edge of the steps on the stairway.  Check any carpeting to make sure that it is firmly attached to the stairs. Fix any carpet that is loose or worn. What can I do on the outside of my home?  Use bright outdoor lighting.  Regularly fix the edges of walkways and driveways and fix any cracks.  Remove anything that might make you trip as you walk through a door, such as a raised step or threshold.  Trim any bushes or trees on the path to your home.   Regularly check to see if handrails are loose or broken. Make sure that both sides of any steps have handrails.  Install guardrails along the edges of any raised decks and porches.  Clear walking paths of anything that might make someone trip, such as tools or rocks.  Have any leaves, snow, or ice cleared regularly.  Use sand or salt on walking paths during winter.  Clean up any spills in your garage right away. This includes grease or oil spills. What other actions can I take?  Wear shoes that: ? Have a low heel. Do not wear high heels. ? Have rubber bottoms. ? Are comfortable and fit you well. ? Are closed at the toe. Do not wear open-toe sandals.  Use tools that help you move around (mobility aids) if they are needed. These include: ? Canes. ? Walkers. ? Scooters. ? Crutches.  Review your medicines with your doctor. Some medicines can make you feel dizzy. This can increase your chance of falling. Ask your doctor what other things you can do to help prevent falls. Where to find more information  Centers for Disease Control and Prevention, STEADI: https://garcia.biz/  [employer] on Aging: BrainJudge.co.uk Contact a doctor if:  You are afraid of falling at home.  You feel weak, drowsy, or dizzy at home.  You fall at home. Summary  There are many simple things that you can do to make your home safe and to help prevent falls.  Ways to make  your home safe include removing tripping hazards and installing grab bars in the bathroom.  Ask for help when making these changes in your home. This information is not intended to replace advice given to you by your health care provider. Make sure you discuss any questions you have with your health care provider. Document Released: 05/07/2009 Document Revised: 11/01/2018 Document Reviewed: 02/23/2017 Elsevier Patient Education  2020 [place] Maintenance, Female Adopting a healthy lifestyle and getting  preventive care are important in promoting health and wellness. Ask your health care provider about:  The right schedule for you to have regular tests and exams.  Things you can do on your own to prevent diseases and keep yourself healthy. What should I know about diet, weight, and exercise? Eat a healthy diet   Eat a diet that includes plenty of vegetables, fruits, low-fat dairy products, and lean protein.  Do not eat a lot of foods that are high in solid fats, added sugars, or sodium. Maintain a healthy weight Body mass index (BMI) is used to identify weight problems. It estimates body fat based on height and weight. Your health care provider can help determine your BMI and help you achieve or maintain a healthy weight. Get regular exercise Get regular exercise. This is one of the most important things you can do for your health. Most adults should:  Exercise for at least 150 minutes each week. The exercise should increase your heart rate and make you sweat (moderate-intensity exercise).  Do strengthening exercises at least twice a week. This is in addition to the moderate-intensity exercise.  Spend less time sitting. Even light physical activity can be beneficial. Watch cholesterol and blood lipids Have your blood tested for lipids and cholesterol at 64 years of age, then have this test every 5 years. Have your cholesterol levels checked more often if:  Your lipid or cholesterol levels are high.  You are older than 64 years of age.  You are at high risk for heart disease. What should I know about cancer screening? Depending on your health history and family history, you may need to have cancer screening at various ages. This may include screening for:  Breast cancer.  Cervical cancer.  Colorectal cancer.  Skin cancer.  Lung cancer. What should I know about heart disease, diabetes, and high blood pressure? Blood pressure and heart disease  High blood pressure causes  heart disease and increases the risk of stroke. This is more likely to develop in people who have high blood pressure readings, are of African descent, or are overweight.  Have your blood pressure checked: ? Every 3-5 years if you are 52-50 years of age. ? Every year if you are 29 years old or older. Diabetes Have regular diabetes screenings. This checks your fasting blood sugar level. Have the screening done:  Once every three years after age 48 if you are at a normal weight and have a low risk for diabetes.  More often and at a younger age if you are overweight or have a high risk for diabetes. What should I know about preventing infection? Hepatitis B If you have a higher risk for hepatitis B, you should be screened for this virus. Talk with your health care provider to find out if you are at risk for hepatitis B infection. Hepatitis C Testing is recommended for:  Everyone born from [birth year] through 1966.  Anyone with known risk factors for hepatitis C. Sexually transmitted infections (STIs)  Get screened  for STIs, including gonorrhea and chlamydia, if: ? You are sexually active and are younger than 64 years of age. ? You are older than 64 years of age and your health care provider tells you that you are at risk for this type of infection. ? Your sexual activity has changed since you were last screened, and you are at increased risk for chlamydia or gonorrhea. Ask your health care provider if you are at risk.  Ask your health care provider about whether you are at high risk for HIV. Your health care provider may recommend a prescription medicine to help prevent HIV infection. If you choose to take medicine to prevent HIV, you should first get tested for HIV. You should then be tested every 3 months for as long as you are taking the medicine. Pregnancy  If you are about to stop having your period (premenopausal) and you may become pregnant, seek counseling before you get pregnant.  Take  400 to 800 micrograms (mcg) of folic acid every day if you become pregnant.  Ask for birth control (contraception) if you want to prevent pregnancy. Osteoporosis and menopause Osteoporosis is a disease in which the bones lose minerals and strength with aging. This can result in bone fractures. If you are 8 years old or older, or if you are at risk for osteoporosis and fractures, ask your health care provider if you should:  Be screened for bone loss.  Take a calcium or vitamin D supplement to lower your risk of fractures.  Be given hormone replacement therapy (HRT) to treat symptoms of menopause. Follow these instructions at home: Lifestyle  Do not use any products that contain nicotine or tobacco, such as cigarettes, e-cigarettes, and chewing tobacco. If you need help quitting, ask your health care provider.  Do not use street drugs.  Do not share needles.  Ask your health care provider for help if you need support or information about quitting drugs. Alcohol use  Do not drink alcohol if: ? Your health care provider tells you not to drink. ? You are pregnant, may be pregnant, or are planning to become pregnant.  If you drink alcohol: ? Limit how much you use to 0-1 drink a day. ? Limit intake if you are breastfeeding.  Be aware of how much alcohol is in your drink. In the [place] one drink equals one 12 oz bottle of beer (355 mL), one 5 oz glass of wine (148 mL), or one 1 oz glass of hard liquor (44 mL). General instructions  Schedule regular health, dental, and eye exams.  Stay current with your vaccines.  Tell your health care provider if: ? You often feel depressed. ? You have ever been abused or do not feel safe at home. Summary  Adopting a healthy lifestyle and getting preventive care are important in promoting health and wellness.  Follow your health care provider's instructions about healthy diet, exercising, and getting tested or screened for diseases.  Follow  your health care provider's instructions on monitoring your cholesterol and blood pressure. This information is not intended to replace advice given to you by your health care provider. Make sure you discuss any questions you have with your health care provider. Document Released: 01/24/2011 Document Revised: 07/04/2018 Document Reviewed: 07/04/2018 Elsevier Patient Education  2020 [employer].   Steps to Quit Smoking Smoking tobacco is the leading cause of preventable death. It can affect almost every organ in the body. Smoking puts you and people around you at risk  for many serious, long-lasting (chronic) diseases. Quitting smoking can be hard, but it is one of the best things that you can do for your health. It is never too late to quit. How do I get ready to quit? When you decide to quit smoking, make a plan to help you succeed. Before you quit:  Pick a date to quit. Set a date within the next 2 weeks to give you time to prepare.  Write down the reasons why you are quitting. Keep this list in places where you will see it often.  Tell your family, friends, and co-workers that you are quitting. Their support is important.  Talk with your doctor about the choices that may help you quit.  Find out if your health insurance will pay for these treatments.  Know the people, places, things, and activities that make you want to smoke (triggers). Avoid them. What first steps can I take to quit smoking?  Throw away all cigarettes at home, at work, and in your car.  Throw away the things that you use when you smoke, such as ashtrays and lighters.  Clean your car. Make sure to empty the ashtray.  Clean your home, including curtains and carpets. What can I do to help me quit smoking? Talk with your doctor about taking medicines and seeing a counselor at the same time. You are more likely to succeed when you do both.  If you are pregnant or breastfeeding, talk with your doctor about  counseling or other ways to quit smoking. Do not take medicine to help you quit smoking unless your doctor tells you to do so. To quit smoking: Quit right away  Quit smoking totally, instead of slowly cutting back on how much you smoke over a period of time.  Go to counseling. You are more likely to quit if you go to counseling sessions regularly. Take medicine You may take medicines to help you quit. Some medicines need a prescription, and some you can buy over-the-counter. Some medicines may contain a drug called nicotine to replace the nicotine in cigarettes. Medicines may:  Help you to stop having the desire to smoke (cravings).  Help to stop the problems that come when you stop smoking (withdrawal symptoms). Your doctor may ask you to use:  Nicotine patches, gum, or lozenges.  Nicotine inhalers or sprays.  Non-nicotine medicine that is taken by mouth. Find resources Find resources and other ways to help you quit smoking and remain smoke-free after you quit. These resources are most helpful when you use them often. They include:  Online chats with a Social worker.  Phone quitlines.  Printed Furniture conservator/restorer.  Support groups or group counseling.  Text messaging programs.  Mobile phone apps. Use apps on your mobile phone or tablet that can help you stick to your quit plan. There are many free apps for mobile phones and tablets as well as websites. Examples include Quit Guide from the [employer] and smokefree.gov  What things can I do to make it easier to quit?   Talk to your family and friends. Ask them to support and encourage you.  Call a phone quitline (1-800-QUIT-NOW), reach out to support groups, or work with a Social worker.  Ask people who smoke to not smoke around you.  Avoid places that make you want to smoke, such as: ? Bars. ? Parties. ? Smoke-break areas at work.  Spend time with people who do not smoke.  Lower the stress in your life. Stress can make you  want to  smoke. Try these things to help your stress: ? Getting regular exercise. ? Doing deep-breathing exercises. ? Doing yoga. ? Meditating. ? Doing a body scan. To do this, close your eyes, focus on one area of your body at a time from head to toe. Notice which parts of your body are tense. Try to relax the muscles in those areas. How will I feel when I quit smoking? Day 1 to 3 weeks Within the first 24 hours, you may start to have some problems that come from quitting tobacco. These problems are very bad 2-3 days after you quit, but they do not often last for more than 2-3 weeks. You may get these symptoms:  Mood swings.  Feeling restless, nervous, angry, or annoyed.  Trouble concentrating.  Dizziness.  Strong desire for high-sugar foods and nicotine.  Weight gain.  Trouble pooping (constipation).  Feeling like you may vomit (nausea).  Coughing or a sore throat.  Changes in how the medicines that you take for other issues work in your body.  Depression.  Trouble sleeping (insomnia). Week 3 and afterward After the first 2-3 weeks of quitting, you may start to notice more positive results, such as:  Better sense of smell and taste.  Less coughing and sore throat.  Slower heart rate.  Lower blood pressure.  Clearer skin.  Better breathing.  Fewer sick days. Quitting smoking can be hard. Do not give up if you fail the first time. Some people need to try a few times before they succeed. Do your best to stick to your quit plan, and talk with your doctor if you have any questions or concerns. Summary  Smoking tobacco is the leading cause of preventable death. Quitting smoking can be hard, but it is one of the best things that you can do for your health.  When you decide to quit smoking, make a plan to help you succeed.  Quit smoking right away, not slowly over a period of time.  When you start quitting, seek help from your doctor, family, or friends. This information  is not intended to replace advice given to you by your health care provider. Make sure you discuss any questions you have with your health care provider. Document Released: 05/07/2009 Document Revised: 09/28/2018 Document Reviewed: 09/29/2018 Elsevier Patient Education  2020 [employer].

## 2019-02-19 NOTE — Progress Notes (Signed)
This AWV is being conducted by [facility] only. The patient was located at home and I was located in [facility]. The patient's identity was confirmed using their DOB and current address. The patient or his/her legal guardian has consented to being evaluated through a telephone encounter and understands the associated risks (an examination cannot be done and the patient may need to come in for an appointment) / benefits (allows the patient to remain at home, decreasing exposure to coronavirus). I personally spent 47 minutes conducting the AWV.  Subjective:   [name] is a 63 y.o. female who presents for a Medicare Annual Wellness Visit.  The following items have been reviewed and updated today in the appropriate area in the EMR.   Health Risk Assessment  Height, weight, BMI, and BP Visual acuity if needed Depression screen Fall risk / safety level Advance directive discussion Medical and family history were reviewed and updated Updating list of other providers & suppliers Medication reconciliation, including over the counter medicines Cognitive screen Written screening schedule Risk Factor list Personalized health advice, risky behaviors, and treatment advice  Social History   Social History Narrative   Current Social History 02/19/2019        Patient lives with family (Daughter, son-in-law, and son) in a home which is 1 story. There are not steps up to the entrance the patient uses. There is a ramp.      Patient's method of transportation is personal car.      The highest level of education was high school diploma.      The patient currently disabled.      Identified important Relationships are "My Family"       Pets : 7 pit bulls       Interests / Fun: Watching movies, playing cards       Current Stressors: "My children, I worry about their health issues."       Religious / Personal Beliefs: "Baptist, I believe in God."       L. Ducatte, RN, BSN           Objective:    Vitals: Ht 5\' 6"  (1.676 m)   Wt 182 lb 9.6 oz (82.8 kg) Comment: Patient weighed at home  LMP 11/08/1991   BMI 29.47 kg/m  Vitals are unable to obtained due to DJMEQ-68 public health emergency  Activities of Daily Living In your present state of health, do you have any difficulty performing the following activities: 02/19/2019 12/10/2018  Hearing? N N  Vision? Y N  Comment needs to pick up new glasses -  Difficulty concentrating or making decisions? Y N  Walking or climbing stairs? Y Y  Comment "bad knees and arthritis" -  Dressing or bathing? N N  Doing errands, shopping? N N  Some recent data might be hidden    Goals Goals    . Eat breakfast    . Weight (lb) < 169 lb (76.7 kg) (pt-stated)     7% weight loss       Fall Risk Fall Risk  02/19/2019 02/19/2019 12/10/2018 12/05/2018 05/04/2018  Falls in the past year? - 1 0 0 No  Number falls in past yr: - 0 - - -  Injury with Fall? - 0 - - -  Comment - Scracthed leg - - -  Risk for fall due to : - History of fall(s) - Other (Comment) -  Risk for fall due to: Comment - Tripped outside - pain in  hips to legs -  Follow up Education provided;Falls prevention discussed Education provided;Falls prevention discussed Falls prevention discussed Falls prevention discussed -  CDC Handout on Fall Prevention and Handout on Home Exercise Program, Access codes [email or web address] and [email or web address] given/mailed to patient with red exercise band.    Depression Screen PHQ 2/9 Scores 02/19/2019 12/10/2018 12/05/2018 05/04/2018  PHQ - 2 Score 5 4 4 6   PHQ- 9 Score 13 12 12 22   Exception Documentation - - - -  Encouraged patient to call [facility] Counselor  Cognitive Testing Six-Item Cognitive Screener   "I would like to ask you some questions that ask you to use your memory. I am going to name three objects. Please wait until I say all three words, then repeat them. Remember what they are  because I am going to ask you to name them again in a few  minutes. Please repeat these words for me: APPLE-TABLE-PENNY." (Interviewer may repeat names 3 times if necessary but repetition not scored.)  Did patient correctly repeat all three words? Yes - may proceed with screen  What year is this? Correct What month is this? Correct What day of the week is this? Correct  What were the three objects I asked you to remember? . Apple Correct . Table Unable to remember . Penny Correct  Score one point for each incorrect answer.  A score of 2 or more points warrants additional investigation.  Patient's score 1    Assessment and Plan:      PHQ-9 = 13. Encouraged patient to contact her [facility] counselor  Mammogram ordered  During the course of the visit the patient was educated and counseled about appropriate screening and preventive services as documented in the assessment and plan.  The printed AVS was given to the patient and included an updated screening schedule, a list of risk factors, and personalized health advice.        Velora Heckler, RN  02/19/2019

## 2019-02-19 NOTE — Progress Notes (Signed)
I discussed the AWV findings with the RN who conducted the visit. I was present in the office suite and immediately available to provide assistance and direction throughout the time the service was provided.  Molli Hazard A, DO 02/19/2019, 5:29 PM Pager: [phone]

## 2019-02-24 ENCOUNTER — Other Ambulatory Visit: Payer: Self-pay | Admitting: Internal Medicine

## 2019-02-24 DIAGNOSIS — M5416 Radiculopathy, lumbar region: Secondary | ICD-10-CM

## 2019-03-27 ENCOUNTER — Other Ambulatory Visit: Payer: Self-pay

## 2019-03-27 ENCOUNTER — Ambulatory Visit (INDEPENDENT_AMBULATORY_CARE_PROVIDER_SITE_OTHER): Payer: Medicare Other | Admitting: *Deleted

## 2019-03-27 DIAGNOSIS — Z23 Encounter for immunization: Secondary | ICD-10-CM | POA: Diagnosis not present

## 2019-04-17 ENCOUNTER — Encounter: Payer: Self-pay | Admitting: Internal Medicine

## 2019-04-17 ENCOUNTER — Telehealth: Payer: Self-pay | Admitting: *Deleted

## 2019-04-17 ENCOUNTER — Encounter: Payer: Medicare Other | Admitting: Internal Medicine

## 2019-04-17 NOTE — Progress Notes (Deleted)
   CC: ***  HPI:  [name] is a 63 y.o. with PMH as below.   Please see A&P for assessment of the patient's acute and chronic medical conditions.   Blood Pressure?  Hyperlipidemia Not taking pravastatin?***  - lipid profile  GERD Three family members with history of stomach cancer.    Depression PHQ-9 ***  - continue prozac 20 mg qd  Tobacco Use  Past Medical History:  Diagnosis Date  . Back pain 07/07/2009  . Bradycardia 05/15/2006  . Bursitis of right shoulder 05/10/2010  . Depression 05/15/2006  . Ecchymoses, spontaneous 08/12/2010  . Fatigue 08/12/2010  . GERD (gastroesophageal reflux disease) 05/15/2006  . Inguinal pain 03/23/2010   right  . Mole (skin) 07/07/2009  . Personal history of goiter 05/15/2006  . Shoulder pain, right 07/07/2009  . Smoker 05/10/2010  . Weight gain 08/12/2010   Review of Systems:  ***  Physical Exam:  Constitution: HENT: Eyes: Cardio: Respiratory: Abdominal: MSK: GU: Neuro: Skin:   There were no vitals filed for this visit. ***  Assessment & Plan:   See Encounters Tab for problem based charting.  Patient {GC/GE:3044014::"discussed with","seen with"} Dr. {NAMES:[id number]::"Butcher","Granfortuna","E. Hoffman","Klima","Mullen","Narendra","Raines","Vincent"}

## 2019-04-17 NOTE — Telephone Encounter (Signed)
Pt scheduled to see pcp today at 0915, but did not show up.  Attempted to reach out to patient to reschedule appt or possible tele health visit-no answer, message left on recorder for return call Despina Hidden Cassady9/23/202010:10 AM

## 2019-04-25 ENCOUNTER — Other Ambulatory Visit: Payer: Self-pay | Admitting: Internal Medicine

## 2019-04-25 DIAGNOSIS — K219 Gastro-esophageal reflux disease without esophagitis: Secondary | ICD-10-CM

## 2019-04-30 ENCOUNTER — Ambulatory Visit: Payer: Medicare Other

## 2019-05-08 ENCOUNTER — Encounter: Payer: Self-pay | Admitting: Internal Medicine

## 2019-05-08 ENCOUNTER — Ambulatory Visit (INDEPENDENT_AMBULATORY_CARE_PROVIDER_SITE_OTHER): Payer: Medicare Other | Admitting: Internal Medicine

## 2019-05-08 ENCOUNTER — Other Ambulatory Visit: Payer: Self-pay

## 2019-05-08 VITALS — BP 118/68 | HR 57 | Temp 98.2°F | Wt 188.2 lb

## 2019-05-08 DIAGNOSIS — M545 Low back pain, unspecified: Secondary | ICD-10-CM

## 2019-05-08 DIAGNOSIS — G5711 Meralgia paresthetica, right lower limb: Secondary | ICD-10-CM | POA: Insufficient documentation

## 2019-05-08 DIAGNOSIS — N3941 Urge incontinence: Secondary | ICD-10-CM | POA: Diagnosis not present

## 2019-05-08 DIAGNOSIS — R202 Paresthesia of skin: Secondary | ICD-10-CM

## 2019-05-08 DIAGNOSIS — L304 Erythema intertrigo: Secondary | ICD-10-CM

## 2019-05-08 DIAGNOSIS — B369 Superficial mycosis, unspecified: Secondary | ICD-10-CM

## 2019-05-08 DIAGNOSIS — F339 Major depressive disorder, recurrent, unspecified: Secondary | ICD-10-CM

## 2019-05-08 DIAGNOSIS — M5127 Other intervertebral disc displacement, lumbosacral region: Secondary | ICD-10-CM

## 2019-05-08 DIAGNOSIS — F3340 Major depressive disorder, recurrent, in remission, unspecified: Secondary | ICD-10-CM

## 2019-05-08 DIAGNOSIS — Z79899 Other long term (current) drug therapy: Secondary | ICD-10-CM

## 2019-05-08 HISTORY — DX: Meralgia paresthetica, right lower limb: G57.11

## 2019-05-08 MED ORDER — NYSTATIN 100000 UNIT/GM EX POWD: Freq: Three times a day (TID) | CUTANEOUS | 0 refills | Status: DC

## 2019-05-08 MED ORDER — FLUOXETINE HCL 20 MG PO CAPS: 20.0000 mg | ORAL_CAPSULE | Freq: Three times a day (TID) | ORAL | 0 refills | Status: DC

## 2019-05-08 NOTE — Assessment & Plan Note (Signed)
She has right leg tingling along the lateral leg for the past few months. She has no other numbness or tingling. Her gabapentin sometimes helps. She has gained some weight recently and often wears her belt very tight. Neurological exam benign for other concerning findings.   - advised wearing loose clothing - discussed important of weight loss  - cont. Gabapentin 300 mg tid

## 2019-05-08 NOTE — Progress Notes (Signed)
   CC: right leg paresthesia   HPI:  [name] is a 63 y.o. with PMH as below.   Please see A&P for assessment of the patient's acute and chronic medical conditions.    Past Medical History:  Diagnosis Date  . Back pain 07/07/2009  . Bradycardia 05/15/2006  . Bursitis of right shoulder 05/10/2010  . Depression 05/15/2006  . Ecchymoses, spontaneous 08/12/2010  . Fatigue 08/12/2010  . GERD (gastroesophageal reflux disease) 05/15/2006  . Inguinal pain 03/23/2010   right  . Mole (skin) 07/07/2009  . Personal history of goiter 05/15/2006  . Shoulder pain, right 07/07/2009  . Smoker 05/10/2010  . Weight gain 08/12/2010   Review of Systems:   Review of Systems  Respiratory: Negative for shortness of breath.   Cardiovascular: Negative for chest pain and leg swelling.  Genitourinary:       Incontinence with urge, sometimes randomly, denies feelings of incomplete emptying.   Musculoskeletal: Positive for back pain, joint pain and myalgias. Negative for falls.  Neurological: Positive for tingling, sensory change and weakness. Negative for focal weakness.   Physical Exam:  Constitution: NAD, obese  Cardio: rrr, no m/r/g, no le edema  MSK: lower leg flexion bilaterally 2/5, all other strength 5/5, unable to completely extend legs, abnormal gait  Neuro: sensation intact UE and LE, reflexes 2+ UE and LE Skin: c/d/i   Vitals:   05/08/19 1357  BP: 118/68  Pulse: (!) 57  SpO2: 100%  Weight: 188 lb 3.2 oz (85.4 kg)     Assessment & Plan:   See Encounters Tab for problem based charting.  Patient discussed with Dr. Evette Doffing

## 2019-05-08 NOTE — Assessment & Plan Note (Signed)
She has ongoing lumbar back pain in addition to weakness of knee flexion in her lower legs. She does have occasional incontinence and one episode of stool incontinence a month ago but no saddle anesthesia or other numbness except her right lateral thigh. She states she cannot stand for more than 10 minutes without needed to rest due to weakness. Previous MRI in 2013 demonstrated L5-S1 disc protrusion with mass effect of thecal sac and left S1 nerve root.   - reorder lumbar MRI

## 2019-05-08 NOTE — Assessment & Plan Note (Signed)
She states this medication continues to help her depression.   - refill prozac 20 mg tid

## 2019-05-08 NOTE — Patient Instructions (Signed)
Thank you for allowing [place] to provide your care today. Today we discussed your right leg numbness and lower back pain  I have ordered the following labs for you:  Basic metabolic panel    I will call if any are abnormal.    Today we made the following changes to your medications:   Please START taking  Nystatin powder - please apply to area of rash three times per day   I also think you have what is called paresthetica meralgia. I have included information for you.   I have ordered an MRI of your lower spine. You will be contacted to have this done.   Please follow-up if symptoms worsen or fail to improve.  Please call the internal medicine center clinic if you have any questions or concerns, we may be able to help and keep you from a long and expensive emergency room wait. Our clinic and after hours phone number is [phone], the best time to call is Monday through Friday 9 am to 4 pm but there is always someone available 24/7 if you have an emergency. If you need medication refills please notify your pharmacy one week in advance and they will send [place] a request.

## 2019-05-08 NOTE — Assessment & Plan Note (Signed)
Underneath her breasts bilaterally.   - nystatin powder tid - recommended aeration keeping area dry

## 2019-05-09 NOTE — Progress Notes (Signed)
Internal Medicine Clinic Attending  Case discussed with Dr. Seawell at the time of the visit.  We reviewed the resident's history and exam and pertinent patient test results.  I agree with the assessment, diagnosis, and plan of care documented in the resident's note.    

## 2019-05-29 ENCOUNTER — Other Ambulatory Visit: Payer: Self-pay | Admitting: Internal Medicine

## 2019-05-29 DIAGNOSIS — M545 Low back pain, unspecified: Secondary | ICD-10-CM

## 2019-05-30 ENCOUNTER — Other Ambulatory Visit: Payer: Self-pay | Admitting: Internal Medicine

## 2019-05-30 DIAGNOSIS — F3340 Major depressive disorder, recurrent, in remission, unspecified: Secondary | ICD-10-CM

## 2019-05-31 ENCOUNTER — Ambulatory Visit (HOSPITAL_COMMUNITY)
Admission: RE | Admit: 2019-05-31 | Discharge: 2019-05-31 | Disposition: A | Discharge status: Home / Self Care | Payer: Medicare Other | Source: Ambulatory Visit | Attending: Student in an Organized Health Care Education/Training Program | Admitting: Student in an Organized Health Care Education/Training Program

## 2019-05-31 ENCOUNTER — Other Ambulatory Visit: Payer: Self-pay

## 2019-05-31 DIAGNOSIS — M5126 Other intervertebral disc displacement, lumbar region: Secondary | ICD-10-CM | POA: Diagnosis not present

## 2019-05-31 DIAGNOSIS — M545 Low back pain, unspecified: Secondary | ICD-10-CM

## 2019-05-31 DIAGNOSIS — R202 Paresthesia of skin: Secondary | ICD-10-CM

## 2019-05-31 LAB — CREATININE, SERUM
Creatinine, Ser: 0.89 mg/dL (ref 0.44–1.00)
GFR calc Af Amer: >60 mL/min (ref >60)
GFR calc non Af Amer: >60 mL/min (ref >60)

## 2019-05-31 MED ORDER — GADOBUTROL 1 MMOL/ML IV SOLN
8.0000 mL | Freq: Once | INTRAVENOUS | Status: AC | PRN
  Administered 2019-05-31: 18:00:00 8 mL via INTRAVENOUS

## 2019-06-06 ENCOUNTER — Encounter: Payer: Self-pay | Admitting: Internal Medicine

## 2019-06-24 ENCOUNTER — Other Ambulatory Visit: Payer: Self-pay | Admitting: Internal Medicine

## 2019-06-24 DIAGNOSIS — M5416 Radiculopathy, lumbar region: Secondary | ICD-10-CM

## 2019-07-24 ENCOUNTER — Other Ambulatory Visit: Payer: Self-pay | Admitting: Internal Medicine

## 2019-07-24 DIAGNOSIS — K219 Gastro-esophageal reflux disease without esophagitis: Secondary | ICD-10-CM

## 2019-10-22 ENCOUNTER — Other Ambulatory Visit: Payer: Self-pay | Admitting: Internal Medicine

## 2019-10-22 DIAGNOSIS — M5416 Radiculopathy, lumbar region: Secondary | ICD-10-CM

## 2019-10-22 DIAGNOSIS — K219 Gastro-esophageal reflux disease without esophagitis: Secondary | ICD-10-CM

## 2019-11-25 ENCOUNTER — Encounter: Payer: Self-pay | Admitting: Internal Medicine

## 2019-11-25 ENCOUNTER — Ambulatory Visit (HOSPITAL_COMMUNITY)
Admission: RE | Admit: 2019-11-25 | Discharge: 2019-11-25 | Disposition: A | Discharge status: Home / Self Care | Payer: Medicare Other | Source: Ambulatory Visit | Attending: Internal Medicine | Admitting: Internal Medicine

## 2019-11-25 ENCOUNTER — Ambulatory Visit (INDEPENDENT_AMBULATORY_CARE_PROVIDER_SITE_OTHER): Payer: Medicare Other | Admitting: Internal Medicine

## 2019-11-25 ENCOUNTER — Other Ambulatory Visit: Payer: Self-pay

## 2019-11-25 VITALS — BP 133/69 | HR 52 | Temp 98.1°F | Ht 66.0 in | Wt 180.0 lb

## 2019-11-25 DIAGNOSIS — M25552 Pain in left hip: Secondary | ICD-10-CM | POA: Diagnosis not present

## 2019-11-25 HISTORY — DX: Pain in left hip: M25.552

## 2019-11-25 NOTE — Progress Notes (Signed)
   CC: Left sided hip pain  HPI:  [name] is a 64 y.o. with lumbar spine foraminal stenosis, allergic rhinitis, depression, meralgia paresthetica of right side who presents for pain on left side. Please see problem based charting for evaluation, assessment, and plan.  Past Medical History:  Diagnosis Date  . Back pain 07/07/2009  . Bradycardia 05/15/2006  . Bursitis of right shoulder 05/10/2010  . Depression 05/15/2006  . Ecchymoses, spontaneous 08/12/2010  . Fatigue 08/12/2010  . GERD (gastroesophageal reflux disease) 05/15/2006  . Inguinal pain 03/23/2010   right  . Mole (skin) 07/07/2009  . Personal history of goiter 05/15/2006  . Shoulder pain, right 07/07/2009  . Smoker 05/10/2010  . Weight gain 08/12/2010   Review of Systems:    Review of Systems  Constitutional: Negative for chills and fever.  Respiratory: Negative for shortness of breath.   Cardiovascular: Negative for chest pain.  Gastrointestinal: Negative for nausea and vomiting.  Neurological: Negative for dizziness and headaches.   Physical Exam:  Vitals:   11/25/19 1322  BP: 133/69  Pulse: (!) 52  Temp: 98.1 F (36.7 C)  TempSrc: Oral  SpO2: 96%  Weight: 180 lb (81.6 kg)  Height: 5\' 6"  (1.676 m)   Physical Exam  Constitutional: Appears well-developed and well-nourished. No distress.  HENT:  Head: Normocephalic and atraumatic.  Eyes: Conjunctivae are normal.  Cardiovascular: Normal rate, regular rhythm and normal heart sounds.  Respiratory: Effort normal and breath sounds normal. No respiratory distress. No wheezes.  GI: Soft. Bowel sounds are normal. No distension. There is no tenderness.  Musculoskeletal: No edema. 5/5 strength in bilateral upper and lower extremities. Tense muscle over left lateral pelvis Neurological: Is alert.  Skin: Not diaphoretic. No erythema.  Psychiatric: Normal mood and affect. Behavior is normal. Judgment and thought content normal.    Assessment & Plan:    See Encounters Tab for problem based charting.  Patient discussed with Dr. Heber [place]

## 2019-11-25 NOTE — Patient Instructions (Addendum)
It was a pleasure to see you today [name]. Please make the following changes:  -please use voltaren gel, tylenol, and ice for the pain  -I have ordered a left hip xray to make sure you do not have any fractures or displacement of that joing  If you have any questions or concerns, please call our clinic at [phone] between 9am-5pm and after hours call [phone] and ask for the internal medicine resident on call. If you feel you are having a medical emergency please call 911.   Thank you, we look forward to help you remain healthy!  Lars Mage, MD Internal Medicine PGY3   Hip Pain The hip is the joint between the upper legs and the lower pelvis. The bones, cartilage, tendons, and muscles of your hip joint support your body and allow you to move around. Hip pain can range from a minor ache to severe pain in one or both of your hips. The pain may be felt on the inside of the hip joint near the groin, or on the outside near the buttocks and upper thigh. You may also have swelling or stiffness in your hip area. Follow these instructions at home: Managing pain, stiffness, and swelling      If directed, put ice on the painful area. To do this: ? Put ice in a plastic bag. ? Place a towel between your skin and the bag. ? Leave the ice on for 20 minutes, 2-3 times a day.  If directed, apply heat to the affected area as often as told by your health care provider. Use the heat source that your health care provider recommends, such as a moist heat pack or a heating pad. ? Place a towel between your skin and the heat source. ? Leave the heat on for 20-30 minutes. ? Remove the heat if your skin turns bright red. This is especially important if you are unable to feel pain, heat, or cold. You may have a greater risk of getting burned. Activity  Do exercises as told by your health care provider.  Avoid activities that cause pain. General instructions   Take over-the-counter and  prescription medicines only as told by your health care provider.  Keep a journal of your symptoms. Write down: ? How often you have hip pain. ? The location of your pain. ? What the pain feels like. ? What makes the pain worse.  Sleep with a pillow between your legs on your most comfortable side.  Keep all follow-up visits as told by your health care provider. This is important. Contact a health care provider if:  You cannot put weight on your leg.  Your pain or swelling continues or gets worse after one week.  It gets harder to walk.  You have a fever. Get help right away if:  You fall.  You have a sudden increase in pain and swelling in your hip.  Your hip is red or swollen or very tender to touch. Summary  Hip pain can range from a minor ache to severe pain in one or both of your hips.  The pain may be felt on the inside of the hip joint near the groin, or on the outside near the buttocks and upper thigh.  Avoid activities that cause pain.  Write down how often you have hip pain, the location of the pain, what makes it worse, and what it feels like. This information is not intended to replace advice given to you by your health  care provider. Make sure you discuss any questions you have with your health care provider. Document Revised: 11/26/2018 Document Reviewed: 11/26/2018 Elsevier Patient Education  [place].

## 2019-11-25 NOTE — Assessment & Plan Note (Signed)
  Patient states that she has been having left hip pain for the past 4-5 days. Describes the pain as 7/10 intensity, sharp in nature, non-radiating. Patient states that she took 2 tylenols and that helped alleviate the pain somewhat. Patient has been helping move her husband who had a stroke.   Of note the patient states that she had a fall 1-61months prior when she tripped over something.   Assessment and plan Patient's pain is likely secondary to muscle spasm from assisting her husband. Will order pelvis xray to make sure no displacement or fracture. Given that patient has pain in several other joints (knee, right hip, back) it is possible that the pain maybe arthritic as well.   -voltaren gel  -acetaminophen  -pelvis xray  -return to clinic if pain worsens

## 2019-11-26 NOTE — Progress Notes (Signed)
Internal Medicine Clinic Attending  Case discussed with Dr. Chundi at the time of the visit.  We reviewed the resident's history and exam and pertinent patient test results.  I agree with the assessment, diagnosis, and plan of care documented in the resident's note. 

## 2019-12-25 ENCOUNTER — Encounter: Payer: Self-pay | Admitting: Internal Medicine

## 2019-12-25 ENCOUNTER — Encounter: Payer: Medicare Other | Admitting: Internal Medicine

## 2019-12-25 ENCOUNTER — Telehealth: Payer: Self-pay

## 2019-12-25 NOTE — Progress Notes (Deleted)
   CC: ***  HPI:  Ms.Jakeia F Kinley is a 64 y.o. with PMH as below.   Please see A&P for assessment of the patient's acute and chronic medical conditions.    Healthcare Maintenance  - Dexa Scan - has/has not gotten covid-19 vaccine   Past Medical History:  Diagnosis Date  . Back pain 07/07/2009  . Bradycardia 05/15/2006  . Bursitis of right shoulder 05/10/2010  . Depression 05/15/2006  . Ecchymoses, spontaneous 08/12/2010  . Fatigue 08/12/2010  . GERD (gastroesophageal reflux disease) 05/15/2006  . Inguinal pain 03/23/2010   right  . Mole (skin) 07/07/2009  . Personal history of goiter 05/15/2006  . Shoulder pain, right 07/07/2009  . Smoker 05/10/2010  . Weight gain 08/12/2010   Review of Systems:  *** 10 point ROS negative except as noted in HPI  Physical Exam:   Constitution: NAD, appears stated age HENT: Eyes:  Cardio: RRR, no m/r/g, no LE edema  Respiratory: CTA, no w/r/r Abdominal: NTTP, soft, non-distended MSK: moving all extremities Neuro: normal affect, a&ox3 GU: Skin: c/d/i    There were no vitals filed for this visit. ***  Assessment & Plan:   See Encounters Tab for problem based charting.  Patient {GC/GE:3044014::"discussed with","seen with"} Dr. {NAMES:3044014::"Butcher","Granfortuna","E. Hoffman","Klima","Mullen","Narendra","Raines","Vincent"}

## 2019-12-25 NOTE — Telephone Encounter (Signed)
Patient was a no-show for her appointment today.  TC placed to patient, pt states "I'm doing fine and will call back to reschedule the appointment". SChaplin, RN,BSN

## 2020-01-10 ENCOUNTER — Other Ambulatory Visit: Payer: Self-pay | Admitting: Internal Medicine

## 2020-01-10 DIAGNOSIS — K219 Gastro-esophageal reflux disease without esophagitis: Secondary | ICD-10-CM

## 2020-02-19 ENCOUNTER — Other Ambulatory Visit: Payer: Self-pay | Admitting: Internal Medicine

## 2020-02-19 DIAGNOSIS — M5416 Radiculopathy, lumbar region: Secondary | ICD-10-CM

## 2020-07-03 ENCOUNTER — Encounter: Payer: Self-pay | Admitting: *Deleted

## 2020-07-03 NOTE — Progress Notes (Signed)

## 2020-07-06 NOTE — Progress Notes (Signed)
Things That May Be Affecting Your Health:  Alcohol  Hearing loss x Pain   x Depression  Home Safety  Sexual Health   Diabetes  Lack of physical activity  Stress   Difficulty with daily activities  Loneliness  Tiredness   Drug use  Medicines x Tobacco use   Falls  Motor Vehicle Safety  Weight   Food choices  Oral Health  Other    YOUR PERSONALIZED HEALTH PLAN : 1. Schedule your next subsequent Medicare Wellness visit in one year 2. Attend all of your regular appointments to address your medical issues 3. Complete the preventative screenings and services   Annual Wellness Visit   Medicare Covered Preventative Screenings and [place] Men and Women Who How Often Need? Date of Last Service Action  Abdominal Aortic Aneurysm Adults with AAA risk factors Once     Alcohol Misuse and Counseling All Adults Screening once a year if no alcohol misuse. Counseling up to 4 face to face sessions.     Bone Density Measurement  Adults at risk for osteoporosis Once every 2 yrs x    Lipid Panel Z13.6 All adults without CV disease Once every 5 yrs x    Colorectal Cancer   Stool sample or  Colonoscopy All adults 64 and older   Once every year  Every 10 years     Depression All Adults Once a year x Today   Diabetes Screening Blood glucose, post glucose load, or GTT Z13.1  All adults at risk  Pre-diabetics  Once per year  Twice per year     Diabetes  Self-Management Training All adults Diabetics 10 hrs first year; 2 hours subsequent years. Requires Copay     Glaucoma  Diabetics  Family history of glaucoma  African Americans 45 yrs +  Hispanic Americans 1 yrs + Annually - requires coppay     Hepatitis C Z72.89 or F19.20  High Risk for HCV  Born between 1945 and 1965  Annually  Once     HIV Z11.4 All adults based on risk  Annually btw ages 30 & 51 regardless of risk  Annually > 65 yrs if at increased risk     Lung Cancer Screening Asymptomatic adults  aged 83-77 with 30 pack yr history and current smoker OR quit within the last 15 yrs Annually Must have counseling and shared decision making documentation before first screen X - unclear if she still smokes    Medical Nutrition Therapy Adults with   Diabetes  Renal disease  Kidney transplant within past 3 yrs 3 hours first year; 2 hours subsequent years     Obesity and Counseling All adults Screening once a year Counseling if BMI 30 or higher  Today   Tobacco Use Counseling Adults who use tobacco  Up to 8 visits in one year x    Vaccines Z23  Hepatitis B  Influenza   Pneumonia  Adults   Once  Once every flu season  Two different vaccines separated by one year x    Next Annual Wellness Visit People with Medicare Every year  Today     Services & Screenings Women Who How Often Need  Date of Last Service Action  Mammogram  Z12.31 Women over 74 One baseline ages 54-39. Annually ager 40 yrs+ x    Pap tests All women Annually if high risk. Every 2 yrs for normal risk women     Screening for cervical cancer with   Pap (  Z01.419 nl or Z01.411abnl) &  HPV Z11.51 Women aged 85 to 50 Once every 5 yrs     Screening pelvic and breast exams All women Annually if high risk. Every 2 yrs for normal risk women     Sexually Transmitted Diseases  Chlamydia  Gonorrhea  Syphilis All at risk adults Annually for non pregnant females at increased risk         [place] Men Who How Ofter Need  Date of Last Service Action  Prostate Cancer - DRE & PSA Men over 50 Annually.  DRE might require a copay.     Sexually Transmitted Diseases  Syphilis All at risk adults Annually for men at increased risk

## 2020-07-18 ENCOUNTER — Other Ambulatory Visit: Payer: Self-pay | Admitting: Student

## 2020-07-18 ENCOUNTER — Other Ambulatory Visit: Payer: Self-pay | Admitting: Internal Medicine

## 2020-07-18 DIAGNOSIS — K219 Gastro-esophageal reflux disease without esophagitis: Secondary | ICD-10-CM

## 2020-07-18 DIAGNOSIS — M5416 Radiculopathy, lumbar region: Secondary | ICD-10-CM

## 2020-09-07 ENCOUNTER — Encounter: Payer: Medicare Other | Admitting: Student

## 2020-11-15 ENCOUNTER — Other Ambulatory Visit: Payer: Self-pay | Admitting: Internal Medicine

## 2020-11-15 DIAGNOSIS — M5416 Radiculopathy, lumbar region: Secondary | ICD-10-CM

## 2020-11-16 NOTE — Telephone Encounter (Signed)
Last appt 11/2019. Called pt to schedule an appt - she's agreeable.Call transferred to front office. Appt scheduled 5/4 with Dr Sherry Ruffing.

## 2020-11-25 ENCOUNTER — Ambulatory Visit (INDEPENDENT_AMBULATORY_CARE_PROVIDER_SITE_OTHER): Payer: Medicare Other | Admitting: Internal Medicine

## 2020-11-25 VITALS — BP 114/75 | HR 53 | Wt 155.0 lb

## 2020-11-25 DIAGNOSIS — Z0000 Encounter for general adult medical examination without abnormal findings: Secondary | ICD-10-CM

## 2020-11-25 DIAGNOSIS — M79641 Pain in right hand: Secondary | ICD-10-CM | POA: Insufficient documentation

## 2020-11-25 DIAGNOSIS — K219 Gastro-esophageal reflux disease without esophagitis: Secondary | ICD-10-CM

## 2020-11-25 DIAGNOSIS — Z1231 Encounter for screening mammogram for malignant neoplasm of breast: Secondary | ICD-10-CM

## 2020-11-25 DIAGNOSIS — F3340 Major depressive disorder, recurrent, in remission, unspecified: Secondary | ICD-10-CM

## 2020-11-25 DIAGNOSIS — E785 Hyperlipidemia, unspecified: Secondary | ICD-10-CM | POA: Diagnosis not present

## 2020-11-25 DIAGNOSIS — Z13820 Encounter for screening for osteoporosis: Secondary | ICD-10-CM | POA: Diagnosis not present

## 2020-11-25 HISTORY — DX: Pain in right hand: M79.641

## 2020-11-25 NOTE — Assessment & Plan Note (Signed)
Patient states that [name] has been having right hand pain for a few years now, located on the dorsal aspect of the hand, no pain in joints or fingers.  States that it seems to have been worsening, [name] states that a few months ago [name] had an accident where a trash can fell on top of her hand and it became swollen at that time.  [name] also endorses some mild morning stiffness that improves when [name] is using it. Using an arthritis glove helps with the pain.  Also states that [name] has had difficulty opening jars.  States that gabapentin does not help.  Has not tried any over-the-counter medications.  [name] denies any numbness, tingling, or weakness.  [name] does not have any pain at this time.  On exam there are no deformities, swelling, erythema, or lesions noted, no enlarged small joints, strength extension, grip strength, and sensation to light touch intact. This may be related to her previous injury however given the stiffness noted and improvement with arthritis glove there could also be a component of osteoarthritis. Since sensation and strength are intact will hold off on imaging at this time.  -Advised to try using voltaren gel

## 2020-11-25 NOTE — Assessment & Plan Note (Signed)
Patient reports that she is following with behavioral health for her depression.  She is currently on fluoxetine 80 mg daily.  We will update her medication list.

## 2020-11-25 NOTE — Assessment & Plan Note (Addendum)
Patient had history of hyperlipidemia and last lipid panel in 06/2015 showed LDL 137, cholesterol 241, and HDL 71.  Her ASCVD risk score of 10.4 at that time.  She had previously been on pravastatin however has not taken this medication for few years now.  We will repeat a lipid panel to assess need for restarting statin.  Addendum: Cholesterol 233, LDL 144. ASCVD risk of 14, intermediate. Contacted patient and discussed restarting statin medication which she was agreeable with. Restart pravastatin 40 mg daily.

## 2020-11-25 NOTE — Patient Instructions (Addendum)
[name],  It was a pleasure to see you today. Thank you for coming in.   Today we discussed your hand pain, you can start using the voltaren gel on the area to see if this helps.   We also discussed your acid reflux symptoms. We had discussed referring you to GI for further evaluation but you were not interested. Please continue to monitor your symptoms and contact us if they get worse. I have provided some information regarding GERD.    I am checking some labs today and have ordered some screening tests to get done.   Please return to clinic in [place] hand pain,ths or sooner if needed.   Thank you again for coming in.   Lonia Skinner M.D.  start trying your hand painI am checking some labs today

## 2020-11-25 NOTE — Progress Notes (Signed)
   CC: Right hand pain and routine follow up  HPI:  [name] is a 65 y.o. with a history of lumbar spine foraminal stenosis, allergic rhinitis, depression, meralgia paresthetica of the right side who presented with right hand pain and routine follow-up.  Past Medical History:  Diagnosis Date  . Back pain 07/07/2009  . Bradycardia 05/15/2006  . Bursitis of right shoulder 05/10/2010  . Depression 05/15/2006  . Ecchymoses, spontaneous 08/12/2010  . Fatigue 08/12/2010  . GERD (gastroesophageal reflux disease) 05/15/2006  . Inguinal pain 03/23/2010   right  . Mole (skin) 07/07/2009  . Personal history of goiter 05/15/2006  . Shoulder pain, right 07/07/2009  . Smoker 05/10/2010  . Weight gain 08/12/2010   Review of Systems:   Constitutional: Negative for chills and fever.  Respiratory: Negative for shortness of breath.   Cardiovascular: Negative for chest pain and leg swelling.  Gastrointestinal: Negative for abdominal pain, nausea and vomiting.  Neurological: Negative for dizziness and headaches.   Physical Exam:  Vitals:   11/25/20 1326  BP: 114/75  Pulse: (!) 53  SpO2: 99%  Weight: 155 lb (70.3 kg)   General: Middle-aged female, no acute distress, sitting in chair Cardiac: Regular rate and rhythm, no murmurs rubs or gallops Pulmonary: CTA BL, no wheezing, rhonchi, rales Abdomen: Soft, nontender, nondistended Extremity: Right hand tenderness to palpation over the dorsal aspect or joints, no erythema, edema, rash, or deformity noted Neuro: 5 out of 5 strength in right finger extension and grip strength, sensation to light touch intact   Assessment & Plan:   See Encounters Tab for problem based charting.  Patient discussed with Dr. Dareen Piano

## 2020-11-25 NOTE — Assessment & Plan Note (Signed)
Patient states that she has been using omeprazole 20 mg twice a day and states that she continues to have reflux symptoms, states that when she is laying down or snacking, she will suddenly have heartburn.  She initially reported that she felt that it was becoming more frequent however and stated that after her dietary changes with decreasing snacking and spicy foods and in addition to weight loss she thinks it is actually better.  She also endorses a weight loss of 25 pounds over the past year, states that she has been trying to lose weight.  Denies any nausea, vomiting, abdominal pain, diarrhea, or constipation. She reports smoking about 3 cigarettes/day, she is not interested in quitting at this time.  Given her weight loss in persistent symptoms of GERD we discussed referring to GI for a EGD to assess for other possible causes. She reports that she has had an EGD in the past when she started having symptoms, does not know what the results were or when or where she had done, stated that it was long time ago.  Contact us if she is able to find this information.  She stated that she did not want to be referred at this time and that she feels like she is actually getting better. -Continue omeprazole 20 mg twice daily -Advised patient to contact us if she changes her mind about referral

## 2020-11-25 NOTE — Assessment & Plan Note (Signed)
Ordered mammogram and DEXA scan.

## 2020-11-26 ENCOUNTER — Telehealth: Payer: Self-pay

## 2020-11-26 LAB — LIPID PANEL
Chol/HDL Ratio: 3.4 ratio (ref 0.0–4.4)
Cholesterol, Total: 233 mg/dL — ABNORMAL HIGH (ref 100–199)
HDL: 69 mg/dL (ref >39)
LDL Chol Calc (NIH): 144 mg/dL — ABNORMAL HIGH (ref 0–99)
Triglycerides: 112 mg/dL (ref 0–149)
VLDL Cholesterol Cal: 20 mg/dL (ref 5–40)

## 2020-11-26 NOTE — Telephone Encounter (Signed)
Pt missed a phone please return call [phone]

## 2020-11-26 NOTE — Progress Notes (Signed)
Internal Medicine Clinic Attending ° °Case discussed with Dr. Krienke  At the time of the visit.  We reviewed the resident’s history and exam and pertinent patient test results.  I agree with the assessment, diagnosis, and plan of care documented in the resident’s note.  °

## 2020-11-26 NOTE — Telephone Encounter (Signed)
Return pt's call - unsure who called pt. Pt stated she missed a call from [facility] outpatient. I will send message to Dr Sherry Ruffing who saw pt yesterday.

## 2020-11-27 ENCOUNTER — Encounter: Payer: Self-pay | Admitting: Internal Medicine

## 2020-12-01 MED ORDER — PRAVASTATIN SODIUM 40 MG PO TABS: 40.0000 mg | ORAL_TABLET | Freq: Every evening | ORAL | 3 refills | Status: DC

## 2020-12-01 NOTE — Addendum Note (Signed)
Addended by: Asencion Noble on: 12/01/2020 10:14 AM   Modules accepted: Orders

## 2021-01-07 ENCOUNTER — Encounter: Payer: Self-pay | Admitting: *Deleted

## 2021-01-14 ENCOUNTER — Other Ambulatory Visit: Payer: Self-pay | Admitting: Internal Medicine

## 2021-01-14 DIAGNOSIS — K219 Gastro-esophageal reflux disease without esophagitis: Secondary | ICD-10-CM

## 2021-04-14 ENCOUNTER — Other Ambulatory Visit: Payer: Self-pay | Admitting: Student

## 2021-04-14 DIAGNOSIS — M5416 Radiculopathy, lumbar region: Secondary | ICD-10-CM

## 2021-05-10 ENCOUNTER — Encounter: Payer: Medicare Other | Admitting: Internal Medicine

## 2021-05-13 ENCOUNTER — Ambulatory Visit (INDEPENDENT_AMBULATORY_CARE_PROVIDER_SITE_OTHER): Payer: Medicare Other | Admitting: Internal Medicine

## 2021-05-13 ENCOUNTER — Encounter: Payer: Self-pay | Admitting: Internal Medicine

## 2021-05-13 ENCOUNTER — Other Ambulatory Visit: Payer: Self-pay

## 2021-05-13 DIAGNOSIS — J309 Allergic rhinitis, unspecified: Secondary | ICD-10-CM

## 2021-05-13 DIAGNOSIS — E782 Mixed hyperlipidemia: Secondary | ICD-10-CM | POA: Diagnosis not present

## 2021-05-13 DIAGNOSIS — K219 Gastro-esophageal reflux disease without esophagitis: Secondary | ICD-10-CM

## 2021-05-13 MED ORDER — OMEPRAZOLE 20 MG PO CPDR: 40.0000 mg | DELAYED_RELEASE_CAPSULE | Freq: Every day | ORAL | 2 refills | Status: DC

## 2021-05-13 NOTE — Progress Notes (Signed)
  CC: Routine health visit  HPI:  [name] is a 65 y.o. female with a past medical history stated below and presents today for routine health visit. Please see problem based assessment and plan for additional details.  Past Medical History:  Diagnosis Date   Back pain 07/07/2009   Bradycardia 05/15/2006   Bursitis of right shoulder 05/10/2010   Depression 05/15/2006   Ecchymoses, spontaneous 08/12/2010   Fatigue 08/12/2010   GERD (gastroesophageal reflux disease) 05/15/2006   Inguinal pain 03/23/2010   right   Mole (skin) 07/07/2009   Personal history of goiter 05/15/2006   Shoulder pain, right 07/07/2009   Smoker 05/10/2010   Weight gain 08/12/2010    Current Outpatient Medications on File Prior to Visit  Medication Sig Dispense Refill   acetaminophen (TYLENOL) 500 MG tablet Take 1 tablet (500 mg total) by mouth every 6 (six) hours as needed. 30 tablet 5   cholecalciferol (VITAMIN D) 1000 units tablet Take 1 tablet (1,000 Units total) by mouth daily. 100 tablet 2   FLUoxetine (PROZAC) 40 MG capsule Take 80 mg by mouth daily.     gabapentin (NEURONTIN) 300 MG capsule TAKE 2 CAPSULES(600 MG) BY MOUTH THREE TIMES DAILY 180 capsule 3   Multiple Vitamin (MULTIVITAMIN WITH MINERALS) TABS Take 1 tablet by mouth daily.     omeprazole (PRILOSEC) 20 MG capsule TAKE 1 CAPSULE(20 MG) BY MOUTH TWICE DAILY 180 capsule 1   pravastatin (PRAVACHOL) 40 MG tablet Take 1 tablet (40 mg total) by mouth every evening. 90 tablet 3   No current facility-administered medications on file prior to visit.    Family History  Problem Relation Age of Onset   Diabetes Mother    Cancer Mother        Stomach   Diabetes Father    Kidney disease Father    Cancer Sister        Stomach   Stroke Brother    Cancer Brother        Stomach   Mental illness Sister    Cancer Daughter        skin   Review of Systems: ROS negative except for what is noted on the assessment and plan.  Vitals:    05/13/21 1046  BP: (!) 99/58  Pulse: (!) 51  Temp: 98 F (36.7 C)  TempSrc: Oral  SpO2: 98%  Weight: 139 lb (63 kg)  Height: 5\' 6"  (1.676 m)     Physical Exam: General: Well appearing well-nourished African-American female, NAD HENT: normocephalic, atraumatic, MMM EYES: conjunctiva non-erythematous, no scleral icterus CV: regular rate, normal rhythm, no murmurs, rubs, gallops.  No lower extremity edema.  No chest tenderness to palpation. Pulmonary: normal work of breathing on RA, lungs clear to auscultation, no rales, wheezes, rhonchi Abdominal: non-distended, soft, non-tender to palpation, normal BS Skin: Warm and dry, no rashes or lesions Neurological: MS: awake, alert and oriented x3, normal speech and fund of knowledge Motor: moves all extremities antigravity Psych: normal affect    Assessment & Plan:   See Encounters Tab for problem based charting.  Patient seen with Dr. Lonzo Cloud, M.D. [facility], PGY-1 Pager: [phone] Date 05/13/2021 Time 11:23 AM

## 2021-05-13 NOTE — Assessment & Plan Note (Addendum)
Patient reports to [facility] for routine health visit.  Her main concern is her worsening GERD symptoms.  She reports the symptoms have worsened since she was last seen May 2022.  She reports pain in chest and throat, burping which is foul-smelling which has caused her some embarrassment.  She endorses adherence to omeprazole 20 mg twice daily.  She reports her weight loss has been intentional, she has been actively cutting back on snacks, sweets, soda and she has had great results and is pleased with this.  She reports intermittent symptoms throughout the day though worse after she has a meal, especially if she lies down after meals.  She has not identified certain foods that worsen her symptoms.  We discussed which foods could worsen her GERD symptoms.  Likely this is an acute flare of her chronic GERD symptoms.  However, patient is a tobacco user and has had weight loss though intentional, and therefore we do want to continue to monitor.  We will increase omeprazole dose and have patient follow-up in 8 weeks.  If patient has not had improvement in symptoms or reports red flag symptoms we can refer to GI for EGD.  Patient reports having had EGD several years ago here at [facility] however those records are not available and she is unsure what EGD showed.  Plan: -Increase omeprazole to 40 mg twice a day -Follow-up in 8 weeks -If patient continues to have symptoms will refer to GI for EGD

## 2021-05-13 NOTE — Patient Instructions (Signed)
Thank you, [name] for allowing [place] to provide your care today. Today we discussed:   Acid Reflux: We increased your dose of omeprazole to 40mg  twice a day. We will see how you do on the higher dose for 8 weeks and then have you come back to re-evaluate your symptoms.   Allergies: We discussed starting an allergy medication for your congestion. We discussed trying Claritin, Allegra, or Zyrtec as well as using the Fluticasone and saline nasal spray.  My Chart Access: [email or web address]  Please follow-up in 2 months to re-address your heart burn symptoms.  Please make sure to arrive 15 minutes prior to your next appointment. If you arrive late, you may be asked to reschedule.    We look forward to seeing you next time. Please call our clinic at [phone] if you have any questions or concerns. The best time to call is Monday-Friday from 9am-4pm, but there is someone available 24/7. If after hours or the weekend, call the main hospital number and ask for the Internal Medicine Resident On-Call. If you need medication refills, please notify your pharmacy one week in advance and they will send [place] a request.   Thank you for letting us take part in your care. Wishing you the best!  Wayland Denis, MD 05/13/2021, 11:27 AM IM Resident, PGY-1

## 2021-05-13 NOTE — Assessment & Plan Note (Addendum)
Patient was restarted on statin in May 2022 after lipid panel showed cholesterol 233, LDL 144.  She reports no side effects and is tolerating the medication well.

## 2021-05-13 NOTE — Assessment & Plan Note (Signed)
Patient reports worsening congestion.  She believes her allergies have worsened.  We discussed allergy medications that could be taken daily to prevent symptoms such as second-generation antihistamines.  We also discussed using her Flonase regularly or saline spray.

## 2021-05-13 NOTE — Progress Notes (Deleted)
  CC: Routine health visit  HPI:  [name] is a 65 y.o. female with a past medical history stated below and presents today for routine health visit. Please see problem based assessment and plan for additional details.  Past Medical History:  Diagnosis Date   Back pain 07/07/2009   Bradycardia 05/15/2006   Bursitis of right shoulder 05/10/2010   Depression 05/15/2006   Ecchymoses, spontaneous 08/12/2010   Fatigue 08/12/2010   GERD (gastroesophageal reflux disease) 05/15/2006   Inguinal pain 03/23/2010   right   Mole (skin) 07/07/2009   Personal history of goiter 05/15/2006   Shoulder pain, right 07/07/2009   Smoker 05/10/2010   Weight gain 08/12/2010    Current Outpatient Medications on File Prior to Visit  Medication Sig Dispense Refill   acetaminophen (TYLENOL) 500 MG tablet Take 1 tablet (500 mg total) by mouth every 6 (six) hours as needed. 30 tablet 5   cholecalciferol (VITAMIN D) 1000 units tablet Take 1 tablet (1,000 Units total) by mouth daily. 100 tablet 2   FLUoxetine (PROZAC) 40 MG capsule Take 80 mg by mouth daily.     gabapentin (NEURONTIN) 300 MG capsule TAKE 2 CAPSULES(600 MG) BY MOUTH THREE TIMES DAILY 180 capsule 3   Multiple Vitamin (MULTIVITAMIN WITH MINERALS) TABS Take 1 tablet by mouth daily.     pravastatin (PRAVACHOL) 40 MG tablet Take 1 tablet (40 mg total) by mouth every evening. 90 tablet 3   No current facility-administered medications on file prior to visit.    Family History  Problem Relation Age of Onset   Diabetes Mother    Cancer Mother        Stomach   Diabetes Father    Kidney disease Father    Cancer Sister        Stomach   Stroke Brother    Cancer Brother        Stomach   Mental illness Sister    Cancer Daughter        skin    Review of Systems: ROS negative except for what is noted on the assessment and plan.  Vitals:   05/13/21 1046  BP: (!) 99/58  Pulse: (!) 51  Temp: 98 F (36.7 C)  TempSrc: Oral  SpO2: 98%   Weight: 139 lb (63 kg)  Height: 5\' 6"  (1.676 m)     Physical Exam: General: Well appearing well-nourished African-American female, NAD HENT: normocephalic, atraumatic, MMM EYES: conjunctiva non-erythematous, no scleral icterus CV: regular rate, normal rhythm, no murmurs, rubs, gallops.  No chest tenderness to palpation.  No lower extremity edema. Pulmonary: normal work of breathing on RA, lungs clear to auscultation, no rales, wheezes, rhonchi Abdominal: non-distended, soft, non-tender to palpation, normal BS Skin: Warm and dry, no rashes or lesions Neurological: MS: awake, alert and oriented x3, normal speech and fund of knowledge Motor: moves all extremities antigravity Psych: normal affect    Assessment & Plan:   See Encounters Tab for problem based charting.  Patient seen with Dr. Lonzo Cloud, M.D. [facility], PGY-1 Pager: [phone] Date 05/13/2021 Time 11:40 AM

## 2021-05-14 NOTE — Progress Notes (Signed)
Internal Medicine Clinic Attending ° °I saw and evaluated the patient.  I personally confirmed the key portions of the history and exam documented by Dr. Zinoviev and I reviewed pertinent patient test results.  The assessment, diagnosis, and plan were formulated together and I agree with the documentation in the resident’s note.  °

## 2021-06-23 ENCOUNTER — Ambulatory Visit: Payer: Medicare Other

## 2021-06-23 ENCOUNTER — Other Ambulatory Visit: Payer: Medicare Other

## 2021-07-05 ENCOUNTER — Other Ambulatory Visit: Payer: Self-pay

## 2021-07-05 DIAGNOSIS — K219 Gastro-esophageal reflux disease without esophagitis: Secondary | ICD-10-CM

## 2021-07-06 MED ORDER — OMEPRAZOLE 20 MG PO CPDR: 40.0000 mg | DELAYED_RELEASE_CAPSULE | Freq: Every day | ORAL | 2 refills | Status: DC

## 2021-09-13 ENCOUNTER — Other Ambulatory Visit: Payer: Self-pay

## 2021-09-13 DIAGNOSIS — M5416 Radiculopathy, lumbar region: Secondary | ICD-10-CM

## 2021-09-15 MED ORDER — GABAPENTIN 300 MG PO CAPS: ORAL_CAPSULE | ORAL | 1 refills | Status: DC

## 2021-10-06 ENCOUNTER — Encounter: Payer: Medicare Other | Admitting: Student

## 2021-11-02 ENCOUNTER — Ambulatory Visit (INDEPENDENT_AMBULATORY_CARE_PROVIDER_SITE_OTHER): Payer: Medicare Other | Admitting: Internal Medicine

## 2021-11-02 VITALS — BP 105/57 | HR 58 | Temp 98.3°F | Ht 66.0 in | Wt 135.9 lb

## 2021-11-02 DIAGNOSIS — G5711 Meralgia paresthetica, right lower limb: Secondary | ICD-10-CM | POA: Diagnosis not present

## 2021-11-02 DIAGNOSIS — Z23 Encounter for immunization: Secondary | ICD-10-CM | POA: Diagnosis not present

## 2021-11-02 DIAGNOSIS — K219 Gastro-esophageal reflux disease without esophagitis: Secondary | ICD-10-CM | POA: Diagnosis not present

## 2021-11-02 DIAGNOSIS — Z0000 Encounter for general adult medical examination without abnormal findings: Secondary | ICD-10-CM

## 2021-11-02 DIAGNOSIS — M545 Low back pain, unspecified: Secondary | ICD-10-CM

## 2021-11-02 DIAGNOSIS — E559 Vitamin D deficiency, unspecified: Secondary | ICD-10-CM

## 2021-11-02 DIAGNOSIS — M48061 Spinal stenosis, lumbar region without neurogenic claudication: Secondary | ICD-10-CM

## 2021-11-02 NOTE — Progress Notes (Signed)
? ? ?Subjective:  ?CC: GERD ? ?HPI: ? ?[name] is a 66 y.o. female with a past medical history stated below and presents today for GERD. Please see problem based assessment and plan for additional details. ? ? ?Past Medical History:  ?Diagnosis Date  ? Back pain 07/07/2009  ? Bradycardia 05/15/2006  ? Bursitis of right shoulder 05/10/2010  ? Depression 05/15/2006  ? Ecchymoses, spontaneous 08/12/2010  ? Fatigue 08/12/2010  ? GERD (gastroesophageal reflux disease) 05/15/2006  ? Inguinal pain 03/23/2010  ? right  ? Mole (skin) 07/07/2009  ? Personal history of goiter 05/15/2006  ? Shoulder pain, right 07/07/2009  ? Smoker 05/10/2010  ? Weight gain 08/12/2010  ? ? ?Current Outpatient Medications on File Prior to Visit  ?Medication Sig Dispense Refill  ? acetaminophen (TYLENOL) 500 MG tablet Take 1 tablet (500 mg total) by mouth every 6 (six) hours as needed. 30 tablet 5  ? cholecalciferol (VITAMIN D) 1000 units tablet Take 1 tablet (1,000 Units total) by mouth daily. 100 tablet 2  ? FLUoxetine (PROZAC) 40 MG capsule Take 80 mg by mouth daily.    ? gabapentin (NEURONTIN) 300 MG capsule TAKE 2 CAPSULES(600 MG) BY MOUTH THREE TIMES DAILY ?Strength: 300 mg 180 capsule 1  ? Multiple Vitamin (MULTIVITAMIN WITH MINERALS) TABS Take 1 tablet by mouth daily.    ? omeprazole (PRILOSEC) 20 MG capsule Take 2 capsules (40 mg total) by mouth daily. 180 capsule 2  ? pravastatin (PRAVACHOL) 40 MG tablet Take 1 tablet (40 mg total) by mouth every evening. 90 tablet 3  ? ?No current facility-administered medications on file prior to visit.  ? ? ?Family History  ?Problem Relation Age of Onset  ? Diabetes Mother   ? Cancer Mother   ?     Stomach  ? Diabetes Father   ? Kidney disease Father   ? Cancer Sister   ?     Stomach  ? Stroke Brother   ? Cancer Brother   ?     Stomach  ? Mental illness Sister   ? Cancer Daughter   ?     skin  ? ? ?Social History  ? ?Socioeconomic History  ? Marital status: Legally Separated  ?  Spouse  name: Not on file  ? Number of children: Not on file  ? Years of education: Not on file  ? Highest education level: Not on file  ?Occupational History  ? Not on file  ?Tobacco Use  ? Smoking status: Some Days  ?  Packs/day: 0.10  ?  Types: Cigarettes  ?  Last attempt to quit: 05/25/2006  ?  Years since quitting: 15.4  ? Smokeless tobacco: Never  ? Tobacco comments:  ?  1-2 cigarettes per day  ?Vaping Use  ? Vaping Use: Never used  ?Substance and Sexual Activity  ? Alcohol use: Yes  ?  Alcohol/week: 0.0 standard drinks  ?  Comment: Socially.  ? Drug use: Yes  ?  Frequency: 1.0 times per week  ?  Types: Marijuana  ?  Comment: "Helps calm me down"  ? Sexual activity: Yes  ?  Birth control/protection: Condom  ?Other Topics Concern  ? Not on file  ?Social History Narrative  ? Current Social History 02/19/2019    ?   ? Patient lives with family (Daughter, son-in-law, and son) in a home which is 1 story. There are not steps up to the entrance the patient uses. There is a ramp.  ?   ?  Patient's method of transportation is personal car.  ?   ? The highest level of education was high school diploma.  ?   ? The patient currently disabled.  ?   ? Identified important Relationships are "My Family"   ?   ? Pets : 7 pit bulls  ?    ? Interests / Fun: Watching movies, playing cards   ?   ? Current Stressors: "My children, I worry about their health issues."   ?   ? Religious / Personal Beliefs: "Baptist, I believe in God."   ?   ? L. Silvano Rusk, RN, BSN   ? ?Social Determinants of Health  ? ?Financial Resource Strain: Not on file  ?Food Insecurity: Not on file  ?Transportation Needs: Not on file  ?Physical Activity: Not on file  ?Stress: Not on file  ?Social Connections: Not on file  ?Intimate Partner Violence: Not on file  ? ? ?Review of Systems: ?ROS negative except for what is noted on the assessment and plan. ? ?Objective:  ?There were no vitals filed for this visit. ? ?Physical Exam: ?Gen: A&O x3 and in no apparent distress, well  appearing and nourished. ?CV: RRR, no murmurs, S1/S2 presents  ?Resp: Clear to ascultation bilaterally  ?Abd: BS (+) x4, soft, non-tender abdomen, without hepatosplenomegaly or masses ?MSK: Grossly normal AROM and strength x4 extremities. ?Skin: good skin turgor, no rashes, unusual bruising, or prominent lesions.  ?Neuro: No focal deficits, grossly normal sensation and coordination.  ?Psych: Oriented x3 and responding appropriately. Intact memory, normal mood, judgement, affect, and insight.  ? ? ?Assessment & Plan:  ?See Encounters Tab for problem based charting. ? ?Patient discussed with Dr. Jimmye Norman ? ? ?Marianna Payment, D.O. ?[facility]  PGY-3 ?Pager: [phone]  Phone: [phone] ?Date 11/02/2021  Time 2:51 PM ? ?

## 2021-11-02 NOTE — Patient Instructions (Signed)
Thank you, [name] for allowing [place] to provide your care today. Today we discussed reflux and back pain.   ? ?Labs/Tests Ordered: ? ?Lab Orders    ?     BMP8+Anion Gap    ?     Vitamin D (25 hydroxy)     ? ?Referrals Ordered:  ? ?Referral Orders    ?     Ambulatory referral to Gastroenterology    ?     Ambulatory referral to Pain Clinic     ? ?Medication Changes:  ?There are no discontinued medications.  ? ?No orders of the defined types were placed in this encounter. ?  ? ?Health Maintenance Screening: ?There are no preventive care reminders to display for this patient.  ? ?Instructions:  ? ?Follow up: 4-6 months  ? ?Remember: If you have any questions or concerns, call our clinic at [phone] or after hours call [phone] and ask for the internal medicine resident on call. ? ?Marianna Payment, D.O. ?[facility] ? ?  ?

## 2021-11-02 NOTE — Assessment & Plan Note (Addendum)
Patient presents for follow-up appointment for her gastroesophageal reflux disease.  She is currently been on appropriate PPI therapy greater than 8 weeks with refractory symptoms.  She continues to complain of substernal chest pain/epigastric pain with frequent belching and dyspepsia.  She states that she continues to smoke every now and then without a definitive quantity.  She drinks alcohol occasionally but not consistently.  She does not take any NSAIDs.  She denies any melanotic or red bloody stools.  She denies any red flag symptoms.   ? ?Plan : ?-GI referral for upper endoscopy  ?-Continue PPI therapy.  May need to increase dose depending on GI recommendations.   ?

## 2021-11-03 ENCOUNTER — Encounter: Payer: Self-pay | Admitting: Internal Medicine

## 2021-11-03 LAB — BMP8+ANION GAP
Anion Gap: 16 mmol/L (ref 10.0–18.0)
BUN/Creatinine Ratio: 11 — ABNORMAL LOW (ref 12–28)
BUN: 9 mg/dL (ref 8–27)
CO2: 26 mmol/L (ref 20–29)
Calcium: 9.2 mg/dL (ref 8.7–10.3)
Chloride: 97 mmol/L (ref 96–106)
Creatinine, Ser: 0.81 mg/dL (ref 0.57–1.00)
Glucose: 114 mg/dL — ABNORMAL HIGH (ref 70–99)
Potassium: 4 mmol/L (ref 3.5–5.2)
Sodium: 139 mmol/L (ref 134–144)
eGFR: 80 mL/min/{1.73_m2} (ref >59)

## 2021-11-03 LAB — VITAMIN D 25 HYDROXY (VIT D DEFICIENCY, FRACTURES): Vit D, 25-Hydroxy: 25 ng/mL — ABNORMAL LOW (ref 30.0–100.0)

## 2021-11-03 NOTE — Progress Notes (Signed)
Internal Medicine Clinic Attending  Case discussed with Dr. Coe  At the time of the visit.  We reviewed the resident's history and exam and pertinent patient test results.  I agree with the assessment, diagnosis, and plan of care documented in the resident's note.  

## 2021-11-03 NOTE — Assessment & Plan Note (Signed)
FacetPatient has a history of lumbar back pain due to OA of at L4-5, some 4 minimal disc protrusion bilaterally at the L4 vertebral segment, left disc protrusion at the L5-S1 segment with mass effect on the thecal sac and S1 nerve root, and mild foramenal encroachment bilaterally.  Was seen on MRI performed at 05/30/2012.  She continues to have chronic pain.  She previously had spinal injections with good result.  She is interested in repeat injections. ? ?Plan: ?-Pain management referral for spinal injections. ?

## 2021-11-03 NOTE — Assessment & Plan Note (Signed)
Patient continues to take vit D 1000 units dialy ? ?Plan:  ?- recheck vit D level ?

## 2021-11-03 NOTE — Assessment & Plan Note (Signed)
Pneumonia vaccine and Tdap provided today. ?

## 2021-11-04 NOTE — Progress Notes (Signed)
Patient called.  Patient aware.  

## 2022-01-26 ENCOUNTER — Other Ambulatory Visit: Payer: Self-pay

## 2022-01-26 DIAGNOSIS — M5416 Radiculopathy, lumbar region: Secondary | ICD-10-CM

## 2022-02-01 MED ORDER — GABAPENTIN 300 MG PO CAPS: ORAL_CAPSULE | ORAL | 1 refills | Status: DC

## 2022-04-11 NOTE — Progress Notes (Unsigned)
   CC: follow up  HPI:  [name] is a 66 y.o. with medical history of HLD, GERD, tobacco use disorder, and MDD presenting to [facility] for a follow up.   Please see problem-based list for further details, assessments, and plans.  Past Medical History:  Diagnosis Date   Back pain 07/07/2009   Bradycardia 05/15/2006   Bursitis of right shoulder 05/10/2010   Depression 05/15/2006   Ecchymoses, spontaneous 08/12/2010   Fatigue 08/12/2010   GERD (gastroesophageal reflux disease) 05/15/2006   Inguinal pain 03/23/2010   right   Mole (skin) 07/07/2009   Personal history of goiter 05/15/2006   Shoulder pain, right 07/07/2009   Smoker 05/10/2010   Weight gain 08/12/2010     Current Outpatient Medications (Cardiovascular):    pravastatin (PRAVACHOL) 40 MG tablet, Take 1 tablet (40 mg total) by mouth every evening.   Current Outpatient Medications (Analgesics):    acetaminophen (TYLENOL) 500 MG tablet, Take 1 tablet (500 mg total) by mouth every 6 (six) hours as needed.   Current Outpatient Medications (Other):    cholecalciferol (VITAMIN D) 1000 units tablet, Take 1 tablet (1,000 Units total) by mouth daily.   FLUoxetine (PROZAC) 40 MG capsule, Take 80 mg by mouth daily.   gabapentin (NEURONTIN) 300 MG capsule, TAKE 2 CAPSULES(600 MG) BY MOUTH THREE TIMES DAILY Strength: 300 mg   Multiple Vitamin (MULTIVITAMIN WITH MINERALS) TABS, Take 1 tablet by mouth daily.   omeprazole (PRILOSEC) 20 MG capsule, Take 2 capsules (40 mg total) by mouth daily.  Review of Systems:  Review of system negative unless stated in the problem list or HPI.    Physical Exam:  There were no vitals filed for this visit.  Physical Exam General: NAD HENT: NCAT Lungs: CTAB, no wheeze, rhonchi or rales.  Cardiovascular: Normal heart sounds, no r/m/g, 2+ pulses in all extremities. No LE edema Abdomen: No TTP, normal bowel sounds MSK: No asymmetry or muscle atrophy.  Skin: no lesions noted on exposed  skin Neuro: Alert and oriented x4. CN grossly intact Psych: Normal mood and normal affect   Assessment & Plan:   No problem-specific Assessment & Plan notes found for this encounter.   See Encounters Tab for problem based charting.  Patient discussed with Dr. {NAMES:[id number]::"Guilloud","Hoffman","Mullen","Narendra","Vincent","Machen","Lau","Hatcher"} Amanda Schuller, MD Tillie Rung. [facility] Internal Medicine Residency, PGY-2

## 2022-04-12 ENCOUNTER — Ambulatory Visit (INDEPENDENT_AMBULATORY_CARE_PROVIDER_SITE_OTHER): Payer: Medicare Other | Admitting: Internal Medicine

## 2022-04-12 ENCOUNTER — Ambulatory Visit: Payer: Medicare Other

## 2022-04-12 ENCOUNTER — Encounter: Payer: Self-pay | Admitting: Internal Medicine

## 2022-04-12 VITALS — BP 121/75 | HR 60 | Temp 98.2°F | Ht 66.0 in | Wt 126.0 lb

## 2022-04-12 DIAGNOSIS — Z0000 Encounter for general adult medical examination without abnormal findings: Secondary | ICD-10-CM

## 2022-04-12 DIAGNOSIS — K219 Gastro-esophageal reflux disease without esophagitis: Secondary | ICD-10-CM

## 2022-04-12 DIAGNOSIS — F3340 Major depressive disorder, recurrent, in remission, unspecified: Secondary | ICD-10-CM | POA: Diagnosis not present

## 2022-04-12 DIAGNOSIS — Z1231 Encounter for screening mammogram for malignant neoplasm of breast: Secondary | ICD-10-CM

## 2022-04-12 DIAGNOSIS — J309 Allergic rhinitis, unspecified: Secondary | ICD-10-CM | POA: Diagnosis not present

## 2022-04-12 DIAGNOSIS — M5416 Radiculopathy, lumbar region: Secondary | ICD-10-CM

## 2022-04-12 DIAGNOSIS — E785 Hyperlipidemia, unspecified: Secondary | ICD-10-CM

## 2022-04-12 DIAGNOSIS — F17210 Nicotine dependence, cigarettes, uncomplicated: Secondary | ICD-10-CM

## 2022-04-12 DIAGNOSIS — Z23 Encounter for immunization: Secondary | ICD-10-CM | POA: Diagnosis not present

## 2022-04-12 DIAGNOSIS — E2839 Other primary ovarian failure: Secondary | ICD-10-CM

## 2022-04-12 DIAGNOSIS — Z720 Tobacco use: Secondary | ICD-10-CM

## 2022-04-12 DIAGNOSIS — M545 Low back pain, unspecified: Secondary | ICD-10-CM

## 2022-04-12 DIAGNOSIS — E559 Vitamin D deficiency, unspecified: Secondary | ICD-10-CM

## 2022-04-12 MED ORDER — OMEPRAZOLE 40 MG PO CPDR: 40.0000 mg | DELAYED_RELEASE_CAPSULE | Freq: Every day | ORAL | 2 refills | Status: DC

## 2022-04-12 MED ORDER — GABAPENTIN 300 MG PO CAPS: 600.0000 mg | ORAL_CAPSULE | Freq: Three times a day (TID) | ORAL | 11 refills | Status: DC

## 2022-04-12 MED ORDER — CETIRIZINE HCL 10 MG PO TABS: 10.0000 mg | ORAL_TABLET | Freq: Every day | ORAL | 3 refills | Status: DC

## 2022-04-12 NOTE — Assessment & Plan Note (Signed)
Patient has been out of her pravastatin 40 mg qd for 3-4 months. States her drug store switched which interfered with her medications. Chol 233, LDL 144. ASCVD 17.3 %. Repeat lipid panel this visit.

## 2022-04-12 NOTE — Assessment & Plan Note (Signed)
Sinuses bothering her. Flonase helped her. Never tried allegra or zyrtec.

## 2022-04-12 NOTE — Assessment & Plan Note (Signed)
States GERD is well controlled with Protonix 40 mg qd. Needs refill. Has not followed with GI. Referral placed and approved. Will follow up on the referral.   Addendum: After following up on the referral, the referral has closed. Will place another referral.

## 2022-04-12 NOTE — Assessment & Plan Note (Signed)
Patient states her back pain is well controlled with gabapentin 200 mg TID. States get epidural once the back pain flares up which it is not doing currently.

## 2022-04-12 NOTE — Assessment & Plan Note (Signed)
Counseled on cessation. Pt not ready to quit but has cut down.

## 2022-04-12 NOTE — Patient Instructions (Addendum)
Ms.Kendalyn Holley Raring, it was a pleasure seeing you today! You endorsed feeling well today. Below are some of the things we talked about this visit. We look forward to seeing you in the follow up appointment!  Today we discussed: We are refilling your medications. Please take them every day. We are starting zyrtec for your allergies. Please take it every day.  We are doing lab work today.  We are also given you a flu shot, ordered a mammogram, and bone density testing.  I will follow up on your stomach doctor referral.    I have ordered the following labs today:  Lab Orders         Lipid Profile         Vitamin D (25 hydroxy)        Referrals ordered today:   Referral Orders  No referral(s) requested today     I have ordered the following medication/changed the following medications:   Stop the following medications: Medications Discontinued During This Encounter  Medication Reason   cholecalciferol (VITAMIN D) 1000 units tablet Completed Course   omeprazole (PRILOSEC) 20 MG capsule    gabapentin (NEURONTIN) 300 MG capsule Reorder     Start the following medications: Meds ordered this encounter  Medications   omeprazole (PRILOSEC) 40 MG capsule    Sig: Take 1 capsule (40 mg total) by mouth daily.    Dispense:  30 capsule    Refill:  2   gabapentin (NEURONTIN) 300 MG capsule    Sig: Take 2 capsules (600 mg total) by mouth 3 (three) times daily. TAKE 2 CAPSULES(600 MG) BY MOUTH THREE TIMES DAILY Strength: 300 mg    Dispense:  180 capsule    Refill:  11   cetirizine (ZYRTEC ALLERGY) 10 MG tablet    Sig: Take 1 tablet (10 mg total) by mouth daily.    Dispense:  90 tablet    Refill:  3     Follow-up: 3 month follow up  Please make sure to arrive 15 minutes prior to your next appointment. If you arrive late, you may be asked to reschedule.   We look forward to seeing you next time. Please call our clinic at [phone] if you have any questions or concerns. The best time to  call is Monday-Friday from 9am-4pm, but there is someone available 24/7. If after hours or the weekend, call the main hospital number and ask for the Internal Medicine Resident On-Call. If you need medication refills, please notify your pharmacy one week in advance and they will send [place] a request.  Thank you for letting us take part in your care. Wishing you the best!  Thank you, Idamae Schuller, MD

## 2022-04-12 NOTE — Assessment & Plan Note (Signed)
Pt last Vitamin D level was low at 25. Pt supplemented with one bottle and then stopped. Will re-check vitamin D and supplement if needed. Vitamin D recheck shows level at 18. Will have pt supplement with 2000 units daily and recheck in 3 months.

## 2022-04-12 NOTE — Assessment & Plan Note (Signed)
Sees psychiatry at [facility]. On Prozac 80 mg qd. PHQ 9 score 20. Her increase in score is situational from her daughter illness. Will continue current treatment.

## 2022-04-13 LAB — LIPID PANEL
Chol/HDL Ratio: 3.7 ratio (ref 0.0–4.4)
Cholesterol, Total: 272 mg/dL — ABNORMAL HIGH (ref 100–199)
HDL: 73 mg/dL (ref >39)
LDL Chol Calc (NIH): 175 mg/dL — ABNORMAL HIGH (ref 0–99)
Triglycerides: 138 mg/dL (ref 0–149)
VLDL Cholesterol Cal: 24 mg/dL (ref 5–40)

## 2022-04-13 LAB — VITAMIN D 25 HYDROXY (VIT D DEFICIENCY, FRACTURES): Vit D, 25-Hydroxy: 18.7 ng/mL — ABNORMAL LOW (ref 30.0–100.0)

## 2022-04-13 NOTE — Assessment & Plan Note (Addendum)
Flu shot given today. Dexa scan ordered today.

## 2022-04-14 MED ORDER — VITAMIN D 25 MCG (1000 UNIT) PO TABS: 2000.0000 [IU] | ORAL_TABLET | Freq: Every day | ORAL | 2 refills | Status: DC

## 2022-04-14 MED ORDER — ROSUVASTATIN CALCIUM 20 MG PO TABS: 20.0000 mg | ORAL_TABLET | Freq: Every day | ORAL | 11 refills | Status: DC

## 2022-04-22 NOTE — Progress Notes (Signed)
Internal Medicine Clinic Attending  Case discussed with Dr. Khan  At the time of the visit.  We reviewed the resident's history and exam and pertinent patient test results.  I agree with the assessment, diagnosis, and plan of care documented in the resident's note.  

## 2022-06-02 ENCOUNTER — Other Ambulatory Visit: Payer: Medicare Other

## 2022-07-13 ENCOUNTER — Inpatient Hospital Stay (HOSPITAL_COMMUNITY)
Admission: EM | Admit: 2022-07-13 | Discharge: 2022-07-21 | DRG: 329 | Disposition: A | Discharge status: Home Health | Payer: Medicare Other | Attending: Internal Medicine | Admitting: Internal Medicine

## 2022-07-13 ENCOUNTER — Other Ambulatory Visit: Payer: Self-pay

## 2022-07-13 ENCOUNTER — Emergency Department (HOSPITAL_COMMUNITY): Payer: Medicare Other

## 2022-07-13 ENCOUNTER — Inpatient Hospital Stay (HOSPITAL_COMMUNITY): Payer: Medicare Other

## 2022-07-13 ENCOUNTER — Encounter (HOSPITAL_COMMUNITY): Payer: Self-pay

## 2022-07-13 DIAGNOSIS — E876 Hypokalemia: Secondary | ICD-10-CM | POA: Diagnosis not present

## 2022-07-13 DIAGNOSIS — K56609 Unspecified intestinal obstruction, unspecified as to partial versus complete obstruction: Secondary | ICD-10-CM | POA: Diagnosis present

## 2022-07-13 DIAGNOSIS — N736 Female pelvic peritoneal adhesions (postinfective): Secondary | ICD-10-CM | POA: Diagnosis present

## 2022-07-13 DIAGNOSIS — R451 Restlessness and agitation: Secondary | ICD-10-CM | POA: Diagnosis not present

## 2022-07-13 DIAGNOSIS — K559 Vascular disorder of intestine, unspecified: Secondary | ICD-10-CM | POA: Diagnosis not present

## 2022-07-13 DIAGNOSIS — R059 Cough, unspecified: Secondary | ICD-10-CM | POA: Diagnosis not present

## 2022-07-13 DIAGNOSIS — Z1152 Encounter for screening for COVID-19: Secondary | ICD-10-CM

## 2022-07-13 DIAGNOSIS — R001 Bradycardia, unspecified: Secondary | ICD-10-CM

## 2022-07-13 DIAGNOSIS — D649 Anemia, unspecified: Secondary | ICD-10-CM | POA: Diagnosis not present

## 2022-07-13 DIAGNOSIS — I9581 Postprocedural hypotension: Secondary | ICD-10-CM | POA: Diagnosis not present

## 2022-07-13 DIAGNOSIS — F1410 Cocaine abuse, uncomplicated: Secondary | ICD-10-CM | POA: Diagnosis present

## 2022-07-13 DIAGNOSIS — G8929 Other chronic pain: Secondary | ICD-10-CM | POA: Diagnosis present

## 2022-07-13 DIAGNOSIS — K458 Other specified abdominal hernia without obstruction or gangrene: Secondary | ICD-10-CM

## 2022-07-13 DIAGNOSIS — R9431 Abnormal electrocardiogram [ECG] [EKG]: Secondary | ICD-10-CM | POA: Diagnosis present

## 2022-07-13 DIAGNOSIS — Z886 Allergy status to analgesic agent status: Secondary | ICD-10-CM

## 2022-07-13 DIAGNOSIS — T68XXXA Hypothermia, initial encounter: Secondary | ICD-10-CM | POA: Diagnosis not present

## 2022-07-13 DIAGNOSIS — Z818 Family history of other mental and behavioral disorders: Secondary | ICD-10-CM

## 2022-07-13 DIAGNOSIS — N179 Acute kidney failure, unspecified: Secondary | ICD-10-CM | POA: Diagnosis present

## 2022-07-13 DIAGNOSIS — K219 Gastro-esophageal reflux disease without esophagitis: Secondary | ICD-10-CM | POA: Diagnosis present

## 2022-07-13 DIAGNOSIS — Z833 Family history of diabetes mellitus: Secondary | ICD-10-CM

## 2022-07-13 DIAGNOSIS — F329 Major depressive disorder, single episode, unspecified: Secondary | ICD-10-CM | POA: Diagnosis present

## 2022-07-13 DIAGNOSIS — R4182 Altered mental status, unspecified: Secondary | ICD-10-CM | POA: Diagnosis present

## 2022-07-13 DIAGNOSIS — N289 Disorder of kidney and ureter, unspecified: Secondary | ICD-10-CM | POA: Diagnosis not present

## 2022-07-13 DIAGNOSIS — F1010 Alcohol abuse, uncomplicated: Secondary | ICD-10-CM | POA: Diagnosis present

## 2022-07-13 DIAGNOSIS — F17210 Nicotine dependence, cigarettes, uncomplicated: Secondary | ICD-10-CM | POA: Diagnosis present

## 2022-07-13 DIAGNOSIS — K55029 Acute infarction of small intestine, extent unspecified: Secondary | ICD-10-CM | POA: Diagnosis present

## 2022-07-13 DIAGNOSIS — I10 Essential (primary) hypertension: Secondary | ICD-10-CM | POA: Diagnosis not present

## 2022-07-13 DIAGNOSIS — Z79899 Other long term (current) drug therapy: Secondary | ICD-10-CM

## 2022-07-13 DIAGNOSIS — E785 Hyperlipidemia, unspecified: Secondary | ICD-10-CM | POA: Diagnosis present

## 2022-07-13 DIAGNOSIS — E559 Vitamin D deficiency, unspecified: Secondary | ICD-10-CM

## 2022-07-13 DIAGNOSIS — Z808 Family history of malignant neoplasm of other organs or systems: Secondary | ICD-10-CM

## 2022-07-13 DIAGNOSIS — F32A Depression, unspecified: Secondary | ICD-10-CM | POA: Diagnosis present

## 2022-07-13 DIAGNOSIS — E782 Mixed hyperlipidemia: Secondary | ICD-10-CM

## 2022-07-13 DIAGNOSIS — Z9049 Acquired absence of other specified parts of digestive tract: Secondary | ICD-10-CM

## 2022-07-13 DIAGNOSIS — K5652 Intestinal adhesions [bands] with complete obstruction: Principal | ICD-10-CM | POA: Diagnosis present

## 2022-07-13 DIAGNOSIS — F1210 Cannabis abuse, uncomplicated: Secondary | ICD-10-CM | POA: Diagnosis present

## 2022-07-13 DIAGNOSIS — Z800 Family history of malignant neoplasm of digestive organs: Secondary | ICD-10-CM

## 2022-07-13 DIAGNOSIS — Z841 Family history of disorders of kidney and ureter: Secondary | ICD-10-CM

## 2022-07-13 DIAGNOSIS — Z781 Physical restraint status: Secondary | ICD-10-CM

## 2022-07-13 DIAGNOSIS — Z823 Family history of stroke: Secondary | ICD-10-CM

## 2022-07-13 DIAGNOSIS — R680 Hypothermia, not associated with low environmental temperature: Secondary | ICD-10-CM | POA: Diagnosis present

## 2022-07-13 DIAGNOSIS — I9589 Other hypotension: Secondary | ICD-10-CM | POA: Diagnosis not present

## 2022-07-13 DIAGNOSIS — R7301 Impaired fasting glucose: Secondary | ICD-10-CM | POA: Diagnosis not present

## 2022-07-13 DIAGNOSIS — Z90710 Acquired absence of both cervix and uterus: Secondary | ICD-10-CM

## 2022-07-13 HISTORY — DX: Hypothermia, initial encounter: T68.XXXA

## 2022-07-13 LAB — CBC WITH DIFFERENTIAL/PLATELET
Abs Immature Granulocytes: 0.03 10*3/uL (ref 0.00–0.07)
Basophils Absolute: 0 10*3/uL (ref 0.0–0.1)
Basophils Relative: 0 %
Eosinophils Absolute: 0.1 10*3/uL (ref 0.0–0.5)
Eosinophils Relative: 1 %
HCT: 45.4 % (ref 36.0–46.0)
Hemoglobin: 15.2 g/dL — ABNORMAL HIGH (ref 12.0–15.0)
Immature Granulocytes: 1 %
Lymphocytes Relative: 24 %
Lymphs Abs: 1.1 10*3/uL (ref 0.7–4.0)
MCH: 30.5 pg (ref 26.0–34.0)
MCHC: 33.5 g/dL (ref 30.0–36.0)
MCV: 91.2 fL (ref 80.0–100.0)
Monocytes Absolute: 0.3 10*3/uL (ref 0.1–1.0)
Monocytes Relative: 7 %
Neutro Abs: 3 10*3/uL (ref 1.7–7.7)
Neutrophils Relative %: 67 %
Platelets: 235 10*3/uL (ref 150–400)
RBC: 4.98 MIL/uL (ref 3.87–5.11)
RDW: 12.9 % (ref 11.5–15.5)
WBC: 4.5 10*3/uL (ref 4.0–10.5)
nRBC: 0 % (ref 0.0–0.2)

## 2022-07-13 LAB — URINALYSIS, ROUTINE W REFLEX MICROSCOPIC
Bilirubin Urine: NEGATIVE
Glucose, UA: NEGATIVE mg/dL
Hgb urine dipstick: NEGATIVE
Ketones, ur: 5 mg/dL — AB
Nitrite: NEGATIVE
Protein, ur: NEGATIVE mg/dL
Specific Gravity, Urine: 1.017 (ref 1.005–1.030)
pH: 7 (ref 5.0–8.0)

## 2022-07-13 LAB — COMPREHENSIVE METABOLIC PANEL
ALT: 25 U/L (ref 0–44)
AST: 26 U/L (ref 15–41)
Albumin: 4.3 g/dL (ref 3.5–5.0)
Alkaline Phosphatase: 68 U/L (ref 38–126)
Anion gap: 14 (ref 5–15)
BUN: 15 mg/dL (ref 8–23)
CO2: 21 mmol/L — ABNORMAL LOW (ref 22–32)
Calcium: 10.2 mg/dL (ref 8.9–10.3)
Chloride: 104 mmol/L (ref 98–111)
Creatinine, Ser: 0.98 mg/dL (ref 0.44–1.00)
GFR, Estimated: >60 mL/min (ref >60)
Glucose, Bld: 186 mg/dL — ABNORMAL HIGH (ref 70–99)
Potassium: 3.6 mmol/L (ref 3.5–5.1)
Sodium: 139 mmol/L (ref 135–145)
Total Bilirubin: 1 mg/dL (ref 0.3–1.2)
Total Protein: 8.4 g/dL — ABNORMAL HIGH (ref 6.5–8.1)

## 2022-07-13 LAB — LACTIC ACID, PLASMA
Lactic Acid, Venous: 1.6 mmol/L (ref 0.5–1.9)
Lactic Acid, Venous: 1.4 mmol/L (ref 0.5–1.9)

## 2022-07-13 LAB — RESP PANEL BY RT-PCR (RSV, FLU A&B, COVID)  RVPGX2
Influenza A by PCR: NEGATIVE
Influenza B by PCR: NEGATIVE
Resp Syncytial Virus by PCR: NEGATIVE
SARS Coronavirus 2 by RT PCR: NEGATIVE

## 2022-07-13 LAB — MAGNESIUM: Magnesium: 2.1 mg/dL (ref 1.7–2.4)

## 2022-07-13 LAB — LIPASE, BLOOD: Lipase: 29 U/L (ref 11–51)

## 2022-07-13 LAB — TROPONIN I (HIGH SENSITIVITY)
Troponin I (High Sensitivity): 2 ng/L (ref <18)
Troponin I (High Sensitivity): 2 ng/L (ref <18)
Troponin I (High Sensitivity): <2 ng/L (ref <18)

## 2022-07-13 LAB — CK: Total CK: 37 U/L — ABNORMAL LOW (ref 38–234)

## 2022-07-13 LAB — PHOSPHORUS: Phosphorus: 3.2 mg/dL (ref 2.5–4.6)

## 2022-07-13 LAB — TSH: TSH: 0.605 u[IU]/mL (ref 0.350–4.500)

## 2022-07-13 LAB — PROTIME-INR
INR: 1 (ref 0.8–1.2)
Prothrombin Time: 13.3 seconds (ref 11.4–15.2)

## 2022-07-13 LAB — CBG MONITORING, ED: Glucose-Capillary: 181 mg/dL — ABNORMAL HIGH (ref 70–99)

## 2022-07-13 MED ORDER — DIATRIZOATE MEGLUMINE & SODIUM 66-10 % PO SOLN
90.0000 mL | Freq: Once | ORAL | Status: AC
  Administered 2022-07-13: 90 mL via NASOGASTRIC
  Filled 2022-07-13: qty 90

## 2022-07-13 MED ORDER — SODIUM CHLORIDE 0.9 % IV BOLUS
1000.0000 mL | Freq: Once | INTRAVENOUS | Status: AC
  Administered 2022-07-13: 1000 mL via INTRAVENOUS

## 2022-07-13 MED ORDER — ONDANSETRON HCL 4 MG/2ML IJ SOLN
4.0000 mg | Freq: Once | INTRAMUSCULAR | Status: AC
  Administered 2022-07-13: 4 mg via INTRAVENOUS
  Filled 2022-07-13: qty 2

## 2022-07-13 MED ORDER — ATROPINE SULFATE 1 MG/10ML IJ SOSY
1.0000 mg | PREFILLED_SYRINGE | Freq: Once | INTRAMUSCULAR | Status: AC
  Administered 2022-07-13: 1 mg via INTRAVENOUS

## 2022-07-13 MED ORDER — MORPHINE SULFATE (PF) 4 MG/ML IV SOLN
4.0000 mg | Freq: Once | INTRAVENOUS | Status: AC
  Administered 2022-07-13: 4 mg via INTRAVENOUS
  Filled 2022-07-13: qty 1

## 2022-07-13 MED ORDER — ATROPINE SULFATE 1 MG/10ML IJ SOSY
PREFILLED_SYRINGE | INTRAMUSCULAR | Status: AC
  Filled 2022-07-13: qty 10

## 2022-07-13 MED ORDER — ATROPINE SULFATE 1 MG/ML IV SOLN: 1.0000 mg | Freq: Once | INTRAVENOUS | Status: DC

## 2022-07-13 MED ORDER — BENZOCAINE 20 % MT AERO
INHALATION_SPRAY | Freq: Once | OROMUCOSAL | Status: AC
  Filled 2022-07-13: qty 57

## 2022-07-13 MED ORDER — IOHEXOL 300 MG/ML  SOLN
100.0000 mL | Freq: Once | INTRAMUSCULAR | Status: AC | PRN
  Administered 2022-07-13: 100 mL via INTRAVENOUS

## 2022-07-13 NOTE — ED Notes (Signed)
Pt's HR noted to drop into the 30s.  EDP called to bedside.

## 2022-07-13 NOTE — Assessment & Plan Note (Signed)
Likely cause internal hernia    - NG tube - NPO - KUB in AM - appreciate General surgery consult.  Given internal hernia may need further surgical intervention.  Lactic acid unremarkable abdominal pain now improved

## 2022-07-13 NOTE — ED Triage Notes (Signed)
Patient BIB [facility] from home. Nausea, vomiting, fever, chills, weakness that started last night. No diarrhea. Cramping abdominal pain all over.

## 2022-07-13 NOTE — Assessment & Plan Note (Signed)
Home medications on hold while n.p.o.

## 2022-07-13 NOTE — Consult Note (Signed)
Reason for Consult: Small bowel obstruction Referring Physician: Gilford Raid MD  [name] is an 66 y.o. female.  HPI: 66 year old female presents with multiday history of abdominal pain.  Most of her history I get from her daughter at the bedside since the patient is not talking to me.  Difficult to gauge his daughter has not been around her consistently.  Of note she came in hypothermic as well as bradycardic down to the 30s.  Parent has a history of a heart murmur according to her daughter or heart issues.  Any then she has had lower abdominal pain for least 2 days.  Unclear if she has had nausea or vomiting.  Tenderness on exam was noted by the EDP.  A CT scan shows small bowel obstruction.  There was some mention of possible internal hernia tension ischemia.  Her white count is normal with no left shift.  Her lactate is normal.  Her vital signs are stable at this point in time.  She does not give me any history or respond to me but her eyes are open.  She does not her head to some questions.  Past Medical History:  Diagnosis Date   Back pain 07/07/2009   Bilateral knee pain 05/29/2012   Standing films did not reveal much joint space loss so I wonder if most of her arthritic changes are in the patellofemoral joint. She has received significant relief from corticosteroid injection. If we need to further investigate this would recommend MRI of the knee. Right knee seems worse than left.   Bradycardia 05/15/2006   Bursitis of right shoulder 05/10/2010   Depression 05/15/2006   Ecchymoses, spontaneous 08/12/2010   Fatigue 08/12/2010   GERD (gastroesophageal reflux disease) 05/15/2006   Inguinal pain 03/23/2010   right   Intertrigo 03/04/2014   Left hip pain 11/25/2019   Meralgia paresthetica of right side 05/08/2019   Mole (skin) 07/07/2009   Numbness and tingling of right arm 02/26/2018   Personal history of goiter 05/15/2006   Right hand pain 11/25/2020   Shoulder pain, right 07/07/2009    Smoker 05/10/2010   Tendinopathy of left rotator cuff 09/09/2016   Weight gain 08/12/2010    Past Surgical History:  Procedure Laterality Date   ABDOMINAL HYSTERECTOMY     CHOLECYSTECTOMY      Family History  Problem Relation Age of Onset   Diabetes Mother    Cancer Mother        Stomach   Diabetes Father    Kidney disease Father    Cancer Sister        Stomach   Stroke Brother    Cancer Brother        Stomach   Mental illness Sister    Cancer Daughter        skin    Social History:  reports that she has been smoking cigarettes. She has been smoking an average of .1 packs per day. She has never used smokeless tobacco. She reports current alcohol use. She reports current drug use. Frequency: 1.00 time per week. Drug: Marijuana.  Allergies:  Allergies  Allergen Reactions   Aspirin Nausea And Vomiting    Other Reaction(s): Unknown    Medications: I have reviewed the patient's current medications.  Results for orders placed or performed during the hospital encounter of 07/13/22 (from the past 48 hour(s))  CBC with Differential     Status: Abnormal   Collection Time: 07/13/22  6:37 PM  Result Value Ref Range  WBC 4.5 4.0 - 10.5 K/uL   RBC 4.98 3.87 - 5.11 MIL/uL   Hemoglobin 15.2 (H) 12.0 - 15.0 g/dL   HCT 45.4 36.0 - 46.0 %   MCV 91.2 80.0 - 100.0 fL   MCH 30.5 26.0 - 34.0 pg   MCHC 33.5 30.0 - 36.0 g/dL   RDW 12.9 11.5 - 15.5 %   Platelets 235 150 - 400 K/uL   nRBC 0.0 0.0 - 0.2 %   Neutrophils Relative % 67 %   Neutro Abs 3.0 1.7 - 7.7 K/uL   Lymphocytes Relative 24 %   Lymphs Abs 1.1 0.7 - 4.0 K/uL   Monocytes Relative 7 %   Monocytes Absolute 0.3 0.1 - 1.0 K/uL   Eosinophils Relative 1 %   Eosinophils Absolute 0.1 0.0 - 0.5 K/uL   Basophils Relative 0 %   Basophils Absolute 0.0 0.0 - 0.1 K/uL   Immature Granulocytes 1 %   Abs Immature Granulocytes 0.03 0.00 - 0.07 K/uL    Comment: Performed at [facility], [place] [address]  [place], [place] [address]  Comprehensive metabolic panel     Status: Abnormal   Collection Time: 07/13/22  6:37 PM  Result Value Ref Range   Sodium 139 135 - 145 mmol/L   Potassium 3.6 3.5 - 5.1 mmol/L   Chloride 104 98 - 111 mmol/L   CO2 21 (L) 22 - 32 mmol/L   Glucose, Bld 186 (H) 70 - 99 mg/dL    Comment: Glucose reference range applies only to samples taken after fasting for at least 8 hours.   BUN 15 8 - 23 mg/dL   Creatinine, Ser 0.98 0.44 - 1.00 mg/dL   Calcium 10.2 8.9 - 10.3 mg/dL   Total Protein 8.4 (H) 6.5 - 8.1 g/dL   Albumin 4.3 3.5 - 5.0 g/dL   AST 26 15 - 41 U/L   ALT 25 0 - 44 U/L   Alkaline Phosphatase 68 38 - 126 U/L   Total Bilirubin 1.0 0.3 - 1.2 mg/dL   GFR, Estimated >60 >60 mL/min    Comment: (NOTE) Calculated using the CKD-EPI Creatinine Equation (2021)    Anion gap 14 5 - 15    Comment: Performed at [facility], [place] [address] [place], [place] [address]  Lipase, blood     Status: None   Collection Time: 07/13/22  6:37 PM  Result Value Ref Range   Lipase 29 11 - 51 U/L    Comment: Performed at [facility], [place] [address], [place], [place] [address]  Magnesium     Status: None   Collection Time: 07/13/22  6:37 PM  Result Value Ref Range   Magnesium 2.1 1.7 - 2.4 mg/dL    Comment: Performed at [facility], [place] [address], [place], [place] [address]  POC CBG, ED     Status: Abnormal   Collection Time: 07/13/22  6:45 PM  Result Value Ref Range   Glucose-Capillary 181 (H) 70 - 99 mg/dL    Comment: Glucose reference range applies only to samples taken after fasting for at least 8 hours.  Lactic acid, plasma     Status: None   Collection Time: 07/13/22  6:45 PM  Result Value Ref Range   Lactic Acid, Venous 1.6 0.5 - 1.9 mmol/L    Comment: Performed at [facility], [place] [address], [place], [place] [address]  Resp panel by RT-PCR (RSV, Flu A&B, Covid) Anterior  Nasal Swab     Status:  None   Collection Time: 07/13/22  6:58 PM   Specimen: Anterior Nasal Swab  Result Value Ref Range   SARS Coronavirus 2 by RT PCR NEGATIVE NEGATIVE    Comment: (NOTE) SARS-CoV-2 target nucleic acids are NOT DETECTED.  The SARS-CoV-2 RNA is generally detectable in upper respiratory specimens during the acute phase of infection. The lowest concentration of SARS-CoV-2 viral copies this assay can detect is 138 copies/mL. A negative result does not preclude SARS-Cov-2 infection and should not be used as the sole basis for treatment or other patient management decisions. A negative result may occur with  improper specimen collection/handling, submission of specimen other than nasopharyngeal swab, presence of viral mutation(s) within the areas targeted by this assay, and inadequate number of viral copies(<138 copies/mL). A negative result must be combined with clinical observations, patient history, and epidemiological information. The expected result is Negative.  Fact Sheet for Patients:  EntrepreneurPulse.com.au  Fact Sheet for Healthcare Providers:  IncredibleEmployment.be  This test is no t yet approved or cleared by the [place] FDA and  has been authorized for detection and/or diagnosis of SARS-CoV-2 by FDA under an Emergency Use Authorization (EUA). This EUA will remain  in effect (meaning this test can be used) for the duration of the COVID-19 declaration under Section 564(b)(1) of the Act, 21 U.S.C.section 360bbb-3(b)(1), unless the authorization is terminated  or revoked sooner.       Influenza A by PCR NEGATIVE NEGATIVE   Influenza B by PCR NEGATIVE NEGATIVE    Comment: (NOTE) The Xpert Xpress SARS-CoV-2/FLU/RSV plus assay is intended as an aid in the diagnosis of influenza from Nasopharyngeal swab specimens and should not be used as a sole basis for treatment. Nasal washings and aspirates are unacceptable for Xpert Xpress  SARS-CoV-2/FLU/RSV testing.  Fact Sheet for Patients: EntrepreneurPulse.com.au  Fact Sheet for Healthcare Providers: IncredibleEmployment.be  This test is not yet approved or cleared by the [place] FDA and has been authorized for detection and/or diagnosis of SARS-CoV-2 by FDA under an Emergency Use Authorization (EUA). This EUA will remain in effect (meaning this test can be used) for the duration of the COVID-19 declaration under Section 564(b)(1) of the Act, 21 U.S.C. section 360bbb-3(b)(1), unless the authorization is terminated or revoked.     Resp Syncytial Virus by PCR NEGATIVE NEGATIVE    Comment: (NOTE) Fact Sheet for Patients: EntrepreneurPulse.com.au  Fact Sheet for Healthcare Providers: IncredibleEmployment.be  This test is not yet approved or cleared by the [place] FDA and has been authorized for detection and/or diagnosis of SARS-CoV-2 by FDA under an Emergency Use Authorization (EUA). This EUA will remain in effect (meaning this test can be used) for the duration of the COVID-19 declaration under Section 564(b)(1) of the Act, 21 U.S.C. section 360bbb-3(b)(1), unless the authorization is terminated or revoked.  Performed at [facility], [place] [address] [place], [place] [address]   Troponin I (High Sensitivity)     Status: None   Collection Time: 07/13/22  7:01 PM  Result Value Ref Range   Troponin I (High Sensitivity) 2 <18 ng/L    Comment: (NOTE) Elevated high sensitivity troponin I (hsTnI) values and significant  changes across serial measurements may suggest ACS but many other  chronic and acute conditions are known to elevate hsTnI results.  Refer to the "Links" section for chest pain algorithms and additional  guidance. Performed at [facility], [place] [address] [place],  [place] [address]   Urinalysis, Routine w reflex  microscopic     Status: Abnormal   Collection Time: 07/13/22  8:50 PM  Result Value Ref Range   Color, Urine YELLOW YELLOW   APPearance CLEAR CLEAR   Specific Gravity, Urine 1.017 1.005 - 1.030   pH 7.0 5.0 - 8.0   Glucose, UA NEGATIVE NEGATIVE mg/dL   Hgb urine dipstick NEGATIVE NEGATIVE   Bilirubin Urine NEGATIVE NEGATIVE   Ketones, ur 5 (A) NEGATIVE mg/dL   Protein, ur NEGATIVE NEGATIVE mg/dL   Nitrite NEGATIVE NEGATIVE   Leukocytes,Ua TRACE (A) NEGATIVE   RBC / HPF 0-5 0 - 5 RBC/hpf   WBC, UA 0-5 0 - 5 WBC/hpf   Bacteria, UA RARE (A) NONE SEEN   Squamous Epithelial / LPF 0-5 0 - 5   Mucus PRESENT    Hyaline Casts, UA PRESENT     Comment: Performed at [facility], [place] [address] [place], [place] [address]    CT ABDOMEN PELVIS W CONTRAST  Result Date: 07/13/2022 CLINICAL DATA:  Abdominal pain EXAM: CT ABDOMEN AND PELVIS WITH CONTRAST TECHNIQUE: Multidetector CT imaging of the abdomen and pelvis was performed using the standard protocol following bolus administration of intravenous contrast. RADIATION DOSE REDUCTION: This exam was performed according to the departmental dose-optimization program which includes automated exposure control, adjustment of the mA and/or kV according to patient size and/or use of iterative reconstruction technique. CONTRAST:  140m OMNIPAQUE IOHEXOL 300 MG/ML  SOLN COMPARISON:  None Available. FINDINGS: Lower chest: No acute abnormality. Hepatobiliary: No focal liver abnormality is seen. Status post cholecystectomy. No biliary dilatation. Pancreas: Unremarkable. No pancreatic ductal dilatation or surrounding inflammatory changes. Spleen: Normal in size without focal abnormality. Adrenals/Urinary Tract: Bilateral adrenal glands are unremarkable. No hydronephrosis or nephrolithiasis. Bladder is unremarkable. Stomach/Bowel: Multiple dilated and thick-walled loops of small bowel are clustered with transition point in the left pelvis, on series  2, image 59 with decompressed distal small bowel exiting neck of the internal hernia which is best appreciated on coronal series 5, image 71, and upstream dilation of small bowel loops. Engorgement of the mesenteric vessels. Normal appendix. Vascular/Lymphatic: Aortic atherosclerosis. No enlarged abdominal or pelvic lymph nodes. Reproductive: No adnexal masses. Other:  Trace free fluid of the right hemiabdomen. Musculoskeletal: No acute or significant osseous findings. IMPRESSION: 1. Multiple dilated and thick-walled loops of small bowel are clustered within the pelvis with internal hernia neck in the left pelvis, findings are consistent with small bowel obstruction secondary to internal hernia. Engorgement of the mesenteric vessels and small bowel wall thickening, raises concern for ischemia. No evidence of pneumatosis or free air. 2. Trace free fluid of the right hemiabdomen. 3. Aortic Atherosclerosis (ICD10-I70.0). Critical Value/emergent results were called by telephone at the time of interpretation on 07/13/2022 at 8:28 pm to [facility], who verbally acknowledged these results. Electronically Signed   By: LYetta GlassmanM.D.   On: 07/13/2022 20:32   DG Chest Portable 1 View  Result Date: 07/13/2022 CLINICAL DATA:  Nausea vomiting EXAM: PORTABLE CHEST 1 VIEW COMPARISON:  07/23/2012 FINDINGS: The heart size and mediastinal contours are within normal limits. Aortic atherosclerosis. Both lungs are clear. The visualized skeletal structures are unremarkable. IMPRESSION: No active disease. Electronically Signed   By: KDonavan FoilM.D.   On: 07/13/2022 19:15    Review of Systems  Unable to perform ROS: Patient nonverbal   Blood pressure (!) 131/115, pulse 61, temperature (!) 94.7 F (34.8 C), temperature  source Rectal, resp. rate (!) 23, last menstrual period 11/08/1991, SpO2 100 %. Physical Exam Constitutional:      General: She is not in acute distress. HENT:     Head: Normocephalic.   Abdominal:     General: Abdomen is flat.     Palpations: Abdomen is soft.     Tenderness: There is abdominal tenderness in the right lower quadrant and left lower quadrant. There is no guarding or rebound.     Hernia: No hernia is present.       Comments: Scars noted in right upper quadrant and lower midline.  Soft.  Nonrigid.  Some mild lower abdominal tenderness to deep palpation.  No rebound, no guarding.  Neurological:     Mental Status: She is alert.     Assessment/Plan: Small bowel obstruction-normal lactate, white count without left shift and examination without peritonitis at this time.  Reviewed the CT scan myself.  Question internal hernia but her examination does not show signs of any peritonitis or signs of bowel compromise at this point in time.  She is nonverbal so difficult to discern but no signs of peritonitis or emergent need for exploration.  Recommend NG tube.  Will initiate the small bowel protocol.  Unclear of etiology of hypothermia and bradycardia.  Medicine to evaluate as well given bradycardia and hypothermia.  Surgery to continue to follow.  Keep n.p.o. for now.  Rockie Vawter A Rockell Faulks 07/13/2022, 9:43 PM    45 minutes total time

## 2022-07-13 NOTE — Assessment & Plan Note (Signed)
Seems to be in the setting of severe abdominal pain felt to be may be vasovagal.  Patient was given a dose of atropine with improvement continue to monitor obtain echogram check TSH no chest pain troponin within normal limits

## 2022-07-13 NOTE — ED Notes (Signed)
Bair hugger applied.

## 2022-07-13 NOTE — ED Provider Notes (Signed)
[facility] DEPT Provider Note   CSN: [id number] Arrival date & time: 07/13/22  1805     History  Chief Complaint  Patient presents with   Abdominal Pain    [name] is a 66 y.o. female.  Pt is a 66 yo female with pmhx significant for GERD, HLD, MDD, and tobacco use d/o.  Pt presents to the ED today with n/v/d and abd pain.  Pt said sx started last night.  She is a poor historian.         Home Medications Prior to Admission medications   Medication Sig Start Date End Date Taking? Authorizing Provider  acetaminophen (TYLENOL) 500 MG tablet Take 1 tablet (500 mg total) by mouth every 6 (six) hours as needed. 10/12/14   Juluis Mire, MD  cetirizine (ZYRTEC ALLERGY) 10 MG tablet Take 1 tablet (10 mg total) by mouth daily. 04/12/22 04/12/23  Idamae Schuller, MD  cholecalciferol (VITAMIN D3) 25 MCG (1000 UNIT) tablet Take 2 tablets (2,000 Units total) by mouth daily. 04/14/22 07/13/22  Idamae Schuller, MD  FLUoxetine (PROZAC) 40 MG capsule Take 80 mg by mouth daily.    [provider]  gabapentin (NEURONTIN) 300 MG capsule Take 2 capsules (600 mg total) by mouth 3 (three) times daily. TAKE 2 CAPSULES(600 MG) BY MOUTH THREE TIMES DAILY Strength: 300 mg 04/12/22 04/12/23  Idamae Schuller, MD  Multiple Vitamin (MULTIVITAMIN WITH MINERALS) TABS Take 1 tablet by mouth daily.    [provider]  omeprazole (PRILOSEC) 40 MG capsule Take 1 capsule (40 mg total) by mouth daily. 04/12/22 07/11/22  Idamae Schuller, MD  rosuvastatin (CRESTOR) 20 MG tablet Take 1 tablet (20 mg total) by mouth daily. 04/14/22 04/14/23  Idamae Schuller, MD      Allergies    Aspirin    Review of Systems   Review of Systems  Gastrointestinal:  Positive for abdominal pain, diarrhea, nausea and vomiting.  All other systems reviewed and are negative.   Physical Exam Updated Vital Signs BP (!) 131/115   Pulse 61   Temp (!) 94.7 F (34.8 C) (Rectal)   Resp (!) 23   LMP  11/08/1991   SpO2 100%  Physical Exam Vitals and nursing note reviewed.  Constitutional:      Appearance: She is ill-appearing and diaphoretic.  HENT:     Head: Normocephalic and atraumatic.     Mouth/Throat:     Mouth: Mucous membranes are dry.     Pharynx: Oropharynx is clear.  Eyes:     Extraocular Movements: Extraocular movements intact.     Pupils: Pupils are equal, round, and reactive to light.  Cardiovascular:     Rate and Rhythm: Regular rhythm. Bradycardia present.     Heart sounds: Normal heart sounds.  Pulmonary:     Effort: Pulmonary effort is normal.     Breath sounds: Normal breath sounds.  Abdominal:     General: Abdomen is flat. Bowel sounds are normal.     Palpations: Abdomen is soft.     Tenderness: There is generalized abdominal tenderness.  Skin:    General: Skin is warm.     Capillary Refill: Capillary refill takes less than 2 seconds.  Neurological:     General: No focal deficit present.     Mental Status: She is alert and oriented to person, place, and time.  Psychiatric:        Mood and Affect: Mood normal.        Behavior: Behavior  normal.     ED Results / Procedures / Treatments   Labs (all labs ordered are listed, but only abnormal results are displayed) Labs Reviewed  CBC WITH DIFFERENTIAL/PLATELET - Abnormal; Notable for the following components:      Result Value   Hemoglobin 15.2 (*)    All other components within normal limits  COMPREHENSIVE METABOLIC PANEL - Abnormal; Notable for the following components:   CO2 21 (*)    Glucose, Bld 186 (*)    Total Protein 8.4 (*)    All other components within normal limits  URINALYSIS, ROUTINE W REFLEX MICROSCOPIC - Abnormal; Notable for the following components:   Ketones, ur 5 (*)    Leukocytes,Ua TRACE (*)    Bacteria, UA RARE (*)    All other components within normal limits  CBG MONITORING, ED - Abnormal; Notable for the following components:   Glucose-Capillary 181 (*)    All other  components within normal limits  RESP PANEL BY RT-PCR (RSV, FLU A&B, COVID)  RVPGX2  CULTURE, BLOOD (ROUTINE X 2)  CULTURE, BLOOD (ROUTINE X 2)  LIPASE, BLOOD  MAGNESIUM  LACTIC ACID, PLASMA  LACTIC ACID, PLASMA  CK  PHOSPHORUS  PREALBUMIN  PROTIME-INR  TSH  I-STAT CHEM 8, ED  TROPONIN I (HIGH SENSITIVITY)  TROPONIN I (HIGH SENSITIVITY)  TROPONIN I (HIGH SENSITIVITY)    EKG EKG Interpretation  Date/Time:  Wednesday July 13 2022 18:48:40 EST Ventricular Rate:  67 PR Interval:  133 QRS Duration: 88 QT Interval:  496 QTC Calculation: 524 R Axis:   31 Text Interpretation: Sinus rhythm Atrial premature complexes Anteroseptal infarct, age indeterminate Prolonged QT interval Since last tracing rate faster Confirmed by Isla Pence [phone]) on 07/13/2022 7:29:03 PM   Radiology CT ABDOMEN PELVIS W CONTRAST  Result Date: 07/13/2022 CLINICAL DATA:  Abdominal pain EXAM: CT ABDOMEN AND PELVIS WITH CONTRAST TECHNIQUE: Multidetector CT imaging of the abdomen and pelvis was performed using the standard protocol following bolus administration of intravenous contrast. RADIATION DOSE REDUCTION: This exam was performed according to the departmental dose-optimization program which includes automated exposure control, adjustment of the mA and/or kV according to patient size and/or use of iterative reconstruction technique. CONTRAST:  151m OMNIPAQUE IOHEXOL 300 MG/ML  SOLN COMPARISON:  None Available. FINDINGS: Lower chest: No acute abnormality. Hepatobiliary: No focal liver abnormality is seen. Status post cholecystectomy. No biliary dilatation. Pancreas: Unremarkable. No pancreatic ductal dilatation or surrounding inflammatory changes. Spleen: Normal in size without focal abnormality. Adrenals/Urinary Tract: Bilateral adrenal glands are unremarkable. No hydronephrosis or nephrolithiasis. Bladder is unremarkable. Stomach/Bowel: Multiple dilated and thick-walled loops of small bowel are clustered  with transition point in the left pelvis, on series 2, image 59 with decompressed distal small bowel exiting neck of the internal hernia which is best appreciated on coronal series 5, image 71, and upstream dilation of small bowel loops. Engorgement of the mesenteric vessels. Normal appendix. Vascular/Lymphatic: Aortic atherosclerosis. No enlarged abdominal or pelvic lymph nodes. Reproductive: No adnexal masses. Other:  Trace free fluid of the right hemiabdomen. Musculoskeletal: No acute or significant osseous findings. IMPRESSION: 1. Multiple dilated and thick-walled loops of small bowel are clustered within the pelvis with internal hernia neck in the left pelvis, findings are consistent with small bowel obstruction secondary to internal hernia. Engorgement of the mesenteric vessels and small bowel wall thickening, raises concern for ischemia. No evidence of pneumatosis or free air. 2. Trace free fluid of the right hemiabdomen. 3. Aortic Atherosclerosis (ICD10-I70.0). Critical Value/emergent results were called  by telephone at the time of interpretation on 07/13/2022 at 8:28 pm to provider [facility] , who verbally acknowledged these results. Electronically Signed   By: Yetta Glassman M.D.   On: 07/13/2022 20:32   DG Chest Portable 1 View  Result Date: 07/13/2022 CLINICAL DATA:  Nausea vomiting EXAM: PORTABLE CHEST 1 VIEW COMPARISON:  07/23/2012 FINDINGS: The heart size and mediastinal contours are within normal limits. Aortic atherosclerosis. Both lungs are clear. The visualized skeletal structures are unremarkable. IMPRESSION: No active disease. Electronically Signed   By: Donavan Foil M.D.   On: 07/13/2022 19:15    Procedures Procedures    Medications Ordered in ED Medications  morphine (PF) 4 MG/ML injection 4 mg (has no administration in time range)  Benzocaine (HURRCAINE) 20 % mouth spray (has no administration in time range)  sodium chloride 0.9 % bolus 1,000 mL (0 mLs Intravenous  Stopped 07/13/22 1924)  ondansetron (ZOFRAN) injection 4 mg (4 mg Intravenous Given 07/13/22 1834)  atropine 1 MG/10ML injection 1 mg (1 mg Intravenous Given 07/13/22 1845)  iohexol (OMNIPAQUE) 300 MG/ML solution 100 mL (100 mLs Intravenous Contrast Given 07/13/22 2002)    ED Course/ Medical Decision Making/ A&P                           Medical Decision Making Amount and/or Complexity of Data Reviewed Labs: ordered. Radiology: ordered.  Risk Prescription drug management. Decision regarding hospitalization.   This patient presents to the ED for concern of abd pain, n/v/d, this involves an extensive number of treatment options, and is a complaint that carries with it a high risk of complications and morbidity.  The differential diagnosis includes intraabdominal infection, covid/flu, electrolyte abn   Co morbidities that complicate the patient evaluation   HLD, GERD, tobacco use disorder, and MDD    Additional history obtained:  Additional history obtained from epic chart review External records from outside source obtained and reviewed including EMS report   Lab Tests:  I Ordered, and personally interpreted labs.  The pertinent results include:  cbc nl, cmp nl other than glucose slightly elevated at 186, lip nl, lactic nl, mg nl; covid/flu neg, trop nl   Imaging Studies ordered:  I ordered imaging studies including cxr and ct abd/pelvis  I independently visualized and interpreted imaging which showed  CXR: No active disease.  CT abd/pelvis:  Multiple dilated and thick-walled loops of small bowel are  clustered within the pelvis with internal hernia neck in the left  pelvis, findings are consistent with small bowel obstruction  secondary to internal hernia. Engorgement of the mesenteric vessels  and small bowel wall thickening, raises concern for ischemia. No  evidence of pneumatosis or free air.  2. Trace free fluid of the right hemiabdomen.  3. Aortic  Atherosclerosis (ICD10-I70.0).   I agree with the radiologist interpretation   Cardiac Monitoring:  The patient was maintained on a cardiac monitor.  I personally viewed and interpreted the cardiac monitored which showed an underlying rhythm of: SB   Medicines ordered and prescription drug management:  I ordered medication including ivfs  for dehydration; atropine for bradycardia  Reevaluation of the patient after these medicines showed that the patient improved I have reviewed the patients home medicines and have made adjustments as needed   Test Considered:  ct   Critical Interventions:  atropine   Consultations Obtained:  I requested consultation with the surgeon (Dr. Brantley Stage),  and discussed lab and imaging  findings as well as pertinent plan - he will see pt in consult Pt d/w Dr. Roel Cluck ([facility]) who will admit   Problem List / ED Course:  Hypothermia: Bair hugger applied.  Unclear etiology. ? From abdominal process Bradycardia:  pt is symptomatic (less responsive, diaphoretic, but bp ok), so she was given atropine.  HR did respond and has been fine since then. Abd pain + vomiting:  this is due to SBO from internal hernia.  Surgery recommends NG.  They will see pt in consult.   Reevaluation:  After the interventions noted above, I reevaluated the patient and found that they have :improved   Social Determinants of Health:  Lives at home   Dispostion:  After consideration of the diagnostic results and the patients response to treatment, I feel that the patent would benefit from admission.    CRITICAL CARE Performed by: Isla Pence   Total critical care time: 30 minutes  Critical care time was exclusive of separately billable procedures and treating other patients.  Critical care was necessary to treat or prevent imminent or life-threatening deterioration.  Critical care was time spent personally by me on the following activities: development of  treatment plan with patient and/or surrogate as well as nursing, discussions with consultants, evaluation of patient's response to treatment, examination of patient, obtaining history from patient or surrogate, ordering and performing treatments and interventions, ordering and review of laboratory studies, ordering and review of radiographic studies, pulse oximetry and re-evaluation of patient's condition.         Final Clinical Impression(s) / ED Diagnoses Final diagnoses:  Small bowel obstruction ([place])  Internal hernia  Hypothermia, initial encounter  Bradycardia    Rx / DC Orders ED Discharge Orders     None         Isla Pence, MD 07/13/22 2130

## 2022-07-13 NOTE — Assessment & Plan Note (Signed)
Protonix 40 mg IV

## 2022-07-13 NOTE — H&P (Signed)
[name] JJO:[id number] DOB: 1955-11-22 DOA: 07/13/2022     PCP: Idamae Schuller, MD      Patient arrived to ER on 07/13/22 at 1805 Referred by Attending Isla Pence, MD   Patient coming from:    home Lives With family    Chief Complaint:   Chief Complaint  Patient presents with   Abdominal Pain    HPI: [name] is a 66 y.o. female with medical history significant of HLD    Presented with abdominal pain nausea vomiting Patient came from nausea complaining of nausea fever chills weakness started last night no associated diarrhea but has been having significant abdominal pain On arrival noted to be hypotensive tensive hypothermic and bradycardic down to 30s atropine was administered   Drinks ETOH on occasion, smokes marejuanna   Initial COVID TEST  NEGATIVE   Lab Results  Component Value Date   [place] NEGATIVE 07/13/2022     Regarding pertinent Chronic problems:     Hyperlipidemia - on statins Crestor Lipid Panel     Component Value Date/Time   CHOL 272 (H) 04/12/2022 1502   TRIG 138 04/12/2022 1502   HDL 73 04/12/2022 1502   CHOLHDL 3.7 04/12/2022 1502   CHOLHDL 4.7 03/21/2011 1425   VLDL 48 (H) 03/21/2011 1425   LDLCALC 175 (H) 04/12/2022 1502   LABVLDL 24 04/12/2022 1502      While in ER:    CT showed obstruction  CXR - NON acute  CTabd/pelvis -   small bowel obstruction  secondary to internal hernia. Engorgement of the mesenteric vessels  and small bowel wall thickening, raises concern for ischemia      Following Medications were ordered in ER: Medications  morphine (PF) 4 MG/ML injection 4 mg (has no administration in time range)  Benzocaine (HURRCAINE) 20 % mouth spray (has no administration in time range)  sodium chloride 0.9 % bolus 1,000 mL (0 mLs Intravenous Stopped 07/13/22 1924)  ondansetron (ZOFRAN) injection 4 mg (4 mg Intravenous Given 07/13/22 1834)  atropine 1 MG/10ML injection 1 mg (1 mg Intravenous Given  07/13/22 1845)  iohexol (OMNIPAQUE) 300 MG/ML solution 100 mL (100 mLs Intravenous Contrast Given 07/13/22 2002)    _______________________________________________________ ER Provider Called:     Dr. Malachi Paradise  They Recommend admit to medicine  recommends NG. They will see pt in consult.    ED Triage Vitals  Enc Vitals Group     BP 07/13/22 1813 (!) 132/91     Pulse Rate 07/13/22 1813 (!) 56     Resp 07/13/22 1813 16     Temp 07/13/22 1813 (!) 94.5 F (34.7 C)     Temp Source 07/13/22 1813 Rectal     SpO2 07/13/22 1813 100 %     Weight --      Height --      Head Circumference --      Peak Flow --      Pain Score 07/13/22 1812 10     Pain Loc --      Pain Edu? --      Excl. in [place]? --   TMAX(24)@     _________________________________________ Significant initial  Findings: Abnormal Labs Reviewed  CBC WITH DIFFERENTIAL/PLATELET - Abnormal; Notable for the following components:      Result Value   Hemoglobin 15.2 (*)    All other components within normal limits  COMPREHENSIVE METABOLIC PANEL - Abnormal; Notable for the following components:   CO2 21 (*)  Glucose, Bld 186 (*)    Total Protein 8.4 (*)    All other components within normal limits  URINALYSIS, ROUTINE W REFLEX MICROSCOPIC - Abnormal; Notable for the following components:   Ketones, ur 5 (*)    Leukocytes,Ua TRACE (*)    Bacteria, UA RARE (*)    All other components within normal limits  CBG MONITORING, ED - Abnormal; Notable for the following components:   Glucose-Capillary 181 (*)    All other components within normal limits     _________________________ Troponin 2 ECG: Ordered Personally reviewed and interpreted by me showing: HR : 45 Rhythm:  Sinus bradycardia    nonspecific changes,  QTC 514    The recent clinical data is shown below. Vitals:   07/13/22 1915 07/13/22 1930 07/13/22 2045 07/13/22 2048  BP: (!) 149/81 (!) 151/84 (!) 131/115   Pulse: 61 (!) 58 61   Resp: 17 (!) 21 (!) 23   Temp:     (!) 94.7 F (34.8 C)  TempSrc:    Rectal  SpO2: 100% 100% 100%        WBC     Component Value Date/Time   WBC 4.5 07/13/2022 1837   LYMPHSABS 1.1 07/13/2022 1837   MONOABS 0.3 07/13/2022 1837   EOSABS 0.1 07/13/2022 1837   BASOSABS 0.0 07/13/2022 1837     Lactic Acid, Venous    Component Value Date/Time   LATICACIDVEN 1.6 07/13/2022 1845      UA   no evidence of UTI      Urine analysis:    Component Value Date/Time   COLORURINE YELLOW 07/13/2022 2050   APPEARANCEUR CLEAR 07/13/2022 2050   LABSPEC 1.017 07/13/2022 2050   PHURINE 7.0 07/13/2022 2050   GLUCOSEU NEGATIVE 07/13/2022 2050   [place] NEGATIVE 07/13/2022 2050   HGBUR negative 03/23/2010 0858   [place] NEGATIVE 07/13/2022 2050   KETONESUR 5 (A) 07/13/2022 2050   PROTEINUR NEGATIVE 07/13/2022 2050   UROBILINOGEN 0.2 03/23/2010 0858   NITRITE NEGATIVE 07/13/2022 2050   LEUKOCYTESUR TRACE (A) 07/13/2022 2050    Results for orders placed or performed during the hospital encounter of 07/13/22  Resp panel by RT-PCR (RSV, Flu A&B, Covid) Anterior Nasal Swab     Status: None   Collection Time: 07/13/22  6:58 PM   Specimen: Anterior Nasal Swab  Result Value Ref Range Status   SARS Coronavirus 2 by RT PCR NEGATIVE NEGATIVE Final         Influenza A by PCR NEGATIVE NEGATIVE Final   Influenza B by PCR NEGATIVE NEGATIVE Final         Resp Syncytial Virus by PCR NEGATIVE NEGATIVE Final          _______________________________________________ Hospitalist was called for admission for   Small bowel obstruction    Internal hernia  Hypothermia, initial encounter  Bradycardia    The following Work up has been ordered so far:  Orders Placed This Encounter  Procedures   Culture, blood (routine x 2)   Resp panel by RT-PCR (RSV, Flu A&B, Covid) Anterior Nasal Swab   DG Chest Portable 1 View   CT ABDOMEN PELVIS W CONTRAST   CBC with Differential   Comprehensive metabolic panel   Lipase, blood   Urinalysis,  Routine w reflex microscopic   Magnesium   Lactic acid, plasma   Cardiac monitoring   Apply warming blanket (Bair Hugger)   Notify physician (specific to NG/OG)   NGT - Nasogastric Tube; Insert tube, Maintain tube  in place   Flush NG/OG   Order port ABD x-ray ([id number]) to confirm tube placement prior to usage.   Consult to general surgery   Consult to hospitalist   POC CBG, ED   I-stat chem 8, ED   ED EKG   EKG 12-Lead   EKG 12-Lead   Insert peripheral IV     OTHER Significant initial  Findings:  labs showing:    Recent Labs  Lab 07/13/22 1837  NA 139  K 3.6  CO2 21*  GLUCOSE 186*  BUN 15  CREATININE 0.98  CALCIUM 10.2  MG 2.1    Cr   stable,    Lab Results  Component Value Date   CREATININE 0.98 07/13/2022   CREATININE 0.81 11/02/2021   CREATININE 0.89 05/31/2019    Recent Labs  Lab 07/13/22 1837  AST 26  ALT 25  ALKPHOS 68  BILITOT 1.0  PROT 8.4*  ALBUMIN 4.3   Lab Results  Component Value Date   CALCIUM 10.2 07/13/2022          Plt: Lab Results  Component Value Date   PLT 235 07/13/2022        Recent Labs  Lab 07/13/22 1837  WBC 4.5  NEUTROABS 3.0  HGB 15.2*  HCT 45.4  MCV 91.2  PLT 235    HG/HCT   stable,     Component Value Date/Time   HGB 15.2 (H) 07/13/2022 1837   HGB 13.6 07/13/2015 1116   HCT 45.4 07/13/2022 1837   HCT 41.3 07/13/2015 1116   MCV 91.2 07/13/2022 1837   MCV 92 07/13/2015 1116      Recent Labs  Lab 07/13/22 1837  LIPASE 29      Cardiac Panel (last 3 results) Recent Labs    07/13/22 2153  CKTOTAL 37*       CBG (last 3)  Recent Labs    07/13/22 1845  GLUCAP 181*          Cultures: No results found for: "SDES", "SPECREQUEST", "CULT", "REPTSTATUS"   Radiological Exams on Admission: CT ABDOMEN PELVIS W CONTRAST  Result Date: 07/13/2022 CLINICAL DATA:  Abdominal pain EXAM: CT ABDOMEN AND PELVIS WITH CONTRAST TECHNIQUE: Multidetector CT imaging of the abdomen and pelvis was  performed using the standard protocol following bolus administration of intravenous contrast. RADIATION DOSE REDUCTION: This exam was performed according to the departmental dose-optimization program which includes automated exposure control, adjustment of the mA and/or kV according to patient size and/or use of iterative reconstruction technique. CONTRAST:  172m OMNIPAQUE IOHEXOL 300 MG/ML  SOLN COMPARISON:  None Available. FINDINGS: Lower chest: No acute abnormality. Hepatobiliary: No focal liver abnormality is seen. Status post cholecystectomy. No biliary dilatation. Pancreas: Unremarkable. No pancreatic ductal dilatation or surrounding inflammatory changes. Spleen: Normal in size without focal abnormality. Adrenals/Urinary Tract: Bilateral adrenal glands are unremarkable. No hydronephrosis or nephrolithiasis. Bladder is unremarkable. Stomach/Bowel: Multiple dilated and thick-walled loops of small bowel are clustered with transition point in the left pelvis, on series 2, image 59 with decompressed distal small bowel exiting neck of the internal hernia which is best appreciated on coronal series 5, image 71, and upstream dilation of small bowel loops. Engorgement of the mesenteric vessels. Normal appendix. Vascular/Lymphatic: Aortic atherosclerosis. No enlarged abdominal or pelvic lymph nodes. Reproductive: No adnexal masses. Other:  Trace free fluid of the right hemiabdomen. Musculoskeletal: No acute or significant osseous findings. IMPRESSION: 1. Multiple dilated and thick-walled loops of small bowel are clustered within the pelvis with  internal hernia neck in the left pelvis, findings are consistent with small bowel obstruction secondary to internal hernia. Engorgement of the mesenteric vessels and small bowel wall thickening, raises concern for ischemia. No evidence of pneumatosis or free air. 2. Trace free fluid of the right hemiabdomen. 3. Aortic Atherosclerosis (ICD10-I70.0). Critical Value/emergent  results were called by telephone at the time of interpretation on 07/13/2022 at 8:28 pm to provider [facility] , who verbally acknowledged these results. Electronically Signed   By: Yetta Glassman M.D.   On: 07/13/2022 20:32   DG Chest Portable 1 View  Result Date: 07/13/2022 CLINICAL DATA:  Nausea vomiting EXAM: PORTABLE CHEST 1 VIEW COMPARISON:  07/23/2012 FINDINGS: The heart size and mediastinal contours are within normal limits. Aortic atherosclerosis. Both lungs are clear. The visualized skeletal structures are unremarkable. IMPRESSION: No active disease. Electronically Signed   By: Donavan Foil M.D.   On: 07/13/2022 19:15   _______________________________________________________________________________________________________ Latest  Blood pressure (!) 131/115, pulse 61, temperature (!) 94.7 F (34.8 C), temperature source Rectal, resp. rate (!) 23, last menstrual period 11/08/1991, SpO2 100 %.   Vitals  labs and radiology finding personally reviewed  Review of Systems:    Pertinent positives include:   Fevers, chills, fatigue  abdominal pain, nausea, vomiting, Constitutional:  No weight loss, night sweats,, weight loss  HEENT:  No headaches, Difficulty swallowing,Tooth/dental problems,Sore throat,  No sneezing, itching, ear ache, nasal congestion, post nasal drip,  Cardio-vascular:  No chest pain, Orthopnea, PND, anasarca, dizziness, palpitations.no Bilateral lower extremity swelling  GI:  No heartburn, indigestion, diarrhea, change in bowel habits, loss of appetite, melena, blood in stool, hematemesis Resp:  no shortness of breath at rest. No dyspnea on exertion, No excess mucus, no productive cough, No non-productive cough, No coughing up of blood.No change in color of mucus.No wheezing. Skin:  no rash or lesions. No jaundice GU:  no dysuria, change in color of urine, no urgency or frequency. No straining to urinate.  No flank pain.  Musculoskeletal:  No joint pain or  no joint swelling. No decreased range of motion. No back pain.  Psych:  No change in mood or affect. No depression or anxiety. No memory loss.  Neuro: no localizing neurological complaints, no tingling, no weakness, no double vision, no gait abnormality, no slurred speech, no confusion  All systems reviewed and apart from [place] all are negative _______________________________________________________________________________________________ Past Medical History:   Past Medical History:  Diagnosis Date   Back pain 07/07/2009   Bilateral knee pain 05/29/2012   Standing films did not reveal much joint space loss so I wonder if most of her arthritic changes are in the patellofemoral joint. She has received significant relief from corticosteroid injection. If we need to further investigate this would recommend MRI of the knee. Right knee seems worse than left.   Bradycardia 05/15/2006   Bursitis of right shoulder 05/10/2010   Depression 05/15/2006   Ecchymoses, spontaneous 08/12/2010   Fatigue 08/12/2010   GERD (gastroesophageal reflux disease) 05/15/2006   Inguinal pain 03/23/2010   right   Intertrigo 03/04/2014   Left hip pain 11/25/2019   Meralgia paresthetica of right side 05/08/2019   Mole (skin) 07/07/2009   Numbness and tingling of right arm 02/26/2018   Personal history of goiter 05/15/2006   Right hand pain 11/25/2020   Shoulder pain, right 07/07/2009   Smoker 05/10/2010   Tendinopathy of left rotator cuff 09/09/2016   Weight gain 08/12/2010     Past Surgical History:  Procedure Laterality Date   ABDOMINAL HYSTERECTOMY     CHOLECYSTECTOMY      Social History:  Ambulatory   independently     reports that she has been smoking cigarettes. She has been smoking an average of .1 packs per day. She has never used smokeless tobacco. She reports current alcohol use. She reports current drug use. Frequency: 1.00 time per week. Drug: Marijuana.    Family History:  Family History   Problem Relation Age of Onset   Diabetes Mother    Cancer Mother        Stomach   Diabetes Father    Kidney disease Father    Cancer Sister        Stomach   Stroke Brother    Cancer Brother        Stomach   Mental illness Sister    Cancer Daughter        skin   ________________________________ Allergies: Allergies  Allergen Reactions   Aspirin Nausea And Vomiting    Other Reaction(s): Unknown    Prior to Admission medications   Medication Sig Start Date End Date Taking? Authorizing Provider  acetaminophen (TYLENOL) 500 MG tablet Take 1 tablet (500 mg total) by mouth every 6 (six) hours as needed. 10/12/14   Juluis Mire, MD  cetirizine (ZYRTEC ALLERGY) 10 MG tablet Take 1 tablet (10 mg total) by mouth daily. 04/12/22 04/12/23  Idamae Schuller, MD  cholecalciferol (VITAMIN D3) 25 MCG (1000 UNIT) tablet Take 2 tablets (2,000 Units total) by mouth daily. 04/14/22 07/13/22  Idamae Schuller, MD  FLUoxetine (PROZAC) 40 MG capsule Take 80 mg by mouth daily.    [provider]  gabapentin (NEURONTIN) 300 MG capsule Take 2 capsules (600 mg total) by mouth 3 (three) times daily. TAKE 2 CAPSULES(600 MG) BY MOUTH THREE TIMES DAILY Strength: 300 mg 04/12/22 04/12/23  Idamae Schuller, MD  Multiple Vitamin (MULTIVITAMIN WITH MINERALS) TABS Take 1 tablet by mouth daily.    [provider]  omeprazole (PRILOSEC) 40 MG capsule Take 1 capsule (40 mg total) by mouth daily. 04/12/22 07/11/22  Idamae Schuller, MD  rosuvastatin (CRESTOR) 20 MG tablet Take 1 tablet (20 mg total) by mouth daily. 04/14/22 04/14/23  Idamae Schuller, MD    ________________________ Physical Exam:    07/13/2022    8:45 PM 07/13/2022    7:30 PM 07/13/2022    7:15 PM  Vitals with BMI  Systolic [phone]  Diastolic 916 84 81  Pulse 61 58 61     1. General:  in No  Acute distress   Chronically ill  -appearing 2. Psychological: Alert and   Oriented 3. Head/ENT:   Dry Mucous Membranes                          Head Non  traumatic, neck supple                           Poor Dentition 4. SKIN: decreased Skin turgor,  Skin clean Dry and intact no rash 5. Heart: Regular rate and rhythm  mild  Murmur, no Rub or gallop 6. Lungs:  no wheezes or crackles   7. Abdomen: Soft,  non-tender, Non distended  bowel sounds decreased 8. Lower extremities: no clubbing, cyanosis, no  edema 9. Neurologically  strength 5 out of 5 in all 4 extremities cranial nerves II through XII intact 10. MSK: Normal range of  motion    Chart has been reviewed  ______________________________________________________________________________________________  Assessment/Plan 66 y.o. female with medical history significant of HLD  Admitted for   Small bowel obstruction   Internal hernia  Hypothermia,  Bradycardia     Present on Admission:  SBO (small bowel obstruction) (HCC)  GERD  HLD (hyperlipidemia)  Hypothermia  Sinus bradycardia  Prolonged QT interval    SBO (small bowel obstruction) (HCC)  Likely cause internal hernia    - NG tube - NPO - KUB in AM - appreciate General surgery consult.  Given internal hernia may need further surgical intervention.  Lactic acid unremarkable abdominal pain now improved   GERD Protonix 40 mg IV  HLD (hyperlipidemia) Home medications on hold while n.p.o.  Hypothermia Etiology unclear check TSH cortisol level Lactic acid unremarkable no infectious source  Sinus bradycardia Seems to be in the setting of severe abdominal pain felt to be may be vasovagal.  Patient was given a dose of atropine with improvement continue to monitor obtain echogram check TSH no chest pain troponin within normal limits  Prolonged QT interval - will monitor on tele avoid QT prolonging medications, rehydrate correct electrolytes   Other plan as per orders.  DVT prophylaxis:  SCD     Code Status:    Code Status: Not on file FULL CODE as per patient   I had personally discussed CODE STATUS with patient     Family Communication:   Family    at  Bedside  plan of care was discussed  with   Daughter,    Disposition Plan:        To home once workup is complete and patient is stable   Following barriers for discharge:                                                        Pain controlled with PO medications                                                        Will need consultants to evaluate patient prior to discharge    Consults called: general surgery  Admission status:  ED Disposition     ED Disposition  Admit   Condition  --   [place]: [place] [[id number]]  Level of Care: Stepdown [14]  Admit to SDU based on following criteria: Hemodynamic compromise or significant risk of instability:  Patient requiring short term acute titration and management of vasoactive drips, and invasive monitoring (i.e., CVP and Arterial line).  May admit patient to [facility] or [facility] if equivalent level of care is available:: No  Covid Evaluation: Confirmed COVID Negative  Diagnosis: SBO (small bowel obstruction) [facility]) [[id number]]  Admitting Physician: Toy Baker [[id number]]  Attending Physician: Toy Baker [[id number]]  Certification:: I certify this patient will need inpatient services for at least 2 midnights  Estimated Length of Stay: 2           inpatient     I Expect 2 midnight stay secondary to severity of patient's current illness need for inpatient interventions justified by the following:  hemodynamic instability  despite optimal treatment (bradycardia  )   Severe lab/radiological/exam abnormalities including:    sbo    That are currently affecting medical management.   I expect  patient to be hospitalized for 2 midnights requiring inpatient medical care.  Patient is at high risk for adverse outcome (such as loss of life or disability) if not treated.  Indication for inpatient stay as follows:   Need for operative/procedural   intervention    Need for  IV fluids,   IV pain medications     Level of care stepdown indefinitely please discontinue once patient no longer qualifies COVID-19 Labs    Lab Results  Component Value Date   [place] 07/13/2022     Precautions: admitted as   Covid Negative   Ashad Fawbush 07/13/2022, 10:37 PM     [facility]     after 2 AM please page floor coverage PA If 7AM-7PM, please contact the day team taking care of the patient using [email or web address]   Patient was evaluated in the context of the global COVID-19 pandemic, which necessitated consideration that the patient might be at risk for infection with the SARS-CoV-2 virus that causes COVID-19. Institutional protocols and algorithms that pertain to the evaluation of patients at risk for COVID-19 are in a state of rapid change based on information released by regulatory bodies including the CDC and federal and state organizations. These policies and algorithms were followed during the patient's care.

## 2022-07-13 NOTE — Subjective & Objective (Signed)
Patient came from nausea complaining of nausea fever chills weakness started last night no associated diarrhea but has been having significant abdominal pain On arrival noted to be hypotensive tensive hypothermic and bradycardic down to 30s atropine was administered

## 2022-07-13 NOTE — ED Notes (Signed)
Verbal order '1mg'$  atropine IV by Dr. Gilford Raid. Given at 6:45pm.

## 2022-07-13 NOTE — Assessment & Plan Note (Signed)
-   will monitor on tele avoid QT prolonging medications, rehydrate correct electrolytes ? ?

## 2022-07-13 NOTE — Assessment & Plan Note (Signed)
Etiology unclear check TSH cortisol level Lactic acid unremarkable no infectious source

## 2022-07-14 ENCOUNTER — Inpatient Hospital Stay (HOSPITAL_COMMUNITY): Payer: Medicare Other

## 2022-07-14 ENCOUNTER — Encounter (HOSPITAL_COMMUNITY): Payer: Self-pay | Admitting: Internal Medicine

## 2022-07-14 DIAGNOSIS — K56609 Unspecified intestinal obstruction, unspecified as to partial versus complete obstruction: Secondary | ICD-10-CM | POA: Diagnosis not present

## 2022-07-14 DIAGNOSIS — R9431 Abnormal electrocardiogram [ECG] [EKG]: Secondary | ICD-10-CM

## 2022-07-14 LAB — CORTISOL-AM, BLOOD: Cortisol - AM: 63.4 ug/dL — ABNORMAL HIGH (ref 6.7–22.6)

## 2022-07-14 LAB — MRSA NEXT GEN BY PCR, NASAL: MRSA by PCR Next Gen: NOT DETECTED

## 2022-07-14 LAB — CBC
HCT: 44.6 % (ref 36.0–46.0)
Hemoglobin: 14.6 g/dL (ref 12.0–15.0)
MCH: 30.6 pg (ref 26.0–34.0)
MCHC: 32.7 g/dL (ref 30.0–36.0)
MCV: 93.5 fL (ref 80.0–100.0)
Platelets: 221 10*3/uL (ref 150–400)
RBC: 4.77 MIL/uL (ref 3.87–5.11)
RDW: 12.9 % (ref 11.5–15.5)
WBC: 6.9 10*3/uL (ref 4.0–10.5)
nRBC: 0 % (ref 0.0–0.2)

## 2022-07-14 LAB — COMPREHENSIVE METABOLIC PANEL
ALT: 22 U/L (ref 0–44)
AST: 23 U/L (ref 15–41)
Albumin: 3.3 g/dL — ABNORMAL LOW (ref 3.5–5.0)
Alkaline Phosphatase: 58 U/L (ref 38–126)
Anion gap: 10 (ref 5–15)
BUN: 14 mg/dL (ref 8–23)
CO2: 20 mmol/L — ABNORMAL LOW (ref 22–32)
Calcium: 9.1 mg/dL (ref 8.9–10.3)
Chloride: 109 mmol/L (ref 98–111)
Creatinine, Ser: 0.95 mg/dL (ref 0.44–1.00)
GFR, Estimated: >60 mL/min (ref >60)
Glucose, Bld: 179 mg/dL — ABNORMAL HIGH (ref 70–99)
Potassium: 3.8 mmol/L (ref 3.5–5.1)
Sodium: 139 mmol/L (ref 135–145)
Total Bilirubin: 0.7 mg/dL (ref 0.3–1.2)
Total Protein: 6.7 g/dL (ref 6.5–8.1)

## 2022-07-14 LAB — ECHOCARDIOGRAM COMPLETE
Area-P 1/2: 3.27 cm2
S' Lateral: 2.7 cm

## 2022-07-14 LAB — RAPID URINE DRUG SCREEN, HOSP PERFORMED
Amphetamines: NOT DETECTED
Barbiturates: NOT DETECTED
Benzodiazepines: NOT DETECTED
Cocaine: POSITIVE — AB
Opiates: NOT DETECTED
Tetrahydrocannabinol: POSITIVE — AB

## 2022-07-14 LAB — MAGNESIUM: Magnesium: 1.9 mg/dL (ref 1.7–2.4)

## 2022-07-14 LAB — PHOSPHORUS: Phosphorus: 4.5 mg/dL (ref 2.5–4.6)

## 2022-07-14 LAB — PREALBUMIN: Prealbumin: 26 mg/dL (ref 18–38)

## 2022-07-14 LAB — CORTISOL: Cortisol, Plasma: 63.4 ug/dL

## 2022-07-14 LAB — TROPONIN I (HIGH SENSITIVITY): Troponin I (High Sensitivity): <2 ng/L (ref <18)

## 2022-07-14 LAB — HIV ANTIBODY (ROUTINE TESTING W REFLEX): HIV Screen 4th Generation wRfx: NONREACTIVE

## 2022-07-14 MED ORDER — HYDROMORPHONE HCL 1 MG/ML IJ SOLN
0.5000 mg | Freq: Once | INTRAMUSCULAR | Status: AC
  Administered 2022-07-14: 0.5 mg via INTRAVENOUS
  Filled 2022-07-14: qty 1

## 2022-07-14 MED ORDER — SODIUM CHLORIDE 0.9 % IV SOLN: INTRAVENOUS | Status: DC

## 2022-07-14 MED ORDER — ACETAMINOPHEN 325 MG PO TABS: 650.0000 mg | ORAL_TABLET | Freq: Four times a day (QID) | ORAL | Status: DC | PRN

## 2022-07-14 MED ORDER — ORAL CARE MOUTH RINSE
15.0000 mL | OROMUCOSAL | Status: DC
  Administered 2022-07-14 – 2022-07-15 (×5): 15 mL via OROMUCOSAL

## 2022-07-14 MED ORDER — ACETAMINOPHEN 650 MG RE SUPP: 650.0000 mg | Freq: Four times a day (QID) | RECTAL | Status: DC | PRN

## 2022-07-14 MED ORDER — ORAL CARE MOUTH RINSE: 15.0000 mL | OROMUCOSAL | Status: DC | PRN

## 2022-07-14 MED ORDER — CHLORHEXIDINE GLUCONATE CLOTH 2 % EX PADS
6.0000 | MEDICATED_PAD | Freq: Every day | CUTANEOUS | Status: DC
  Administered 2022-07-14 – 2022-07-20 (×5): 6 via TOPICAL

## 2022-07-14 MED ORDER — HYDROMORPHONE HCL 1 MG/ML IJ SOLN
0.5000 mg | INTRAMUSCULAR | Status: DC | PRN
  Administered 2022-07-14: 1 mg via INTRAVENOUS
  Administered 2022-07-14: 0.5 mg via INTRAVENOUS
  Administered 2022-07-14 – 2022-07-15 (×4): 1 mg via INTRAVENOUS
  Filled 2022-07-14 (×6): qty 1

## 2022-07-14 MED ORDER — LACTATED RINGERS IV SOLN: INTRAVENOUS | Status: AC

## 2022-07-14 MED ORDER — HYDROCODONE-ACETAMINOPHEN 5-325 MG PO TABS: 1.0000 | ORAL_TABLET | ORAL | Status: DC | PRN

## 2022-07-14 MED ORDER — HYDRALAZINE HCL 20 MG/ML IJ SOLN
10.0000 mg | Freq: Once | INTRAMUSCULAR | Status: AC
  Administered 2022-07-14: 10 mg via INTRAVENOUS
  Filled 2022-07-14: qty 1

## 2022-07-14 NOTE — Progress Notes (Signed)
Echocardiogram 2D Echocardiogram has been performed.  Oneal Deputy Arlisha Patalano RDCS 07/14/2022, 8:37 AM

## 2022-07-14 NOTE — Plan of Care (Signed)

## 2022-07-14 NOTE — Progress Notes (Signed)
  Transition of Care [facility]) Screening Note   Patient Details  Name: [name] Date of Birth: [birth year]   Transition of Care [facility]) CM/SW Contact:    Roseanne Kaufman, RN Phone Number: 07/14/2022, 2:56 PM  TOC will follow for discharge needs.  Transition of Care Department [facility]) has reviewed patient and no TOC needs have been identified at this time. We will continue to monitor patient advancement through interdisciplinary progression rounds. If new patient transition needs arise, please place a TOC consult.

## 2022-07-14 NOTE — Progress Notes (Signed)
Ng tube advanced per radiology recommendation to 58 cm.

## 2022-07-14 NOTE — Progress Notes (Signed)
PROGRESS NOTE    [name]  YTK:[id number] DOB: 04/11/1956 DOA: 07/13/2022 PCP: Idamae Schuller, MD     Brief Narrative:  H/o abdominal hysterectomy presents with ab pain, n/v, found to have sbo,has hypothermia, ng placed, admitted to progressive bed on warming blanket    Subjective:  She is very weak, reports feeling thirsty, currently no ab pain, no bm, she could not tell is if any flatus   She is tolerating ng suction, on ivf  Assessment & Plan:  Principal Problem:   SBO (small bowel obstruction) (HCC) Active Problems:   GERD   Sinus bradycardia   HLD (hyperlipidemia)   Hypothermia   Prolonged QT interval   Sbo Cletus Gash /ivf Appreciate gen surg input  Hypothermia Blood culture no growth Cortisol level elevated   Sinus bradycardia Tsh 0.6 Resolved, echocardiogram no acute findings,  monitor   Qtc 490 Keep K>4, mag >2 Repeat EKG in am  Impaired fasting blood glucose Denies h/o DM2, possible due to stress   UDS+ cocaine, THC   .   I have Reviewed nursing notes, Vitals, pain scores, I/o's, Lab results and  imaging results since pt's last encounter, details please see discussion above  I ordered the following labs:  Unresulted Labs (From admission, onward)     Start     Ordered   07/15/22 0500  CBC with Differential/Platelet  Tomorrow morning,   R       Question:  Specimen collection method  Answer:  Lab=Lab collect   07/14/22 0849   07/15/22 5573  Basic metabolic panel  Tomorrow morning,   R       Question:  Specimen collection method  Answer:  Lab=Lab collect   07/14/22 0849   07/15/22 0500  Magnesium  Tomorrow morning,   R       Question:  Specimen collection method  Answer:  Lab=Lab collect   07/14/22 0849   07/15/22 0500  Hemoglobin A1c  Tomorrow morning,   R       Question:  Specimen collection method  Answer:  Lab=Lab collect   07/14/22 0849             DVT prophylaxis: SCDs Start: 07/14/22 0009   Code Status:   Code Status: Full  Code  Family Communication: none at bedside Disposition:    Dispo: The patient is from: home              Anticipated d/c is to: home              Anticipated d/c date is: >24hrs  Antimicrobials:   Anti-infectives (From admission, onward)    None           Objective: Vitals:   07/14/22 0700 07/14/22 0800 07/14/22 0900 07/14/22 1000  BP: (!) 145/71 137/73 133/76 113/83  Pulse: 83 79  92  Resp: (!) 31 (!) 26 (!) 26 20  Temp:  (!) 97.3 F (36.3 C)    TempSrc:  Oral    SpO2: 94% 93%  90%    Intake/Output Summary (Last 24 hours) at 07/14/2022 1140 Last data filed at 07/14/2022 1000 Gross per 24 hour  Intake 924.84 ml  Output --  Net 924.84 ml   There were no vitals filed for this visit.  Examination:  General exam: alert, awakex3 but very weak Respiratory system: Clear to auscultation. Respiratory effort normal. Cardiovascular system:  RRR.  Gastrointestinal system: Abdomen is nondistended, soft and nontender.  Hypoactive  bowel sounds heard. Central  nervous system: Alert and oriented. No focal neurological deficits. Extremities:  no edema Skin: No rashes, lesions or ulcers Psychiatry: flat    Data Reviewed: I have personally reviewed  labs and visualized  imaging studies since the last encounter and formulate the plan        Scheduled Meds:  Chlorhexidine Gluconate Cloth  6 each Topical Daily   mouth rinse  15 mL Mouth Rinse 4 times per day   Continuous Infusions:   LOS: 1 day     Florencia Reasons, MD PhD FACP [facility]  Available via Epic secure chat 7am-7pm for nonurgent issues Please page for urgent issues To page the attending provider between 7A-7P or the covering provider during after hours 7P-7A, please log into the web site [email or web address] and access using universal [place] password for that web site. If you do not have the password, please call the hospital operator.    07/14/2022, 11:40 AM

## 2022-07-14 NOTE — Progress Notes (Signed)
Progress Note     Subjective: Alert and oriented this morning and able to recall the events of yesterday in the hospital but does not remember events leading up to ED presentation. Abdominal pain is her only complaint and she says it is better than yesterday. Last BM was maybe Monday or Tuesday. Not passing flatus today. No nausea.    Objective: Vital signs in last 24 hours: Temp:  [94.5 F (34.7 C)-96.6 F (35.9 C)] 96.6 F (35.9 C) (12/21 0400) Pulse Rate:  [38-83] 83 (12/21 0700) Resp:  [16-31] 31 (12/21 0700) BP: (131-192)/(73-115) 167/92 (12/21 0600) SpO2:  [94 %-100 %] 94 % (12/21 0700) Last BM Date : 07/12/22 (PTA)  Intake/Output from previous day: No intake/output data recorded. Intake/Output this shift: No intake/output data recorded.  PE: General: pleasant, WD, female who is laying in bed in NAD Heart: regular, rate, and rhythm.   Lungs: Respiratory effort nonlabored Abd: soft, ND, moderate TTP in LLQ without rebound or guarding. Not peritonitic. NGT with 300 ml light clear bilious fluid. MSK: all 4 extremities are symmetrical with no cyanosis, clubbing, or edema. Skin: warm and dry Psych: A&Ox3 with an appropriate affect.    Lab Results:  Recent Labs    07/13/22 1837 07/14/22 0043  WBC 4.5 6.9  HGB 15.2* 14.6  HCT 45.4 44.6  PLT 235 221   BMET Recent Labs    07/13/22 1837 07/14/22 0043  NA 139 139  K 3.6 3.8  CL 104 109  CO2 21* 20*  GLUCOSE 186* 179*  BUN 15 14  CREATININE 0.98 0.95  CALCIUM 10.2 9.1   PT/INR Recent Labs    07/13/22 2153  LABPROT 13.3  INR 1.0   CMP     Component Value Date/Time   NA 139 07/14/2022 0043   NA 139 11/02/2021 1535   K 3.8 07/14/2022 0043   CL 109 07/14/2022 0043   CO2 20 (L) 07/14/2022 0043   GLUCOSE 179 (H) 07/14/2022 0043   BUN 14 07/14/2022 0043   BUN 9 11/02/2021 1535   CREATININE 0.95 07/14/2022 0043   CREATININE 0.84 05/03/2011 1642   CALCIUM 9.1 07/14/2022 0043   PROT 6.7 07/14/2022  0043   PROT 6.9 02/26/2018 1359   ALBUMIN 3.3 (L) 07/14/2022 0043   ALBUMIN 4.4 02/26/2018 1359   AST 23 07/14/2022 0043   ALT 22 07/14/2022 0043   ALKPHOS 58 07/14/2022 0043   BILITOT 0.7 07/14/2022 0043   BILITOT 0.4 02/26/2018 1359   GFRNONAA >60 07/14/2022 0043   GFRNONAA >60 05/03/2011 1642   GFRAA >60 05/31/2019 1632   GFRAA >60 05/03/2011 1642   Lipase     Component Value Date/Time   LIPASE 29 07/13/2022 1837       Studies/Results: DG Abd Portable 1V-Small Bowel Protocol-Position Verification  Result Date: 07/13/2022 CLINICAL DATA:  NG tube EXAM: PORTABLE ABDOMEN - 1 VIEW COMPARISON:  Abdominal x-ray 07/13/2022 FINDINGS: Nasogastric tube tip is now in the proximal body of the stomach. No dilated bowel loops are seen. IMPRESSION: Nasogastric tube tip in the proximal body of the stomach. Electronically Signed   By: Ronney Asters M.D.   On: 07/13/2022 22:36   DG Abd Portable 1V-Small Bowel Protocol-Position Verification  Result Date: 07/13/2022 CLINICAL DATA:  NG tube EXAM: PORTABLE ABDOMEN - 1 VIEW COMPARISON:  None Available. FINDINGS: Nasogastric tube tip is in the gastric fundus with sidehole at the level of the gastroesophageal junction. There is some gaseous distention of bowel in the  central abdomen. There is residual contrast in the renal collecting systems. Cholecystectomy clips are present. Lung bases are clear. IMPRESSION: Nasogastric tube tip is in the gastric fundus with sidehole at the level of the gastroesophageal junction. Recommend advancing tube. Electronically Signed   By: Ronney Asters M.D.   On: 07/13/2022 22:09   CT ABDOMEN PELVIS W CONTRAST  Result Date: 07/13/2022 CLINICAL DATA:  Abdominal pain EXAM: CT ABDOMEN AND PELVIS WITH CONTRAST TECHNIQUE: Multidetector CT imaging of the abdomen and pelvis was performed using the standard protocol following bolus administration of intravenous contrast. RADIATION DOSE REDUCTION: This exam was performed according  to the departmental dose-optimization program which includes automated exposure control, adjustment of the mA and/or kV according to patient size and/or use of iterative reconstruction technique. CONTRAST:  165m OMNIPAQUE IOHEXOL 300 MG/ML  SOLN COMPARISON:  None Available. FINDINGS: Lower chest: No acute abnormality. Hepatobiliary: No focal liver abnormality is seen. Status post cholecystectomy. No biliary dilatation. Pancreas: Unremarkable. No pancreatic ductal dilatation or surrounding inflammatory changes. Spleen: Normal in size without focal abnormality. Adrenals/Urinary Tract: Bilateral adrenal glands are unremarkable. No hydronephrosis or nephrolithiasis. Bladder is unremarkable. Stomach/Bowel: Multiple dilated and thick-walled loops of small bowel are clustered with transition point in the left pelvis, on series 2, image 59 with decompressed distal small bowel exiting neck of the internal hernia which is best appreciated on coronal series 5, image 71, and upstream dilation of small bowel loops. Engorgement of the mesenteric vessels. Normal appendix. Vascular/Lymphatic: Aortic atherosclerosis. No enlarged abdominal or pelvic lymph nodes. Reproductive: No adnexal masses. Other:  Trace free fluid of the right hemiabdomen. Musculoskeletal: No acute or significant osseous findings. IMPRESSION: 1. Multiple dilated and thick-walled loops of small bowel are clustered within the pelvis with internal hernia neck in the left pelvis, findings are consistent with small bowel obstruction secondary to internal hernia. Engorgement of the mesenteric vessels and small bowel wall thickening, raises concern for ischemia. No evidence of pneumatosis or free air. 2. Trace free fluid of the right hemiabdomen. 3. Aortic Atherosclerosis (ICD10-I70.0). Critical Value/emergent results were called by telephone at the time of interpretation on 07/13/2022 at 8:28 pm to [facility], who verbally acknowledged these results.  Electronically Signed   By: LYetta GlassmanM.D.   On: 07/13/2022 20:32   DG Chest Portable 1 View  Result Date: 07/13/2022 CLINICAL DATA:  Nausea vomiting EXAM: PORTABLE CHEST 1 VIEW COMPARISON:  07/23/2012 FINDINGS: The heart size and mediastinal contours are within normal limits. Aortic atherosclerosis. Both lungs are clear. The visualized skeletal structures are unremarkable. IMPRESSION: No active disease. Electronically Signed   By: KDonavan FoilM.D.   On: 07/13/2022 19:15    Anti-infectives: Anti-infectives (From admission, onward)    None        Assessment/Plan SBO ?internal hernia - CT on admission with question of internal hernia - WBC and lactic acid w/ repeat normal on admit - history of abdominal hysterectomy and likely open cholecystectomy - NGT placed and protocol started. 8 hour film without contrast in the colon. NGT not well seen on film and adjusted bedside and inserted further. - continue npo/NGT LIWS - abdominal exam remains reassuring this am and WBC WNL. Continue conservative management for now but will follow closely - Hopefully she will improve with conservative management. If she fails to improve or acutely worsens she may require exploratory surgery during admission  FEN: NPO/NGT, IVF ID: none VTE: okay for chemical ppx from surgical perspective  Per primary AMS/hypothermia/bradycardia on  admit  GERD HLD   I reviewed hospitalist notes, last 24 h vitals and pain scores, last 48 h intake and output, last 24 h labs and trends, and last 24 h imaging results.    LOS: 1 day   [facility] 07/14/2022, 7:34 AM Please see Amion for pager number during day hours 7:00am-4:30pm

## 2022-07-15 ENCOUNTER — Inpatient Hospital Stay (HOSPITAL_COMMUNITY): Payer: Medicare Other | Admitting: Anesthesiology

## 2022-07-15 ENCOUNTER — Encounter (HOSPITAL_COMMUNITY): Payer: Self-pay | Admitting: Internal Medicine

## 2022-07-15 ENCOUNTER — Inpatient Hospital Stay (HOSPITAL_COMMUNITY): Payer: Medicare Other

## 2022-07-15 ENCOUNTER — Encounter (HOSPITAL_COMMUNITY)
Admission: EM | Disposition: A | Discharge status: Home Health | Payer: Self-pay | Source: Home / Self Care | Attending: Internal Medicine

## 2022-07-15 DIAGNOSIS — N289 Disorder of kidney and ureter, unspecified: Secondary | ICD-10-CM

## 2022-07-15 DIAGNOSIS — K559 Vascular disorder of intestine, unspecified: Secondary | ICD-10-CM

## 2022-07-15 DIAGNOSIS — N736 Female pelvic peritoneal adhesions (postinfective): Secondary | ICD-10-CM

## 2022-07-15 DIAGNOSIS — E785 Hyperlipidemia, unspecified: Secondary | ICD-10-CM

## 2022-07-15 DIAGNOSIS — K56609 Unspecified intestinal obstruction, unspecified as to partial versus complete obstruction: Secondary | ICD-10-CM | POA: Diagnosis not present

## 2022-07-15 HISTORY — PX: LAPAROTOMY: SHX154

## 2022-07-15 LAB — BLOOD GAS, ARTERIAL
Acid-base deficit: 1.7 mmol/L (ref 0.0–2.0)
Bicarbonate: 22.1 mmol/L (ref 20.0–28.0)
FIO2: 100 %
MECHVT: 470 mL
O2 Saturation: 99.5 %
PEEP: 5 cmH2O
Patient temperature: 36.6
RATE: 18 resp/min
pCO2 arterial: 33 mmHg (ref 32–48)
pH, Arterial: 7.43 (ref 7.35–7.45)
pO2, Arterial: 133 mmHg — ABNORMAL HIGH (ref 83–108)

## 2022-07-15 LAB — CBC WITH DIFFERENTIAL/PLATELET
Abs Immature Granulocytes: 0.07 10*3/uL (ref 0.00–0.07)
Basophils Absolute: 0 10*3/uL (ref 0.0–0.1)
Basophils Relative: 0 %
Eosinophils Absolute: 0 10*3/uL (ref 0.0–0.5)
Eosinophils Relative: 0 %
HCT: 40.9 % (ref 36.0–46.0)
Hemoglobin: 12.9 g/dL (ref 12.0–15.0)
Immature Granulocytes: 1 %
Lymphocytes Relative: 5 %
Lymphs Abs: 0.8 10*3/uL (ref 0.7–4.0)
MCH: 30.7 pg (ref 26.0–34.0)
MCHC: 31.5 g/dL (ref 30.0–36.0)
MCV: 97.4 fL (ref 80.0–100.0)
Monocytes Absolute: 1.5 10*3/uL — ABNORMAL HIGH (ref 0.1–1.0)
Monocytes Relative: 10 %
Neutro Abs: 12.2 10*3/uL — ABNORMAL HIGH (ref 1.7–7.7)
Neutrophils Relative %: 84 %
Platelets: 209 10*3/uL (ref 150–400)
RBC: 4.2 MIL/uL (ref 3.87–5.11)
RDW: 13.3 % (ref 11.5–15.5)
WBC: 14.6 10*3/uL — ABNORMAL HIGH (ref 4.0–10.5)
nRBC: 0 % (ref 0.0–0.2)

## 2022-07-15 LAB — BASIC METABOLIC PANEL
Anion gap: 10 (ref 5–15)
BUN: 27 mg/dL — ABNORMAL HIGH (ref 8–23)
CO2: 20 mmol/L — ABNORMAL LOW (ref 22–32)
Calcium: 8.5 mg/dL — ABNORMAL LOW (ref 8.9–10.3)
Chloride: 109 mmol/L (ref 98–111)
Creatinine, Ser: 1.23 mg/dL — ABNORMAL HIGH (ref 0.44–1.00)
GFR, Estimated: 48 mL/min — ABNORMAL LOW (ref >60)
Glucose, Bld: 163 mg/dL — ABNORMAL HIGH (ref 70–99)
Potassium: 4.1 mmol/L (ref 3.5–5.1)
Sodium: 139 mmol/L (ref 135–145)

## 2022-07-15 LAB — GLUCOSE, CAPILLARY
Glucose-Capillary: 178 mg/dL — ABNORMAL HIGH (ref 70–99)
Glucose-Capillary: 106 mg/dL — ABNORMAL HIGH (ref 70–99)

## 2022-07-15 LAB — TYPE AND SCREEN
ABO/RH(D): B POS
Antibody Screen: NEGATIVE

## 2022-07-15 LAB — LACTIC ACID, PLASMA
Lactic Acid, Venous: 1.7 mmol/L (ref 0.5–1.9)
Lactic Acid, Venous: 1.7 mmol/L (ref 0.5–1.9)

## 2022-07-15 LAB — MAGNESIUM: Magnesium: 1.8 mg/dL (ref 1.7–2.4)

## 2022-07-15 SURGERY — LAPAROTOMY, EXPLORATORY
Anesthesia: General | Site: Abdomen

## 2022-07-15 MED ORDER — HYDROMORPHONE HCL 1 MG/ML IJ SOLN
1.0000 mg | INTRAMUSCULAR | Status: DC | PRN
  Administered 2022-07-15 – 2022-07-17 (×11): 1 mg via INTRAVENOUS
  Filled 2022-07-15 (×11): qty 1

## 2022-07-15 MED ORDER — METRONIDAZOLE 500 MG/100ML IV SOLN
500.0000 mg | Freq: Two times a day (BID) | INTRAVENOUS | Status: DC
  Administered 2022-07-15 – 2022-07-21 (×12): 500 mg via INTRAVENOUS
  Filled 2022-07-15 (×12): qty 100

## 2022-07-15 MED ORDER — FENTANYL BOLUS VIA INFUSION
25.0000 ug | INTRAVENOUS | Status: DC | PRN
  Administered 2022-07-15: 50 ug via INTRAVENOUS

## 2022-07-15 MED ORDER — DEXAMETHASONE SODIUM PHOSPHATE 10 MG/ML IJ SOLN
INTRAMUSCULAR | Status: AC
  Filled 2022-07-15: qty 1

## 2022-07-15 MED ORDER — PHENYLEPHRINE HCL (PRESSORS) 10 MG/ML IV SOLN
INTRAVENOUS | Status: AC
  Filled 2022-07-15: qty 1

## 2022-07-15 MED ORDER — ORAL CARE MOUTH RINSE: 15.0000 mL | OROMUCOSAL | Status: DC | PRN

## 2022-07-15 MED ORDER — CHLORHEXIDINE GLUCONATE 0.12 % MT SOLN
15.0000 mL | Freq: Once | OROMUCOSAL | Status: AC
  Administered 2022-07-15: 15 mL via OROMUCOSAL

## 2022-07-15 MED ORDER — NALOXONE HCL 0.4 MG/ML IJ SOLN: 0.4000 mg | INTRAMUSCULAR | Status: DC | PRN

## 2022-07-15 MED ORDER — PHENYLEPHRINE HCL-NACL 20-0.9 MG/250ML-% IV SOLN
INTRAVENOUS | Status: DC | PRN
  Administered 2022-07-15: 80 ug/min via INTRAVENOUS

## 2022-07-15 MED ORDER — PROPOFOL 500 MG/50ML IV EMUL
INTRAVENOUS | Status: AC
  Filled 2022-07-15: qty 50

## 2022-07-15 MED ORDER — PROPOFOL 500 MG/50ML IV EMUL
INTRAVENOUS | Status: DC | PRN
  Administered 2022-07-15: 30 ug/kg/min via INTRAVENOUS

## 2022-07-15 MED ORDER — CEFAZOLIN SODIUM-DEXTROSE 2-4 GM/100ML-% IV SOLN
2.0000 g | INTRAVENOUS | Status: AC
  Administered 2022-07-15: 2 g via INTRAVENOUS
  Filled 2022-07-15: qty 100

## 2022-07-15 MED ORDER — ROCURONIUM BROMIDE 10 MG/ML (PF) SYRINGE
PREFILLED_SYRINGE | INTRAVENOUS | Status: DC | PRN
  Administered 2022-07-15: 20 mg via INTRAVENOUS
  Administered 2022-07-15: 60 mg via INTRAVENOUS

## 2022-07-15 MED ORDER — VASOPRESSIN 20 UNIT/ML IV SOLN
INTRAVENOUS | Status: DC | PRN
  Administered 2022-07-15: 1 [IU] via INTRAVENOUS

## 2022-07-15 MED ORDER — CHLORHEXIDINE GLUCONATE CLOTH 2 % EX PADS
6.0000 | MEDICATED_PAD | Freq: Once | CUTANEOUS | Status: AC
  Administered 2022-07-15: 6 via TOPICAL

## 2022-07-15 MED ORDER — VASOPRESSIN 20 UNIT/ML IV SOLN
INTRAVENOUS | Status: AC
  Filled 2022-07-15: qty 1

## 2022-07-15 MED ORDER — LIDOCAINE HCL (CARDIAC) PF 100 MG/5ML IV SOSY
PREFILLED_SYRINGE | INTRAVENOUS | Status: DC | PRN
  Administered 2022-07-15: 60 mg via INTRAVENOUS

## 2022-07-15 MED ORDER — ORAL CARE MOUTH RINSE
15.0000 mL | OROMUCOSAL | Status: DC
  Administered 2022-07-15 (×2): 15 mL via OROMUCOSAL

## 2022-07-15 MED ORDER — 0.9 % SODIUM CHLORIDE (POUR BTL) OPTIME
TOPICAL | Status: DC | PRN
  Administered 2022-07-15: 2000 mL

## 2022-07-15 MED ORDER — FENTANYL CITRATE (PF) 250 MCG/5ML IJ SOLN
INTRAMUSCULAR | Status: AC
  Filled 2022-07-15: qty 5

## 2022-07-15 MED ORDER — LACTATED RINGERS IV SOLN: INTRAVENOUS | Status: AC

## 2022-07-15 MED ORDER — LACTATED RINGERS IV SOLN: INTRAVENOUS | Status: DC

## 2022-07-15 MED ORDER — ALBUMIN HUMAN 5 % IV SOLN
INTRAVENOUS | Status: AC
  Filled 2022-07-15: qty 250

## 2022-07-15 MED ORDER — PHENYLEPHRINE 80 MCG/ML (10ML) SYRINGE FOR IV PUSH (FOR BLOOD PRESSURE SUPPORT)
PREFILLED_SYRINGE | INTRAVENOUS | Status: AC
  Filled 2022-07-15: qty 10

## 2022-07-15 MED ORDER — MIDAZOLAM HCL 2 MG/2ML IJ SOLN
INTRAMUSCULAR | Status: AC
  Filled 2022-07-15: qty 2

## 2022-07-15 MED ORDER — ORAL CARE MOUTH RINSE: 15.0000 mL | Freq: Once | OROMUCOSAL | Status: AC

## 2022-07-15 MED ORDER — METRONIDAZOLE 500 MG/100ML IV SOLN: 500.0000 mg | INTRAVENOUS | Status: DC

## 2022-07-15 MED ORDER — PHENYLEPHRINE HCL-NACL 20-0.9 MG/250ML-% IV SOLN: 25.0000 ug/min | INTRAVENOUS | Status: DC

## 2022-07-15 MED ORDER — LIDOCAINE HCL (PF) 2 % IJ SOLN
INTRAMUSCULAR | Status: AC
  Filled 2022-07-15: qty 5

## 2022-07-15 MED ORDER — SODIUM CHLORIDE 0.9 % IV SOLN
2.0000 g | Freq: Every day | INTRAVENOUS | Status: DC
  Administered 2022-07-15 – 2022-07-21 (×7): 2 g via INTRAVENOUS
  Filled 2022-07-15 (×7): qty 20

## 2022-07-15 MED ORDER — ARFORMOTEROL TARTRATE 15 MCG/2ML IN NEBU
15.0000 ug | INHALATION_SOLUTION | Freq: Two times a day (BID) | RESPIRATORY_TRACT | Status: DC
  Administered 2022-07-15 – 2022-07-21 (×12): 15 ug via RESPIRATORY_TRACT
  Filled 2022-07-15 (×12): qty 2

## 2022-07-15 MED ORDER — PROPOFOL 10 MG/ML IV BOLUS
INTRAVENOUS | Status: AC
  Filled 2022-07-15: qty 20

## 2022-07-15 MED ORDER — PROPOFOL 10 MG/ML IV BOLUS
INTRAVENOUS | Status: DC | PRN
  Administered 2022-07-15: 140 mg via INTRAVENOUS

## 2022-07-15 MED ORDER — FENTANYL CITRATE (PF) 250 MCG/5ML IJ SOLN
INTRAMUSCULAR | Status: DC | PRN
  Administered 2022-07-15: 100 ug via INTRAVENOUS

## 2022-07-15 MED ORDER — ALBUMIN HUMAN 5 % IV SOLN: INTRAVENOUS | Status: DC | PRN

## 2022-07-15 MED ORDER — FAMOTIDINE 20 MG PO TABS: 20.0000 mg | ORAL_TABLET | Freq: Two times a day (BID) | ORAL | Status: DC

## 2022-07-15 MED ORDER — ORAL CARE MOUTH RINSE
15.0000 mL | OROMUCOSAL | Status: DC
  Administered 2022-07-15 – 2022-07-21 (×17): 15 mL via OROMUCOSAL

## 2022-07-15 MED ORDER — FENTANYL 2500MCG IN NS 250ML (10MCG/ML) PREMIX INFUSION
25.0000 ug/h | INTRAVENOUS | Status: DC
  Administered 2022-07-15: 25 ug/h via INTRAVENOUS
  Filled 2022-07-15: qty 250

## 2022-07-15 MED ORDER — SODIUM CHLORIDE 0.9 % IV SOLN
250.0000 mL | INTRAVENOUS | Status: DC
  Administered 2022-07-15: 250 mL via INTRAVENOUS

## 2022-07-15 MED ORDER — PANTOPRAZOLE SODIUM 40 MG IV SOLR
40.0000 mg | Freq: Every day | INTRAVENOUS | Status: DC
  Administered 2022-07-15 – 2022-07-21 (×7): 40 mg via INTRAVENOUS
  Filled 2022-07-15 (×7): qty 10

## 2022-07-15 MED ORDER — FENTANYL CITRATE PF 50 MCG/ML IJ SOSY
25.0000 ug | PREFILLED_SYRINGE | Freq: Once | INTRAMUSCULAR | Status: AC
  Administered 2022-07-15: 25 ug via INTRAVENOUS

## 2022-07-15 MED ORDER — BUDESONIDE 0.5 MG/2ML IN SUSP
0.5000 mg | Freq: Two times a day (BID) | RESPIRATORY_TRACT | Status: DC
  Administered 2022-07-15 – 2022-07-21 (×12): 0.5 mg via RESPIRATORY_TRACT
  Filled 2022-07-15 (×12): qty 2

## 2022-07-15 MED ORDER — PROPOFOL 1000 MG/100ML IV EMUL
5.0000 ug/kg/min | INTRAVENOUS | Status: DC
  Administered 2022-07-15: 30 ug/kg/min via INTRAVENOUS
  Filled 2022-07-15: qty 100

## 2022-07-15 MED ORDER — SUCCINYLCHOLINE CHLORIDE 200 MG/10ML IV SOSY
PREFILLED_SYRINGE | INTRAVENOUS | Status: AC
  Filled 2022-07-15: qty 10

## 2022-07-15 MED ORDER — DEXAMETHASONE SODIUM PHOSPHATE 10 MG/ML IJ SOLN
INTRAMUSCULAR | Status: DC | PRN
  Administered 2022-07-15: 5 mg via INTRAVENOUS

## 2022-07-15 MED ORDER — ROCURONIUM BROMIDE 10 MG/ML (PF) SYRINGE
PREFILLED_SYRINGE | INTRAVENOUS | Status: AC
  Filled 2022-07-15: qty 10

## 2022-07-15 MED ORDER — PHENYLEPHRINE 80 MCG/ML (10ML) SYRINGE FOR IV PUSH (FOR BLOOD PRESSURE SUPPORT)
PREFILLED_SYRINGE | INTRAVENOUS | Status: DC | PRN
  Administered 2022-07-15: 80 ug via INTRAVENOUS
  Administered 2022-07-15: 240 ug via INTRAVENOUS
  Administered 2022-07-15: 320 ug via INTRAVENOUS

## 2022-07-15 MED ORDER — MIDAZOLAM HCL 2 MG/2ML IJ SOLN
INTRAMUSCULAR | Status: DC | PRN
  Administered 2022-07-15: .5 mg via INTRAVENOUS
  Administered 2022-07-15: 1.5 mg via INTRAVENOUS

## 2022-07-15 SURGICAL SUPPLY — 40 items
APPLICATOR COTTON TIP 6 STRL (MISCELLANEOUS) ×1 IMPLANT
APPLICATOR COTTON TIP 6IN STRL (MISCELLANEOUS) ×1 IMPLANT
BAG COUNTER SPONGE SURGICOUNT (BAG) IMPLANT
BLADE EXTENDED COATED 6.5IN (ELECTRODE) IMPLANT
BLADE HEX COATED 2.75 (ELECTRODE) ×1 IMPLANT
CANISTER WOUNDNEG PRESSURE 500 (CANNISTER) IMPLANT
COVER MAYO STAND STRL (DRAPES) IMPLANT
COVER SURGICAL LIGHT HANDLE (MISCELLANEOUS) ×1 IMPLANT
DRAPE LAPAROSCOPIC ABDOMINAL (DRAPES) ×1 IMPLANT
DRAPE UTILITY XL STRL (DRAPES) ×1 IMPLANT
DRAPE WARM FLUID 44X44 (DRAPES) IMPLANT
DRSG VAC ATS MED SENSATRAC (GAUZE/BANDAGES/DRESSINGS) IMPLANT
DRSG VAC GRANUFOAM MED (GAUZE/BANDAGES/DRESSINGS) IMPLANT
ELECT REM PT RETURN 15FT ADLT (MISCELLANEOUS) ×1 IMPLANT
GAUZE SPONGE 4X4 12PLY STRL (GAUZE/BANDAGES/DRESSINGS) ×1 IMPLANT
GLOVE BIO SURGEON STRL SZ7 (GLOVE) ×1 IMPLANT
GLOVE BIOGEL PI IND STRL 7.0 (GLOVE) ×1 IMPLANT
GLOVE BIOGEL PI IND STRL 7.5 (GLOVE) ×1 IMPLANT
GOWN STRL REUS W/ TWL LRG LVL3 (GOWN DISPOSABLE) ×2 IMPLANT
GOWN STRL REUS W/TWL LRG LVL3 (GOWN DISPOSABLE) ×2
HANDLE SUCTION POOLE (INSTRUMENTS) IMPLANT
KIT BASIN OR (CUSTOM PROCEDURE TRAY) ×1 IMPLANT
KIT TURNOVER KIT A (KITS) IMPLANT
LIGASURE IMPACT 36 18CM CVD LR (INSTRUMENTS) IMPLANT
NS IRRIG 1000ML POUR BTL (IV SOLUTION) IMPLANT
PACK GENERAL/GYN (CUSTOM PROCEDURE TRAY) ×1 IMPLANT
RELOAD PROXIMATE 75MM BLUE (ENDOMECHANICALS) ×2 IMPLANT
RELOAD STAPLE 75 3.8 BLU REG (ENDOMECHANICALS) IMPLANT
STAPLER GUN LINEAR PROX 60 (STAPLE) IMPLANT
STAPLER PROXIMATE 75MM BLUE (STAPLE) IMPLANT
STAPLER VISISTAT 35W (STAPLE) ×1 IMPLANT
SUCTION POOLE HANDLE (INSTRUMENTS)
SUT SILK 2 0 SH CR/8 (SUTURE) IMPLANT
SUT SILK 3 0 (SUTURE)
SUT SILK 3 0 SH CR/8 (SUTURE) IMPLANT
SUT SILK 3-0 18XBRD TIE 12 (SUTURE) IMPLANT
TOWEL OR 17X26 10 PK STRL BLUE (TOWEL DISPOSABLE) ×2 IMPLANT
TRAY FOLEY MTR SLVR 16FR STAT (SET/KITS/TRAYS/PACK) ×1 IMPLANT
WATER STERILE IRR 1000ML POUR (IV SOLUTION) IMPLANT
YANKAUER SUCT BULB TIP NO VENT (SUCTIONS) IMPLANT

## 2022-07-15 NOTE — Op Note (Signed)
Pre-op diagnosis: Small bowel obstruction with localized peritonitis Postop diagnosis: Small bowel obstruction with closed-loop ischemia of section of ileum Procedure performed: Exploratory laparotomy, lysis of adhesions, small bowel resection with primary anastomosis Surgeon:Easton Fetty K Jaela Yepez Assistant: Jilda Roche, CST Anesthesia: General Indications: This is a 66 year old female with multiple medical issues who presented with several days of abdominal pain.  She had altered mental status and was unclear whether she had any nausea or vomiting.  CT scan shows signs of bowel obstruction.  Her previous abdominal surgery was a hysterectomy.  Her clinical status improved over the first 24 hours.  She had a normal white count.  She was not tachycardic and her blood work looked normal.  However today, her clinical status worsened.  She began having increasing abdominal pain and was intermittently hypotensive.  We recommended immediate exploratory laparotomy.  Operative findings: The patient had a very tight adhesion in the left lower pelvis from an adhesion to her remaining fallopian tube.  This had caused a closed-loop obstruction with ischemia of a 30 cm segment of the mid ileum.  There is no sign of perforation.  Description of procedure: The patient is brought to the operating room placed in the supine position on the operating room table.  After adequate level general anesthesia was obtained, Foley catheter was placed under sterile technique.  The patient's abdomen was prepped with ChloraPrep and draped sterile fashion.  A timeout was taken to ensure the proper patient and proper procedure.  We open her previous lower midline incision.  We extended this superiorly above the umbilicus.  I dissected down to the fascia with cautery.  We opened the linea alba.  I entered the peritoneal cavity carefully.  There is some omental adhesions to the anterior abdominal wall.  These were taken down.  The small bowel in  the upper abdomen appears grossly normal but distended.  However there is obvious ischemic bowel low in the pelvis.  We continued our lysis of adhesions until we could open the fascia widely in place a Balfour retractor for exposure.  I was then able to carefully mobilize the small bowel out of the pelvis.  There are some chronic adhesions of the right to the pelvis but these were not the source of the obstruction.  The left fallopian tube seems to have formed a very tight adhesion causing complete obstruction of a closed loop section of small bowel.  We were able to lyse this adhesion which freed up the loop of bowel.  There was an obvious transition point.  The involved segment of small bowel is approximately 30 cm.  We divided proximal and distal to this ischemic segment with GIA 75 stapler.  The LigaSure device was used to take the mesentery in between our 2 staple lines.  The specimen was sent for pathologic examination.  We completed our lysis of adhesions of the small bowel.  I examined the remaining small bowel from the cecum all the way back to the ligament of Treitz.  No other adhesions or obstruction were noted.  The cecum, appendix, ascending, transverse, descending, and sigmoid colon otherwise appear normal.  We irrigated thoroughly inspected for hemostasis.  I created a side-to-side stapled anastomosis with a GIA 75 and a TX 60 stapler.  The mesenteric defect was closed with 2-0 silk sutures.  3-0 silk was used to reinforce the crotch of the anastomosis.  The staple line was oversewn with 3-0 silk sutures.  The anastomosis was widely patent.  We again  irrigated inspected for hemostasis.  Our sponge and instrument count was correct.  We closed the fascia with double-stranded #1 PDS sutures.  We placed a wound VAC in the wound by cutting a sponge to fit and applying an occlusive drape.  This was placed to 125 mmHg suction.  The patient is then transferred back to the intensive care unit while still  intubated.  All sponge, instrument, and needle counts are correct.  Imogene Burn. Georgette Dover, MD, [facility]  General Surgery   07/15/2022 1:24 PM

## 2022-07-15 NOTE — Progress Notes (Signed)
PROGRESS NOTE    [name]  SHF:[id number] DOB: 05-Nov-1955 DOA: 07/13/2022 PCP: Idamae Schuller, MD     Brief Narrative:  H/o abdominal hysterectomy presents with ab pain, n/v, found to have sbo,has hypothermia, ng placed, admitted to progressive bed on warming blanket    Subjective:  She is seen after returned from OR, she is on the vent due to intraoperative hypotension  Assessment & Plan:  Principal Problem:   SBO (small bowel obstruction) ([place]) Active Problems:   GERD   Sinus bradycardia   HLD (hyperlipidemia)   Hypothermia   Prolonged QT interval   Sbo S/p Exploratory laparotomy, lysis of adhesions, small bowel resection with primary anastomosis , post op intubated , care transition to critical care team Appreciate gen surg  and critical care input  Hypothermia Blood culture no growth Cortisol level elevated   Sinus bradycardia Tsh 0.6 Resolved, echocardiogram no acute findings,  monitor   Qtc 490 Keep K>4, mag >2 Repeat EKG in am  Impaired fasting blood glucose Denies h/o DM2, possible due to stress   UDS+ cocaine, THC  .   I have Reviewed nursing notes, Vitals, pain scores, I/o's, Lab results and  imaging results since pt's last encounter, details please see discussion above  I ordered the following labs:  Unresulted Labs (From admission, onward)     Start     Ordered   07/16/22 0500  Comprehensive metabolic panel  Tomorrow morning,   R       Question:  Specimen collection method  Answer:  Lab=Lab collect   07/15/22 1454   07/16/22 0500  CBC  Tomorrow morning,   R       Question:  Specimen collection method  Answer:  Lab=Lab collect   07/15/22 1454   07/16/22 0500  Triglycerides  (propofol (DIPRIVAN) infusion)  Every 72 hours,   R (with TIMED occurrences)     Comments: while on propofol (DIPRIVAN)   Question:  Specimen collection method  Answer:  Lab=Lab collect   07/15/22 1554   07/15/22 0749  Culture, blood (Routine X 2) w Reflex to ID  Panel  BLOOD CULTURE X 2,   R (with TIMED occurrences)      07/15/22 0749   07/15/22 0749  Urinalysis, Routine w reflex microscopic  Once,   R        07/15/22 0749   07/15/22 0500  Hemoglobin A1c  Tomorrow morning,   R       Question:  Specimen collection method  Answer:  Lab=Lab collect   07/14/22 0849             DVT prophylaxis: SCDs Start: 07/14/22 0009   Code Status:   Code Status: Full Code  Family Communication: none at bedside Disposition:    Dispo: The patient is from: home              Anticipated d/c is to: home              Anticipated d/c date is: >72hrs  Antimicrobials:   Anti-infectives (From admission, onward)    Start     Dose/Rate Route Frequency Ordered Stop   07/15/22 1700  metroNIDAZOLE (FLAGYL) IVPB 500 mg        500 mg 100 mL/hr over 60 Minutes Intravenous Every 12 hours 07/15/22 1556     07/15/22 1600  cefTRIAXone (ROCEPHIN) 2 g in sodium chloride 0.9 % 100 mL IVPB        2 g 200  mL/hr over 30 Minutes Intravenous Daily 07/15/22 1454     07/15/22 1100  ceFAZolin (ANCEF) IVPB 2g/100 mL premix       See Hyperspace for full Linked Orders Report.   2 g 200 mL/hr over 30 Minutes Intravenous On call to O.R. 07/15/22 1000 07/15/22 1200   07/15/22 1100  metroNIDAZOLE (FLAGYL) IVPB 500 mg  Status:  Discontinued       See Hyperspace for full Linked Orders Report.   500 mg 100 mL/hr over 60 Minutes Intravenous On call to O.R. 07/15/22 1000 07/15/22 1119           Objective: Vitals:   07/15/22 1655 07/15/22 1700 07/15/22 1710 07/15/22 1742  BP:  136/62    Pulse: 71 70 70 88  Resp: '15 18 18 '$ (!) 22  Temp:      TempSrc:      SpO2: 97% 98% 98% 99%  Weight:      Height:        Intake/Output Summary (Last 24 hours) at 07/15/2022 1754 Last data filed at 07/15/2022 1600 Gross per 24 hour  Intake 2919.62 ml  Output 600 ml  Net 2319.62 ml   Filed Weights   07/15/22 1112  Weight: 54.4 kg    Examination:  General exam: on the  vent Respiratory system: Clear to auscultation.  Cardiovascular system:  RRR.  Gastrointestinal system: Abdomen post op changes. Central nervous system: lethargic Extremities:  no edema Skin: No rashes, lesions or ulcers Psychiatry: not able to assess     Data Reviewed: I have personally reviewed  labs and visualized  imaging studies since the last encounter and formulate the plan        Scheduled Meds:  arformoterol  15 mcg Nebulization BID   budesonide (PULMICORT) nebulizer solution  0.5 mg Nebulization BID   Chlorhexidine Gluconate Cloth  6 each Topical Daily   mouth rinse  15 mL Mouth Rinse Q2H   pantoprazole (PROTONIX) IV  40 mg Intravenous Daily   Continuous Infusions:  sodium chloride Stopped (07/15/22 1532)   cefTRIAXone (ROCEPHIN)  IV Stopped (07/15/22 1603)   fentaNYL infusion INTRAVENOUS 50 mcg/hr (07/15/22 1600)   lactated ringers 75 mL/hr at 07/15/22 1600   metronidazole Stopped (07/15/22 1732)   phenylephrine (NEO-SYNEPHRINE) Adult infusion Stopped (07/15/22 1443)   propofol (DIPRIVAN) infusion Stopped (07/15/22 1716)     LOS: 2 days     Florencia Reasons, MD PhD FACP [facility]  Available via Epic secure chat 7am-7pm for nonurgent issues Please page for urgent issues To page the attending provider between 7A-7P or the covering provider during after hours 7P-7A, please log into the web site [email or web address] and access using universal [place] password for that web site. If you do not have the password, please call the hospital operator.    07/15/2022, 5:54 PM

## 2022-07-15 NOTE — Progress Notes (Addendum)
Progress Note     Subjective: A&Ox4. Having more abdominal pain this morning. No flatus or BM. She notes she is burping more.   Objective: Vital signs in last 24 hours: Temp:  [97.3 F (36.3 C)-98.2 F (36.8 C)] 97.8 F (36.6 C) (12/22 0400) Pulse Rate:  [73-95] 74 (12/22 0600) Resp:  [17-35] 23 (12/22 0600) BP: (96-153)/(61-129) 141/63 (12/22 0600) SpO2:  [90 %-96 %] 93 % (12/22 0600) Last BM Date : 07/12/22  Intake/Output from previous day: 12/21 0701 - 12/22 0700 In: 1216.6 [I.V.:1216.6] Out: 400 [Emesis/NG output:400] Intake/Output this shift: No intake/output data recorded.  PE: General: pleasant, WD, female who is laying in bed in NAD but uncomfortable appearing Heart: regular, rate, and rhythm.   Lungs: Respiratory effort nonlabored Abd: soft, mild distension, moderate to severe diffuse TTP with guarding, greatest in LLQ. NGT with dark bilious fluid in cannister. 553m/24 hr documented MSK: all 4 extremities are symmetrical with no cyanosis, clubbing, or edema. Skin: warm and dry Psych: A&Ox3 with an appropriate affect.    Lab Results:  Recent Labs    07/14/22 0043 07/15/22 0258  WBC 6.9 14.6*  HGB 14.6 12.9  HCT 44.6 40.9  PLT 221 209    BMET Recent Labs    07/14/22 0043 07/15/22 0258  NA 139 139  K 3.8 4.1  CL 109 109  CO2 20* 20*  GLUCOSE 179* 163*  BUN 14 27*  CREATININE 0.95 1.23*  CALCIUM 9.1 8.5*    PT/INR Recent Labs    07/13/22 2153  LABPROT 13.3  INR 1.0    CMP     Component Value Date/Time   NA 139 07/15/2022 0258   NA 139 11/02/2021 1535   K 4.1 07/15/2022 0258   CL 109 07/15/2022 0258   CO2 20 (L) 07/15/2022 0258   GLUCOSE 163 (H) 07/15/2022 0258   BUN 27 (H) 07/15/2022 0258   BUN 9 11/02/2021 1535   CREATININE 1.23 (H) 07/15/2022 0258   CREATININE 0.84 05/03/2011 1642   CALCIUM 8.5 (L) 07/15/2022 0258   PROT 6.7 07/14/2022 0043   PROT 6.9 02/26/2018 1359   ALBUMIN 3.3 (L) 07/14/2022 0043   ALBUMIN 4.4  02/26/2018 1359   AST 23 07/14/2022 0043   ALT 22 07/14/2022 0043   ALKPHOS 58 07/14/2022 0043   BILITOT 0.7 07/14/2022 0043   BILITOT 0.4 02/26/2018 1359   GFRNONAA 48 (L) 07/15/2022 0258   GFRNONAA >60 05/03/2011 1642   GFRAA >60 05/31/2019 1632   GFRAA >60 05/03/2011 1642   Lipase     Component Value Date/Time   LIPASE 29 07/13/2022 1837       Studies/Results: DG Abd Portable 1V  Result Date: 07/15/2022 CLINICAL DATA:  Follow-up small bowel obstruction and nasogastric tube placement. EXAM: PORTABLE ABDOMEN - 1 VIEW COMPARISON:  07/14/2022 FINDINGS: Orogastric tube tip is located in the gastric fundus. Increased dilatation of multiple small bowel loops seen in the abdomen and upper pelvis, with a lack of colonic gas, consistent with small-bowel obstruction. Right upper quadrant surgical clips are seen from prior cholecystectomy. IMPRESSION: Nasogastric tube tip in gastric fundus. Increased small bowel dilatation, consistent with small-bowel obstruction. Electronically Signed   By: JMarlaine HindM.D.   On: 07/15/2022 05:07   DG Abd Portable 1V  Result Date: 07/14/2022 CLINICAL DATA:  [id number] for imaging study to confirm nasogastric (NG) tube placement [id number]: PORTABLE ABDOMEN - 1 VIEW COMPARISON:  Same day radiograph FINDINGS: Nausea gastric tube tip overlies  the stomach, side port near the GE junction. Prominent loop of bowel in the right lower quadrant. Paucity of small bowel gas. Right upper quadrant surgical clips. IMPRESSION: Nasogastric tube tip overlies the stomach, side port near the GE junction Consider Vance mint by 2.5 cm. Focal prominent loop of gas distended bowel in the right lower quadrant, similar to prior exam. Paucity of small bowel gas. Electronically Signed   By: Maurine Simmering M.D.   On: 07/14/2022 10:33   ECHOCARDIOGRAM COMPLETE  Result Date: 07/14/2022    ECHOCARDIOGRAM REPORT   Patient Name:   SHAMIYAH NGU Date of Exam: 07/14/2022 Medical Rec #:   [id number]     Height:       66.0 in Accession #:    [id number]    Weight:       126.0 lb Date of Birth:  1956-07-02     BSA:          1.643 m Patient Age:    66 years      BP:           137/76 mmHg Patient Gender: F             HR:           88 bpm. Exam Location:  Inpatient Procedure: 2D Echo, Color Doppler and Cardiac Doppler Indications:    R94.31 Abnormal EKG  History:        Patient has no prior history of Echocardiogram examinations.                 Risk Factors:Dyslipidemia.  Sonographer:    Raquel Sarna Senior RDCS Referring Phys: [place]  1. Left ventricular ejection fraction, by estimation, is 65 to 70%. The left ventricle has normal function. Mild LV mid-cavity gradient, peak 18 mmHg. The left ventricle has no regional wall motion abnormalities. There is mild concentric left ventricular hypertrophy. Left ventricular diastolic parameters are consistent with Grade I diastolic dysfunction (impaired relaxation).  2. Right ventricular systolic function is normal. The right ventricular size is normal. Tricuspid regurgitation signal is inadequate for assessing PA pressure.  3. The aortic valve is tricuspid. Aortic valve regurgitation is not visualized. No aortic stenosis is present.  4. The mitral valve is normal in structure. No evidence of mitral valve regurgitation. No evidence of mitral stenosis.  5. The inferior vena cava is normal in size with greater than 50% respiratory variability, suggesting right atrial pressure of 3 mmHg. FINDINGS  Left Ventricle: Left ventricular ejection fraction, by estimation, is 65 to 70%. The left ventricle has normal function. The left ventricle has no regional wall motion abnormalities. The left ventricular internal cavity size was normal in size. There is  mild concentric left ventricular hypertrophy. Left ventricular diastolic parameters are consistent with Grade I diastolic dysfunction (impaired relaxation). Right Ventricle: The right ventricular size  is normal. No increase in right ventricular wall thickness. Right ventricular systolic function is normal. Tricuspid regurgitation signal is inadequate for assessing PA pressure. Left Atrium: Left atrial size was normal in size. Right Atrium: Right atrial size was normal in size. Pericardium: There is no evidence of pericardial effusion. Mitral Valve: The mitral valve is normal in structure. No evidence of mitral valve regurgitation. No evidence of mitral valve stenosis. Tricuspid Valve: The tricuspid valve is normal in structure. Tricuspid valve regurgitation is trivial. Aortic Valve: The aortic valve is tricuspid. Aortic valve regurgitation is not visualized. No aortic stenosis is present. Pulmonic Valve: The pulmonic valve was  normal in structure. Pulmonic valve regurgitation is not visualized. Aorta: The aortic root is normal in size and structure. Venous: The inferior vena cava is normal in size with greater than 50% respiratory variability, suggesting right atrial pressure of 3 mmHg. IAS/Shunts: No atrial level shunt detected by color flow Doppler.  LEFT VENTRICLE PLAX 2D LVIDd:         3.90 cm   Diastology LVIDs:         2.70 cm   LV e' medial:    6.20 cm/s LV PW:         0.90 cm   LV E/e' medial:  6.8 LV IVS:        0.80 cm   LV e' lateral:   9.36 cm/s LVOT diam:     1.90 cm   LV E/e' lateral: 4.5 LV SV:         47 LV SV Index:   28 LVOT Area:     2.84 cm  RIGHT VENTRICLE RV S prime:     13.65 cm/s TAPSE (M-mode): 2.0 cm LEFT ATRIUM             Index        RIGHT ATRIUM          Index LA diam:        2.40 cm 1.46 cm/m   RA Area:     8.19 cm LA Vol (A2C):   26.7 ml 16.25 ml/m  RA Volume:   13.40 ml 8.15 ml/m LA Vol (A4C):   28.3 ml 17.22 ml/m LA Biplane Vol: 29.1 ml 17.71 ml/m  AORTIC VALVE LVOT Vmax:   104.00 cm/s LVOT Vmean:  67.000 cm/s LVOT VTI:    0.165 m  AORTA Ao Root diam: 2.80 cm Ao Asc diam:  2.70 cm MITRAL VALVE MV Area (PHT): 3.27 cm    SHUNTS MV Decel Time: 232 msec    Systemic VTI:  0.16  m MV E velocity: 42.40 cm/s  Systemic Diam: 1.90 cm MV A velocity: 73.70 cm/s MV E/A ratio:  0.58 Dalton McleanMD Electronically signed by Franki Monte Signature Date/Time: 07/14/2022/10:31:44 AM    Final    DG Abd Portable 1V-Small Bowel Obstruction Protocol-initial, 8 hr delay  Result Date: 07/14/2022 CLINICAL DATA:  Small bowel follow up EXAM: PORTABLE ABDOMEN - 1 VIEW COMPARISON:  Yesterday FINDINGS: Hazy/dilute contrast seen could be present within small bowel loops, no contrast seen in colon. Excreting contrast in the kidneys and bladder. No concerning mass effect or calcification. Ingested pills in the right lower quadrant. Bone island appearance at the left ilium. IMPRESSION: No contrast seen at the colon. Electronically Signed   By: Jorje Guild M.D.   On: 07/14/2022 07:39   DG Abd Portable 1V-Small Bowel Protocol-Position Verification  Result Date: 07/13/2022 CLINICAL DATA:  NG tube EXAM: PORTABLE ABDOMEN - 1 VIEW COMPARISON:  Abdominal x-ray 07/13/2022 FINDINGS: Nasogastric tube tip is now in the proximal body of the stomach. No dilated bowel loops are seen. IMPRESSION: Nasogastric tube tip in the proximal body of the stomach. Electronically Signed   By: Ronney Asters M.D.   On: 07/13/2022 22:36   DG Abd Portable 1V-Small Bowel Protocol-Position Verification  Result Date: 07/13/2022 CLINICAL DATA:  NG tube EXAM: PORTABLE ABDOMEN - 1 VIEW COMPARISON:  None Available. FINDINGS: Nasogastric tube tip is in the gastric fundus with sidehole at the level of the gastroesophageal junction. There is some gaseous distention of bowel in the central abdomen. There is residual  contrast in the renal collecting systems. Cholecystectomy clips are present. Lung bases are clear. IMPRESSION: Nasogastric tube tip is in the gastric fundus with sidehole at the level of the gastroesophageal junction. Recommend advancing tube. Electronically Signed   By: Ronney Asters M.D.   On: 07/13/2022 22:09   CT  ABDOMEN PELVIS W CONTRAST  Result Date: 07/13/2022 CLINICAL DATA:  Abdominal pain EXAM: CT ABDOMEN AND PELVIS WITH CONTRAST TECHNIQUE: Multidetector CT imaging of the abdomen and pelvis was performed using the standard protocol following bolus administration of intravenous contrast. RADIATION DOSE REDUCTION: This exam was performed according to the departmental dose-optimization program which includes automated exposure control, adjustment of the mA and/or kV according to patient size and/or use of iterative reconstruction technique. CONTRAST:  173m OMNIPAQUE IOHEXOL 300 MG/ML  SOLN COMPARISON:  None Available. FINDINGS: Lower chest: No acute abnormality. Hepatobiliary: No focal liver abnormality is seen. Status post cholecystectomy. No biliary dilatation. Pancreas: Unremarkable. No pancreatic ductal dilatation or surrounding inflammatory changes. Spleen: Normal in size without focal abnormality. Adrenals/Urinary Tract: Bilateral adrenal glands are unremarkable. No hydronephrosis or nephrolithiasis. Bladder is unremarkable. Stomach/Bowel: Multiple dilated and thick-walled loops of small bowel are clustered with transition point in the left pelvis, on series 2, image 59 with decompressed distal small bowel exiting neck of the internal hernia which is best appreciated on coronal series 5, image 71, and upstream dilation of small bowel loops. Engorgement of the mesenteric vessels. Normal appendix. Vascular/Lymphatic: Aortic atherosclerosis. No enlarged abdominal or pelvic lymph nodes. Reproductive: No adnexal masses. Other:  Trace free fluid of the right hemiabdomen. Musculoskeletal: No acute or significant osseous findings. IMPRESSION: 1. Multiple dilated and thick-walled loops of small bowel are clustered within the pelvis with internal hernia neck in the left pelvis, findings are consistent with small bowel obstruction secondary to internal hernia. Engorgement of the mesenteric vessels and small bowel wall  thickening, raises concern for ischemia. No evidence of pneumatosis or free air. 2. Trace free fluid of the right hemiabdomen. 3. Aortic Atherosclerosis (ICD10-I70.0). Critical Value/emergent results were called by telephone at the time of interpretation on 07/13/2022 at 8:28 pm [facility] who verbally acknowledged these results. Electronically Signed   By: LYetta GlassmanM.D.   On: 07/13/2022 20:32   DG Chest Portable 1 View  Result Date: 07/13/2022 CLINICAL DATA:  Nausea vomiting EXAM: PORTABLE CHEST 1 VIEW COMPARISON:  07/23/2012 FINDINGS: The heart size and mediastinal contours are within normal limits. Aortic atherosclerosis. Both lungs are clear. The visualized skeletal structures are unremarkable. IMPRESSION: No active disease. Electronically Signed   By: KDonavan FoilM.D.   On: 07/13/2022 19:15    Anti-infectives: Anti-infectives (From admission, onward)    None        Assessment/Plan SBO ?internal hernia - CT on admission with question of internal hernia - WBC and lactic acid w/ repeat normal on admit - history of abdominal hysterectomy and likely open cholecystectomy - NGT LIWS with out contrast on xray this am and with worsening bowel dilatation/obstruction - continue npo/NGT LIWS - abdominal exam has worsened this am and WBC elevated to 14.6.  - I am concerned about her worsening imaging findings and abdominal exam. Will discuss with MD continuing to monitor today vs possible dx laparoscopy  ADDENDUM: discussed plan with Dr. TGeorgette Dover Will follow lactate. Based on results and exam plan for exploratory laparotomy today. Have reached out to medicine team to update them  FEN: NPO/NGT, IVF ID: none VTE: okay for chemical ppx  from surgical perspective  Per primary AMS/hypothermia/bradycardia on admit  GERD HLD UDS positive for cocaine   I reviewed hospitalist notes, last 24 h vitals and pain scores, last 48 h intake and output, last 24 h labs and  trends, and last 24 h imaging results.    LOS: 2 days   [facility] 07/15/2022, 7:14 AM Please see Amion for pager number during day hours 7:00am-4:30pm

## 2022-07-15 NOTE — Progress Notes (Signed)
Lab called and notified of ABG being sent at this time.

## 2022-07-15 NOTE — Progress Notes (Signed)
Initial Nutrition Assessment  INTERVENTION:   -If pt unable to be extubated and have diet advanced, recommend TPN initiation.  -Will monitor plan of care  NUTRITION DIAGNOSIS:   Inadequate oral intake related to acute illness (SBO) as evidenced by NPO status.  GOAL:   Patient will meet greater than or equal to 90% of their needs  MONITOR:   Diet advancement, Weight trends, Labs, I & O's, Vent status  REASON FOR ASSESSMENT:   Malnutrition Screening Tool, Ventilator    ASSESSMENT:   66 y.o. female with medical history significant of HLD. Presented with abdominal pain nausea vomiting. Admitted for SBO.  Patient not in room, in OR having emergent exploratory laparotomy, LOA and SB resection. Pt remains intubated at this time. Depending on course, recommend nutrition support, TPN. Pt was having abdominal pain and N/V PTA. NGT was placed, output was 500 ml x 24 hrs.  Patient is currently intubated on ventilator support MV: 7.1 L/min Temp (24hrs), Avg:97.7 F (36.5 C), Min:97.6 F (36.4 C), Max:97.8 F (36.6 C)  Per weight records, pt has lost 15 lbs since 4/11 (11% wt loss x 8.5 months, insignificant for time frame).  Medications reviewed.  Labs reviewed. UDS+ cocaine, THC  NUTRITION - FOCUSED PHYSICAL EXAM:  In OR  Diet Order:   Diet Order             Diet NPO time specified  Diet effective now                   EDUCATION NEEDS:   Not appropriate for education at this time  Skin:  Skin Assessment: Skin Integrity Issues: Skin Integrity Issues:: Incisions Incisions: 12/22 abdomen  Last BM:  PTA  Height:   Ht Readings from Last 1 Encounters:  07/15/22 '5\' 6"'$  (1.676 m)    Weight:   Wt Readings from Last 1 Encounters:  07/15/22 54.4 kg    BMI:  Body mass index is 19.37 kg/m.  Estimated Nutritional Needs:   Kcal:  1400-1600  Protein:  80-95g  Fluid:  1.6L/day  Clayton Bibles, MS, RD, LDN Inpatient Clinical Dietitian Contact  information available via Amion

## 2022-07-15 NOTE — Anesthesia Procedure Notes (Signed)
Arterial Line Insertion Start/End12/22/2023 12:22 AM, 07/15/2022 12:28 AM Performed by: Josephine Igo, MD, Raenette Rover, CRNA, CRNA  Patient location: OR. Preanesthetic checklist: patient identified, IV checked, site marked, risks and benefits discussed, surgical consent, monitors and equipment checked, pre-op evaluation, timeout performed and anesthesia consent Left, radial was placed Catheter size: 20 G Hand hygiene performed  and maximum sterile barriers used  Allen's test indicative of satisfactory collateral circulation Attempts: 1 Procedure performed without using ultrasound guided technique. Following insertion, dressing applied and Biopatch. Post procedure assessment: normal

## 2022-07-15 NOTE — Anesthesia Postprocedure Evaluation (Signed)
Anesthesia Post Note  Patient: [name]  Procedure(s) Performed: EXPLORATORY LAPAROTOMY; LYSIS OF ADHESIONS; SMALL BOWEL RESECTION; WOUND VAC PLACEMENT (Abdomen)     Patient location during evaluation: PACU Anesthesia Type: General Level of consciousness: patient remains intubated per anesthesia plan and sedated Pain management: pain level controlled Vital Signs Assessment: post-procedure vital signs reviewed and stable Respiratory status: nonlabored ventilation, patient on ventilator - see flowsheet for VS and patient remains intubated per anesthesia plan Cardiovascular status: unstable Postop Assessment: no apparent nausea or vomiting Anesthetic complications: no Comments: Patient left intubated due to ischemic bowel. A-line was inserted intra-op for hemodynamic monitoring. Patient remains on Phenylephrine gtt. Patient taken from OR to ICU with full monitors, sedated on Propofol gtt.   No notable events documented.  Last Vitals:  Vitals:   07/15/22 1112 07/15/22 1335  BP: 131/80 (!) 151/76  Pulse: 80 74  Resp: 20 16  Temp: 36.6 C   SpO2:  100%    Last Pain:  Vitals:   07/15/22 1112  TempSrc: Oral  PainSc: 7                  Saranya Harlin A.

## 2022-07-15 NOTE — Transfer of Care (Signed)
Immediate Anesthesia Transfer of Care Note  Patient: [name]  Procedure(s) Performed: EXPLORATORY LAPAROTOMY; SMALL BOWEL RESECTION (Abdomen)  Patient Location: ICU  Anesthesia Type:General  Level of Consciousness: sedated and Patient remains intubated per anesthesia plan  Airway & Oxygen Therapy: Patient Spontanous Breathing, Patient remains intubated per anesthesia plan, and Patient placed on Ventilator (see vital sign flow sheet for setting)  Post-op Assessment: Report given to RN and Post -op Vital signs reviewed and stable  Post vital signs: Reviewed and stable  Last Vitals:  Vitals Value Taken Time  BP 151/76 07/15/22 1335  Temp    Pulse 74 07/15/22 1342  Resp 17 07/15/22 1342  SpO2 96 % 07/15/22 1342  Vitals shown include unvalidated device data.  Last Pain:  Vitals:   07/15/22 1112  TempSrc: Oral  PainSc: 7          Complications: No notable events documented.

## 2022-07-15 NOTE — Anesthesia Preprocedure Evaluation (Addendum)
Anesthesia Evaluation  Patient identified by MRN, date of birth, ID band Patient awake    Reviewed: Allergy & Precautions, NPO status , Patient's Chart, lab work & pertinent test results  History of Anesthesia Complications Negative for: history of anesthetic complications  Airway Mallampati: II  TM Distance: >3 FB Neck ROM: Full    Dental  (+) Edentulous Upper, Missing, Dental Advisory Given   Pulmonary Current Smoker and Patient abstained from smoking.   Pulmonary exam normal breath sounds clear to auscultation       Cardiovascular negative cardio ROS Normal cardiovascular exam Rhythm:Regular Rate:Normal  TTE 07/14/22: EF 65-70%, mild LVH, grade I DD, valves ok      Neuro/Psych  PSYCHIATRIC DISORDERS  Depression     Neuromuscular disease    GI/Hepatic ,GERD  Medicated,,(+)     substance abuse (UDS positive 07/13/22 for THC&cocaine)  cocaine use and marijuana useSBO   Endo/Other  Hyperlipidemia  Renal/GU ARF and Renal InsufficiencyRenal disease (Cr 1.23 (from 0.95 yesterday))  negative genitourinary   Musculoskeletal negative musculoskeletal ROS (+)    Abdominal   Peds  Hematology negative hematology ROS (+)   Anesthesia Other Findings Day of surgery medications reviewed with patient.  Reproductive/Obstetrics                             Anesthesia Physical Anesthesia Plan  ASA: 3 and emergent  Anesthesia Plan: General   Post-op Pain Management: Ofirmev IV (intra-op)*   Induction: Cricoid pressure planned, Rapid sequence and Intravenous  PONV Risk Score and Plan: 4 or greater and Treatment may vary due to age or medical condition, Midazolam, Dexamethasone and Ondansetron  Airway Management Planned: Oral ETT  Additional Equipment: None  Intra-op Plan:   Post-operative Plan: Extubation in OR  Informed Consent: I have reviewed the patients History and Physical, chart, labs  and discussed the procedure including the risks, benefits and alternatives for the proposed anesthesia with the patient or authorized representative who has indicated his/her understanding and acceptance.     Dental advisory given  Plan Discussed with: Anesthesiologist and CRNA  Anesthesia Plan Comments:        Anesthesia Quick Evaluation

## 2022-07-15 NOTE — Procedures (Signed)
Extubation Procedure Note  Patient Details:   Name: [name] DOB: 1956/05/13 MRN: [id number]   Airway Documentation:    Vent end date: 07/15/22 Vent end time: 1742   Evaluation  O2 sats: stable throughout Complications: No apparent complications Patient did tolerate procedure well. Bilateral Breath Sounds: Rhonchi   Yes  Patient tolerated wean. MD and RN at bedside. Positive for cuff leak. Patient extubated to a 6 Lpm nasal cannula. No signs of dyspnea or stridor noted. Will continue to monitor.  Myrtie Neither 07/15/2022, 5:53 PM

## 2022-07-15 NOTE — Consult Note (Addendum)
NAME:  [name], MRN:  [id number], DOB:  05-Sep-1955, LOS: 2 ADMISSION DATE:  07/13/2022, CONSULTATION DATE:  12/22  REFERRING MD:  Dr. Erlinda Hong, CHIEF COMPLAINT:  Hypotension    History of Present Illness:  66 y/o F who presented by EMS to [facility] ER with reports of nausea, vomiting, fever, chills and weakness.    The patient was hypotensive, hypothermic, bradycardic with HR in the 30's on presentation to ER.  She was given atropine for bradycardia.  She was altered initially with unclear history. KUB was negative. UDS positive for cocaine, & THC.  QTc noted to be 490 on admit.  CT ABD/Pelvis completed which showed dilated & thick-walled loops of small bowel clustered within the pelvis with internal hernia neck in the the left pelvis consistent with small bowel obstruction.  Engorgement of the mesenteric vessels and small bowel wall thickening concerning for ischemia. No free air or pneumatosis.  NGT was placed for decompression. She had increasing abdominal pain.  General Surgery was consulted and the patient was taken on 12/22 for exploratory laparotomy with lysis of adhesions, small bowel resection with primary anastomosis.  She had intra-operative hypotension and the patient remained intubated, transferred to ICU.   PCCM consulted for assistance with ICU care.    Pertinent  Medical History  Bradycardia  HLD GERD  Depression  Goiter Tobacco Abuse  THC Abuse  ETOH Abuse  Abdominal Hysterectomy  Cholecystectomy  Significant Hospital Events: Including procedures, antibiotic start and stop dates in addition to other pertinent events   12/20 Admit  12/22 PCCM consulted post operative: hypotension, vent needs  Interim History / Subjective:  Afebrile  On fentanyl infusion, propofol   Objective   Blood pressure (!) 151/76, pulse 62, temperature 97.8 F (36.6 C), temperature source Oral, resp. rate 18, height '5\' 6"'$  (1.676 m), weight 54.4 kg, last menstrual period 11/08/1991, SpO2 100 %.     Vent Mode: PRVC FiO2 (%):  [100 %] 100 % Set Rate:  [16 bmp-18 bmp] 18 bmp Vt Set:  [470 mL] 470 mL PEEP:  [5 cmH20] 5 cmH20 Plateau Pressure:  [22 cmH20] 22 cmH20   Intake/Output Summary (Last 24 hours) at 07/15/2022 1509 Last data filed at 07/15/2022 1438 Gross per 24 hour  Intake 2791.24 ml  Output 600 ml  Net 2191.24 ml   Filed Weights   07/15/22 1112  Weight: 54.4 kg    Examination: General: adult female lying in bed on vent in NAD HENT: MM pink/moist, #7 ETT, arcus senilis, pupils 29m, anicteric  Lungs: non-labored at rest on vent, lungs bilaterally with coarse rhonchi, wheezing  Cardiovascular: s1s2 RRR, distant tones due to breath sounds, aline in place with good waveform Abdomen: soft, midline VAC in place Extremities: warm/dry, no edema  Neuro: sedate    Resolved Hospital Problem list   Hypothermia  Bradycardia   Assessment & Plan:   Abdominal Pain secondary to SBO  S/p ex-lap on 12/22 with lysis of adhesions  -pain control with Fentanyl per PAD protocol while on mechanical ventilation  -NPO  -post operative care / VAC per CCS -defer timing of nutrition to CCS  -rocephin + flagyl  -follow up blood cultures from admission  Post Operative Mechanical Ventilation  Tobacco Abuse, THC Abuse  -PRVC with LTVV -wean PEEP / FiO2 for sats >90% -now CXR  -ABG after arrival on unit  -SBT with goal for extubation  -brovana + pulmicort   AKI  -LR at 746mhr Trend BMP / urinary output -  Replace electrolytes as indicated -Avoid nephrotoxic agents, ensure adequate renal perfusion  Prolonged QTc  -follow up EKG   Depression Chronic Pain  -hold home prozac -hold home gabapentin   THC / ETOH Abuse  -monitor for evidence of withdrawal   HLD  -hold home crestor  Best Practice (right click and "Reselect all SmartList Selections" daily)  Diet/type: NPO DVT prophylaxis: SCD GI prophylaxis: PPI Lines: Arterial Line Foley:  Yes, and it is still  needed Code Status:  full code Last date of multidisciplinary goals of care discussion:  pending  Labs   CBC: Recent Labs  Lab 07/13/22 1837 07/14/22 0043 07/15/22 0258  WBC 4.5 6.9 14.6*  NEUTROABS 3.0  --  12.2*  HGB 15.2* 14.6 12.9  HCT 45.4 44.6 40.9  MCV 91.2 93.5 97.4  PLT 235 221 818    Basic Metabolic Panel: Recent Labs  Lab 07/13/22 1837 07/13/22 2153 07/14/22 0043 07/15/22 0258  NA 139  --  139 139  K 3.6  --  3.8 4.1  CL 104  --  109 109  CO2 21*  --  20* 20*  GLUCOSE 186*  --  179* 163*  BUN 15  --  14 27*  CREATININE 0.98  --  0.95 1.23*  CALCIUM 10.2  --  9.1 8.5*  MG 2.1  --  1.9 1.8  PHOS  --  3.2 4.5  --    GFR: Estimated Creatinine Clearance: 38.1 mL/min (A) (by C-G formula based on SCr of 1.23 mg/dL (H)). Recent Labs  Lab 07/13/22 1837 07/13/22 1845 07/13/22 2136 07/14/22 0043 07/15/22 0258 07/15/22 0851 07/15/22 1033  WBC 4.5  --   --  6.9 14.6*  --   --   LATICACIDVEN  --  1.6 1.4  --   --  1.7 1.7    Liver Function Tests: Recent Labs  Lab 07/13/22 1837 07/14/22 0043  AST 26 23  ALT 25 22  ALKPHOS 68 58  BILITOT 1.0 0.7  PROT 8.4* 6.7  ALBUMIN 4.3 3.3*   Recent Labs  Lab 07/13/22 1837  LIPASE 29   No results for input(s): "AMMONIA" in the last 168 hours.  ABG    Component Value Date/Time   PHART 7.43 07/15/2022 1445   PCO2ART 33 07/15/2022 1445   PO2ART 133 (H) 07/15/2022 1445   HCO3 22.1 07/15/2022 1445   ACIDBASEDEF 1.7 07/15/2022 1445   O2SAT 99.5 07/15/2022 1445     Coagulation Profile: Recent Labs  Lab 07/13/22 2153  INR 1.0    Cardiac Enzymes: Recent Labs  Lab 07/13/22 2153  CKTOTAL 37*    HbA1C: No results found for: "HGBA1C"  CBG: Recent Labs  Lab 07/13/22 1845  GLUCAP 181*    Review of Systems:   Unable to complete with patient due to mechanical ventilation   Past Medical History:  She,  has a past medical history of Back pain (07/07/2009), Bilateral knee pain (05/29/2012),  Bradycardia (05/15/2006), Bursitis of right shoulder (05/10/2010), Depression (05/15/2006), Ecchymoses, spontaneous (08/12/2010), Fatigue (08/12/2010), GERD (gastroesophageal reflux disease) (05/15/2006), Inguinal pain (03/23/2010), Intertrigo (03/04/2014), Left hip pain (11/25/2019), Meralgia paresthetica of right side (05/08/2019), Mole (skin) (07/07/2009), Numbness and tingling of right arm (02/26/2018), Personal history of goiter (05/15/2006), Right hand pain (11/25/2020), Shoulder pain, right (07/07/2009), Smoker (05/10/2010), Tendinopathy of left rotator cuff (09/09/2016), and Weight gain (08/12/2010).   Surgical History:   Past Surgical History:  Procedure Laterality Date   ABDOMINAL HYSTERECTOMY     CHOLECYSTECTOMY  Social History:   reports that she has been smoking cigarettes. She has been smoking an average of .1 packs per day. She has never used smokeless tobacco. She reports current alcohol use. She reports current drug use. Frequency: 1.00 time per week. Drug: Marijuana.   Family History:  Her family history includes Cancer in her brother, daughter, mother, and sister; Diabetes in her father and mother; Kidney disease in her father; Mental illness in her sister; Stroke in her brother.   Allergies Allergies  Allergen Reactions   Aspirin Nausea And Vomiting    Other Reaction(s): Unknown     Home Medications  Prior to Admission medications   Medication Sig Start Date End Date Taking? Authorizing Provider  FLUoxetine (PROZAC) 40 MG capsule Take 80 mg by mouth daily.   Yes [provider]  gabapentin (NEURONTIN) 300 MG capsule Take 2 capsules (600 mg total) by mouth 3 (three) times daily. TAKE 2 CAPSULES(600 MG) BY MOUTH THREE TIMES DAILY Strength: 300 mg 04/12/22 04/12/23 Yes Idamae Schuller, MD  Multiple Vitamin (MULTIVITAMIN WITH MINERALS) TABS Take 1 tablet by mouth daily.   Yes [provider]  omeprazole (PRILOSEC) 40 MG capsule Take 40 mg by mouth daily.   Yes  [provider]  rosuvastatin (CRESTOR) 20 MG tablet Take 1 tablet (20 mg total) by mouth daily. 04/14/22 04/14/23 Yes Idamae Schuller, MD  acetaminophen (TYLENOL) 500 MG tablet Take 1 tablet (500 mg total) by mouth every 6 (six) hours as needed. Patient not taking: Reported on 07/13/2022 10/12/14   Juluis Mire, MD  cetirizine (ZYRTEC ALLERGY) 10 MG tablet Take 1 tablet (10 mg total) by mouth daily. Patient not taking: Reported on 07/13/2022 04/12/22 04/12/23  Idamae Schuller, MD  omeprazole (PRILOSEC) 40 MG capsule Take 1 capsule (40 mg total) by mouth daily. 04/12/22 07/11/22  Idamae Schuller, MD     Critical care time: 75 minutes    Noe Gens, MSN, APRN, NP-C, AGACNP-BC  Pulmonary & Critical Care 07/15/2022, 3:09 PM   Please see [email or web address] for pager details.   From 7A-7P if no response, please call [phone] After hours, please call ELink [phone]

## 2022-07-15 NOTE — Anesthesia Procedure Notes (Signed)
Procedure Name: Intubation Date/Time: 07/15/2022 11:52 AM  Performed by: Raenette Rover, CRNAPre-anesthesia Checklist: Patient identified, Emergency Drugs available, Suction available and Patient being monitored Patient Re-evaluated:Patient Re-evaluated prior to induction Oxygen Delivery Method: Circle system utilized Preoxygenation: Pre-oxygenation with 100% oxygen Induction Type: IV induction, Rapid sequence and Cricoid Pressure applied Laryngoscope Size: Mac and 3 Grade View: Grade I Tube type: Oral Tube size: 7.0 mm Number of attempts: 1 Airway Equipment and Method: Stylet Placement Confirmation: ETT inserted through vocal cords under direct vision, positive ETCO2 and breath sounds checked- equal and bilateral Secured at: 21 cm Tube secured with: Tape Dental Injury: Teeth and Oropharynx as per pre-operative assessment  Comments: NGT to suction prior to induction and intubation. Vocal cords clear.

## 2022-07-16 DIAGNOSIS — K56609 Unspecified intestinal obstruction, unspecified as to partial versus complete obstruction: Secondary | ICD-10-CM | POA: Diagnosis not present

## 2022-07-16 LAB — COMPREHENSIVE METABOLIC PANEL
ALT: 36 U/L (ref 0–44)
AST: 48 U/L — ABNORMAL HIGH (ref 15–41)
Albumin: 2.9 g/dL — ABNORMAL LOW (ref 3.5–5.0)
Alkaline Phosphatase: 38 U/L (ref 38–126)
Anion gap: 4 — ABNORMAL LOW (ref 5–15)
BUN: 21 mg/dL (ref 8–23)
CO2: 28 mmol/L (ref 22–32)
Calcium: 8.3 mg/dL — ABNORMAL LOW (ref 8.9–10.3)
Chloride: 110 mmol/L (ref 98–111)
Creatinine, Ser: 0.84 mg/dL (ref 0.44–1.00)
GFR, Estimated: >60 mL/min (ref >60)
Glucose, Bld: 111 mg/dL — ABNORMAL HIGH (ref 70–99)
Potassium: 4 mmol/L (ref 3.5–5.1)
Sodium: 142 mmol/L (ref 135–145)
Total Bilirubin: 0.5 mg/dL (ref 0.3–1.2)
Total Protein: 5.9 g/dL — ABNORMAL LOW (ref 6.5–8.1)

## 2022-07-16 LAB — CBC
HCT: 28.7 % — ABNORMAL LOW (ref 36.0–46.0)
Hemoglobin: 9.4 g/dL — ABNORMAL LOW (ref 12.0–15.0)
MCH: 30.7 pg (ref 26.0–34.0)
MCHC: 32.8 g/dL (ref 30.0–36.0)
MCV: 93.8 fL (ref 80.0–100.0)
Platelets: 128 10*3/uL — ABNORMAL LOW (ref 150–400)
RBC: 3.06 MIL/uL — ABNORMAL LOW (ref 3.87–5.11)
RDW: 13.3 % (ref 11.5–15.5)
WBC: 9.4 10*3/uL (ref 4.0–10.5)
nRBC: 0 % (ref 0.0–0.2)

## 2022-07-16 LAB — HEMOGLOBIN A1C
Hgb A1c MFr Bld: 6 % — ABNORMAL HIGH (ref 4.8–5.6)
Mean Plasma Glucose: 126 mg/dL

## 2022-07-16 LAB — MAGNESIUM: Magnesium: 1.8 mg/dL (ref 1.7–2.4)

## 2022-07-16 MED ORDER — MAGNESIUM SULFATE 2 GM/50ML IV SOLN
2.0000 g | Freq: Once | INTRAVENOUS | Status: AC
  Administered 2022-07-16: 2 g via INTRAVENOUS
  Filled 2022-07-16: qty 50

## 2022-07-16 MED ORDER — LIP MEDEX EX OINT
TOPICAL_OINTMENT | Freq: Two times a day (BID) | CUTANEOUS | Status: DC
  Administered 2022-07-16 – 2022-07-19 (×5): 75 via TOPICAL
  Filled 2022-07-16 (×4): qty 7

## 2022-07-16 MED ORDER — MENTHOL 3 MG MT LOZG
1.0000 | LOZENGE | OROMUCOSAL | Status: DC | PRN
  Filled 2022-07-16: qty 9

## 2022-07-16 MED ORDER — MAGIC MOUTHWASH: 15.0000 mL | Freq: Four times a day (QID) | ORAL | Status: DC | PRN

## 2022-07-16 MED ORDER — PROCHLORPERAZINE EDISYLATE 10 MG/2ML IJ SOLN: 5.0000 mg | INTRAMUSCULAR | Status: DC | PRN

## 2022-07-16 MED ORDER — MIDAZOLAM HCL 2 MG/2ML IJ SOLN
0.5000 mg | Freq: Once | INTRAMUSCULAR | Status: AC
  Administered 2022-07-16: 0.5 mg via INTRAVENOUS
  Filled 2022-07-16: qty 2

## 2022-07-16 MED ORDER — BISACODYL 10 MG RE SUPP
10.0000 mg | Freq: Every day | RECTAL | Status: DC
  Administered 2022-07-16 – 2022-07-19 (×4): 10 mg via RECTAL
  Filled 2022-07-16 (×6): qty 1

## 2022-07-16 MED ORDER — LACTATED RINGERS IV BOLUS: 1000.0000 mL | Freq: Three times a day (TID) | INTRAVENOUS | Status: AC | PRN

## 2022-07-16 MED ORDER — PHENOL 1.4 % MT LIQD: 2.0000 | OROMUCOSAL | Status: DC | PRN

## 2022-07-16 MED ORDER — BENZOCAINE-MENTHOL 6-10 MG MT LOZG
1.0000 | LOZENGE | OROMUCOSAL | Status: DC | PRN
  Administered 2022-07-16: 1 via ORAL

## 2022-07-16 MED ORDER — ALUM & MAG HYDROXIDE-SIMETH 200-200-20 MG/5ML PO SUSP: 30.0000 mL | Freq: Four times a day (QID) | ORAL | Status: DC | PRN

## 2022-07-16 MED ORDER — METHOCARBAMOL 1000 MG/10ML IJ SOLN
1000.0000 mg | Freq: Four times a day (QID) | INTRAVENOUS | Status: DC | PRN
  Administered 2022-07-19: 1000 mg via INTRAVENOUS
  Filled 2022-07-16: qty 1000

## 2022-07-16 MED ORDER — BENZOCAINE-MENTHOL 6-10 MG MT LOZG: 1.0000 | LOZENGE | OROMUCOSAL | Status: DC | PRN

## 2022-07-16 NOTE — Progress Notes (Signed)
[name] [id number] 20-Aug-1955  CARE TEAM:  PCP: Idamae Schuller, MD  Outpatient Care Team: Patient Care Team: Idamae Schuller, MD as PCP - General  Inpatient Treatment Team: Treatment Team: Attending Provider: Lanier Clam, MD; Consulting Physician: Edison Pace, Md, MD; Rounding Team: Suzan Garibaldi, MD; Rounding Team: Pccm, Md, MD; Registered Nurse: Quincy Simmonds, RN; Registered Nurse: Elijah Birk, RN; Teleneurology Nurse: Benard Halsted, NT; Technician: Antoine Primas; Physical Therapist: Mathis Fare, PT; Pharmacist: Suzzanne Cloud, [facility]; Charge Nurse: May, Barbara J, RN   Problem List:   Principal Problem:   SBO (small bowel obstruction) ([place]) Active Problems:   GERD   Sinus bradycardia   HLD (hyperlipidemia)   Hypothermia   Prolonged QT interval   1 Day Post-Op  07/15/2022  Postop diagnosis: Small bowel obstruction with closed-loop ischemia of section of ileum  Procedure performed: Exploratory laparotomy, lysis of adhesions, small bowel resection with primary anastomosis  Surgeon:Matthew K Tsuei  Operative findings:  The patient had a very tight adhesion in the left lower pelvis from an adhesion to her remaining fallopian tube.  This had caused a closed-loop obstruction with ischemia of a 30 cm segment of the mid ileum.  There is no sign of perforation.    Assessment  Stabilizing  [facility] Stay = 3 days)  Plan:  -NG tube for postoperative ileus in the setting of emergent bowel resection with closed-loop obstruction. -Parenteral pain and nausea control -Mild acute postoperative anemia expected given urgent surgery.  Follow. -Electrolytes stable with no evidence of any renal failure.  Follow. -Evidence of polysubstance abuse with cocaine and cannabis.  Per primary service with protocols. -VTE prophylaxis- SCDs, etc -mobilize as tolerated to help recovery.  Sleeping in her chair maybe start walking.  May need therapies involved.  Suspect she  will need sundowning and fall precautions given her confusion.   Disposition:  Disposition:  The patient is from: Home  Anticipate discharge to:  [place] (SNF)  Anticipated Date of Discharge is:  December 28,2023    Barriers to discharge:  Social/Financial Barriers, Therapy assessment & Recommendations pending, and Pending Clinical improvement (more likely than not)  Patient currently is NOT MEDICALLY STABLE for discharge from the hospital from a surgery standpoint.      I reviewed nursing notes, last 24 h vitals and pain scores, last 48 h intake and output, last 24 h labs and trends, last 24 h imaging results, and Critical care primary service notes . I have reviewed this patient's available data, including medical history, events of note, test results, etc as part of my evaluation.  A significant portion of that time was spent in counseling.  Care during the described time interval was provided by me.  This care required moderate level of medical decision making.  07/16/2022    Subjective: (Chief complaint)  Extubated.  Off pressors.  Somewhat restless.  Thirsty.  Denies much abdominal pain.  Critical care nursing just outside room.  Objective:  Vital signs:  Vitals:   07/16/22 0400 07/16/22 0500 07/16/22 0600 07/16/22 0611  BP:      Pulse: 90 92 95 96  Resp: 16 19 (!) 21 (!) 24  Temp: 98.2 F (36.8 C)     TempSrc: Axillary     SpO2: 98% 95% (!) 88% 93%  Weight:      Height:        Last BM Date :  (PTA)  Intake/Output   Yesterday:  12/22 6440 -  12/23 0700 In: 4434.4 [I.V.:3698.9; NG/GT:90; IV Piggyback:645.5] Out: 1075 [Urine:825; Emesis/NG output:100; Drains:50; Blood:100] This shift:  No intake/output data recorded.  Bowel function:  Flatus: No  BM:  No  Drain: Nasogastric tube with thin bilious output    Physical Exam:  General: Pt awake/alert in mild acute distress.  Mildly restless and perseverating. Eyes: PERRL,  normal EOM.  Sclera clear.  No icterus Neuro: CN II-XII intact w/o focal sensory/motor deficits. Lymph: No head/neck/groin lymphadenopathy Psych:  No psychosis/paranoia.  Oriented x 2.  Mildly confused delirious but consolable and redirectable.  Not agitated nor belligerent. HENT: Normocephalic, Mucus membranes moist.  No thrush Neck: Supple, No tracheal deviation.  No obvious thyromegaly Chest: No pain to chest wall compression.  Good respiratory excursion.  No audible wheezing CV:  Pulses intact.  Regular rhythm.  No major extremity edema MS: Normal AROM mjr joints.  No obvious deformity  Abdomen: Soft.  Mildy distended.  Mildly tender at incisions only.  Wound VAC sponge in place with thinly serosanguineous effluent.  No evidence of peritonitis.  No incarcerated hernias.  Ext:   No deformity.  No mjr edema.  No cyanosis Skin: No petechiae / purpurea.  No major sores.  Warm and dry    Results:   Cultures: Recent Results (from the past 720 hour(s))  Culture, blood (routine x 2)     Status: None (Preliminary result)   Collection Time: 07/13/22  6:37 PM   Specimen: Right Antecubital; Blood  Result Value Ref Range Status   Specimen Description   Final    RIGHT ANTECUBITAL BLOOD Performed at [facility] [address] [place], [place] [address]    Special Requests   Final    Blood Culture results may not be optimal due to an excessive volume of blood received in culture bottles BOTTLES DRAWN AEROBIC AND ANAEROBIC Performed at [facility], [place] [address], [place], [place] [address]    Culture   Final    NO GROWTH 2 DAYS Performed at [place] [address], [place], [place] [address]    Report Status PENDING  Incomplete  Culture, blood (routine x 2)     Status: None (Preliminary result)   Collection Time: 07/13/22  6:50 PM   Specimen: BLOOD RIGHT FOREARM  Result Value Ref Range Status   Specimen Description   Final    BLOOD  RIGHT FOREARM BLOOD Performed at [facility] [address], [place], [place] [address]    Special Requests   Final    Blood Culture adequate volume BOTTLES DRAWN AEROBIC AND ANAEROBIC Performed at [facility] [address] [place], [place] [address]    Culture   Final    NO GROWTH 2 DAYS Performed at [facility] [address], [place],  [address]    Report Status PENDING  Incomplete  Resp panel by RT-PCR (RSV, Flu A&B, Covid) Anterior Nasal Swab     Status: None   Collection Time: 07/13/22  6:58 PM   Specimen: Anterior Nasal Swab  Result Value Ref Range Status   SARS Coronavirus 2 by RT PCR NEGATIVE NEGATIVE Final    Comment: (NOTE) SARS-CoV-2 target nucleic acids are NOT DETECTED.  The SARS-CoV-2 RNA is generally detectable in upper respiratory specimens during the acute phase of infection. The lowest concentration of SARS-CoV-2 viral copies this assay can detect is 138 copies/mL. A negative result does not preclude SARS-Cov-2 infection and should  not be used as the sole basis for treatment or other patient management decisions. A negative result may occur with  improper specimen collection/handling, submission of specimen other than nasopharyngeal swab, presence of viral mutation(s) within the areas targeted by this assay, and inadequate number of viral copies(<138 copies/mL). A negative result must be combined with clinical observations, patient history, and epidemiological information. The expected result is Negative.  Fact Sheet for Patients:  EntrepreneurPulse.com.au  Fact Sheet for Healthcare Providers:  IncredibleEmployment.be  This test is no t yet approved or cleared by the [place] FDA and  has been authorized for detection and/or diagnosis of SARS-CoV-2 by FDA under an Emergency Use Authorization (EUA). This EUA will remain  in effect (meaning this test can be  used) for the duration of the COVID-19 declaration under Section 564(b)(1) of the Act, 21 U.S.C.section 360bbb-3(b)(1), unless the authorization is terminated  or revoked sooner.       Influenza A by PCR NEGATIVE NEGATIVE Final   Influenza B by PCR NEGATIVE NEGATIVE Final    Comment: (NOTE) The Xpert Xpress SARS-CoV-2/FLU/RSV plus assay is intended as an aid in the diagnosis of influenza from Nasopharyngeal swab specimens and should not be used as a sole basis for treatment. Nasal washings and aspirates are unacceptable for Xpert Xpress SARS-CoV-2/FLU/RSV testing.  Fact Sheet for Patients: EntrepreneurPulse.com.au  Fact Sheet for Healthcare Providers: IncredibleEmployment.be  This test is not yet approved or cleared by the [place] FDA and has been authorized for detection and/or diagnosis of SARS-CoV-2 by FDA under an Emergency Use Authorization (EUA). This EUA will remain in effect (meaning this test can be used) for the duration of the COVID-19 declaration under Section 564(b)(1) of the Act, 21 U.S.C. section 360bbb-3(b)(1), unless the authorization is terminated or revoked.     Resp Syncytial Virus by PCR NEGATIVE NEGATIVE Final    Comment: (NOTE) Fact Sheet for Patients: EntrepreneurPulse.com.au  Fact Sheet for Healthcare Providers: IncredibleEmployment.be  This test is not yet approved or cleared by the [place] FDA and has been authorized for detection and/or diagnosis of SARS-CoV-2 by FDA under an Emergency Use Authorization (EUA). This EUA will remain in effect (meaning this test can be used) for the duration of the COVID-19 declaration under Section 564(b)(1) of the Act, 21 U.S.C. section 360bbb-3(b)(1), unless the authorization is terminated or revoked.  Performed at [facility], [place] [address], [place], [place] [address]   MRSA Next Gen by PCR, Nasal      Status: None   Collection Time: 07/14/22 12:08 AM   Specimen: Nasal Mucosa; Nasal Swab  Result Value Ref Range Status   MRSA by PCR Next Gen NOT DETECTED NOT DETECTED Final    Comment: (NOTE) The GeneXpert MRSA Assay (FDA approved for NASAL specimens only), is one component of a comprehensive MRSA colonization surveillance program. It is not intended to diagnose MRSA infection nor to guide or monitor treatment for MRSA infections. Test performance is not FDA approved in patients less than 6 years old. Performed at [facility], [place] [address], [place], [place] [address]     Labs: Results for orders placed or performed during the hospital encounter of 07/13/22 (from the past 48 hour(s))  CBC with Differential/Platelet     Status: Abnormal   Collection Time: 07/15/22  2:58 AM  Result Value Ref Range   WBC 14.6 (H) 4.0 - 10.5 K/uL   RBC 4.20 3.87 - 5.11 MIL/uL   Hemoglobin 12.9 12.0 -  15.0 g/dL   HCT 40.9 36.0 - 46.0 %   MCV 97.4 80.0 - 100.0 fL   MCH 30.7 26.0 - 34.0 pg   MCHC 31.5 30.0 - 36.0 g/dL   RDW 13.3 11.5 - 15.5 %   Platelets 209 150 - 400 K/uL   nRBC 0.0 0.0 - 0.2 %   Neutrophils Relative % 84 %   Neutro Abs 12.2 (H) 1.7 - 7.7 K/uL   Lymphocytes Relative 5 %   Lymphs Abs 0.8 0.7 - 4.0 K/uL   Monocytes Relative 10 %   Monocytes Absolute 1.5 (H) 0.1 - 1.0 K/uL   Eosinophils Relative 0 %   Eosinophils Absolute 0.0 0.0 - 0.5 K/uL   Basophils Relative 0 %   Basophils Absolute 0.0 0.0 - 0.1 K/uL   Immature Granulocytes 1 %   Abs Immature Granulocytes 0.07 0.00 - 0.07 K/uL    Comment: Performed at [facility], [place] [address] [place], [place] [address]  Basic metabolic panel     Status: Abnormal   Collection Time: 07/15/22  2:58 AM  Result Value Ref Range   Sodium 139 135 - 145 mmol/L   Potassium 4.1 3.5 - 5.1 mmol/L   Chloride 109 98 - 111 mmol/L   CO2 20 (L) 22 - 32 mmol/L   Glucose, Bld 163 (H) 70 - 99 mg/dL    Comment:  Glucose reference range applies only to samples taken after fasting for at least 8 hours.   BUN 27 (H) 8 - 23 mg/dL   Creatinine, Ser 1.23 (H) 0.44 - 1.00 mg/dL   Calcium 8.5 (L) 8.9 - 10.3 mg/dL   GFR, Estimated 48 (L) >60 mL/min    Comment: (NOTE) Calculated using the CKD-EPI Creatinine Equation (2021)    Anion gap 10 5 - 15    Comment: Performed at [facility], [place] [address] [place], [place] [address]  Magnesium     Status: None   Collection Time: 07/15/22  2:58 AM  Result Value Ref Range   Magnesium 1.8 1.7 - 2.4 mg/dL    Comment: Performed at [facility], [place] [address], [place], [place] [address]  Hemoglobin A1c     Status: Abnormal   Collection Time: 07/15/22  2:58 AM  Result Value Ref Range   Hgb A1c MFr Bld 6.0 (H) 4.8 - 5.6 %    Comment: (NOTE)         Prediabetes: 5.7 - 6.4         Diabetes: >6.4         Glycemic control for adults with diabetes: <7.0    Mean Plasma Glucose 126 mg/dL    Comment: (NOTE) Performed At: [facility] [place], [place] [id number] Rush Farmer MD OE:[id number]   Lactic acid, plasma     Status: None   Collection Time: 07/15/22  8:51 AM  Result Value Ref Range   Lactic Acid, Venous 1.7 0.5 - 1.9 mmol/L    Comment: Performed at [facility], [place] [address] [place], [place] [address]  Type and screen [place]     Status: None   Collection Time: 07/15/22 10:32 AM  Result Value Ref Range   ABO/RH(D) B POS    Antibody Screen NEG    Sample Expiration      07/18/2022,2359 Performed at [facility], [place] [address] [place], [place] [address]   Lactic acid, plasma  Status: None   Collection Time: 07/15/22 10:33 AM  Result Value Ref Range   Lactic Acid, Venous 1.7 0.5 - 1.9 mmol/L    Comment: Performed at [facility], [place] [address], [place], [place] [address]  Draw ABG 1 hour after initiation  of ventilator     Status: Abnormal   Collection Time: 07/15/22  2:45 PM  Result Value Ref Range   FIO2 100% %   MECHVT 470 mL   RATE 18 resp/min   PEEP 5 cm H20   pH, Arterial 7.43 7.35 - 7.45   pCO2 arterial 33 32 - 48 mmHg   pO2, Arterial 133 (H) 83 - 108 mmHg   Bicarbonate 22.1 20.0 - 28.0 mmol/L   Acid-base deficit 1.7 0.0 - 2.0 mmol/L   O2 Saturation 99.5 %   Patient temperature 36.6    Allens test (pass/fail) PASS PASS    Comment: Performed at [facility], [place] [address], [place], [place] [address]  Glucose, capillary     Status: Abnormal   Collection Time: 07/15/22  4:57 PM  Result Value Ref Range   Glucose-Capillary 178 (H) 70 - 99 mg/dL    Comment: Glucose reference range applies only to samples taken after fasting for at least 8 hours.   Comment 1 Notify RN    Comment 2 Document in Chart   Glucose, capillary     Status: Abnormal   Collection Time: 07/15/22  7:46 PM  Result Value Ref Range   Glucose-Capillary 106 (H) 70 - 99 mg/dL    Comment: Glucose reference range applies only to samples taken after fasting for at least 8 hours.  Comprehensive metabolic panel     Status: Abnormal   Collection Time: 07/16/22  4:30 AM  Result Value Ref Range   Sodium 142 135 - 145 mmol/L   Potassium 4.0 3.5 - 5.1 mmol/L   Chloride 110 98 - 111 mmol/L   CO2 28 22 - 32 mmol/L   Glucose, Bld 111 (H) 70 - 99 mg/dL    Comment: Glucose reference range applies only to samples taken after fasting for at least 8 hours.   BUN 21 8 - 23 mg/dL   Creatinine, Ser 0.84 0.44 - 1.00 mg/dL   Calcium 8.3 (L) 8.9 - 10.3 mg/dL   Total Protein 5.9 (L) 6.5 - 8.1 g/dL   Albumin 2.9 (L) 3.5 - 5.0 g/dL   AST 48 (H) 15 - 41 U/L   ALT 36 0 - 44 U/L   Alkaline Phosphatase 38 38 - 126 U/L   Total Bilirubin 0.5 0.3 - 1.2 mg/dL   GFR, Estimated >60 >60 mL/min    Comment: (NOTE) Calculated using the CKD-EPI Creatinine Equation (2021)    Anion gap 4 (L) 5 - 15    Comment: Performed at  [facility], [place] [address], [place], [place] [address]  CBC     Status: Abnormal   Collection Time: 07/16/22  4:30 AM  Result Value Ref Range   WBC 9.4 4.0 - 10.5 K/uL   RBC 3.06 (L) 3.87 - 5.11 MIL/uL   Hemoglobin 9.4 (L) 12.0 - 15.0 g/dL    Comment: REPEATED TO VERIFY   HCT 28.7 (L) 36.0 - 46.0 %   MCV 93.8 80.0 - 100.0 fL   MCH 30.7 26.0 - 34.0 pg   MCHC 32.8 30.0 - 36.0 g/dL   RDW 13.3 11.5 - 15.5 %   Platelets 128 (L) 150 - 400  K/uL   nRBC 0.0 0.0 - 0.2 %    Comment: Performed at [facility], [place] [address] [place], [place] [address]  Magnesium     Status: None   Collection Time: 07/16/22  4:30 AM  Result Value Ref Range   Magnesium 1.8 1.7 - 2.4 mg/dL    Comment: Performed at [facility], [place] [address], [place], [place] [address]    Imaging / Studies: DG Abd 1 View  Result Date: 07/15/2022 CLINICAL DATA:  NG tube placement EXAM: ABDOMEN - 1 VIEW COMPARISON:  07/15/2022, 4:48 a.m. FINDINGS: Interval advancement of esophagogastric tube, tip and side port well below the diaphragm. Similar appearance of gas-filled, mildly distended loops of small bowel throughout the mid abdomen, measuring up to 3.8 cm in caliber. No obvious free air on supine radiograph. IMPRESSION: 1. Interval advancement of esophagogastric tube, tip and side port well below the diaphragm. 2. Similar appearance of gas-filled, mildly distended loops of small bowel throughout the mid abdomen. Electronically Signed   By: Delanna Ahmadi M.D.   On: 07/15/2022 18:48   DG CHEST PORT 1 VIEW  Result Date: 07/15/2022 CLINICAL DATA:  Placement of endotracheal tube and NG tube EXAM: PORTABLE CHEST 1 VIEW COMPARISON:  Previous studies including the examination of 07/13/2022 FINDINGS: Cardiac size is within normal limits. There is interval appearance of new infiltrate in left lower lung field obscuring the left hemidiaphragm. Rest of the lung fields are clear. There  is no pneumothorax. Tip of endotracheal tube is 5.1 cm above the carina. Tip of NG tube is seen in the fundus of the stomach. Side port in the NG tube is seen at the gastroesophageal junction. IMPRESSION: Increased density in left lower lung fields suggest atelectasis/pneumonia and possibly pleural effusion. Tip of enteric tube is seen in the fundus of the stomach. Side-port in the enteric tube is noted at the gastroesophageal junction. The tube should be advanced 5-10 cm to place the side port within the stomach. Electronically Signed   By: Elmer Picker M.D.   On: 07/15/2022 14:12   DG Abd Portable 1V  Result Date: 07/15/2022 CLINICAL DATA:  Follow-up small bowel obstruction and nasogastric tube placement. EXAM: PORTABLE ABDOMEN - 1 VIEW COMPARISON:  07/14/2022 FINDINGS: Orogastric tube tip is located in the gastric fundus. Increased dilatation of multiple small bowel loops seen in the abdomen and upper pelvis, with a lack of colonic gas, consistent with small-bowel obstruction. Right upper quadrant surgical clips are seen from prior cholecystectomy. IMPRESSION: Nasogastric tube tip in gastric fundus. Increased small bowel dilatation, consistent with small-bowel obstruction. Electronically Signed   By: Marlaine Hind M.D.   On: 07/15/2022 05:07   DG Abd Portable 1V  Result Date: 07/14/2022 CLINICAL DATA:  [id number] Encounter for imaging study to confirm nasogastric (NG) tube placement [id number] EXAM: PORTABLE ABDOMEN - 1 VIEW COMPARISON:  Same day radiograph FINDINGS: Nausea gastric tube tip overlies the stomach, side port near the GE junction. Prominent loop of bowel in the right lower quadrant. Paucity of small bowel gas. Right upper quadrant surgical clips. IMPRESSION: Nasogastric tube tip overlies the stomach, side port near the GE junction Consider Vance mint by 2.5 cm. Focal prominent loop of gas distended bowel in the right lower quadrant, similar to prior exam. Paucity of small bowel gas.  Electronically Signed   By: Maurine Simmering M.D.   On: 07/14/2022 10:33   ECHOCARDIOGRAM COMPLETE  Result Date: 07/14/2022    ECHOCARDIOGRAM REPORT  Patient Name:   MALKA BOCEK Date of Exam: 07/14/2022 Medical Rec #:  [id number]     Height:       66.0 in Accession #:    [id number]    Weight:       126.0 lb Date of Birth:  12-05-1955     BSA:          1.643 m Patient Age:    66 years      BP:           137/76 mmHg Patient Gender: F             HR:           88 bpm. Exam Location:  Inpatient Procedure: 2D Echo, Color Doppler and Cardiac Doppler Indications:    R94.31 Abnormal EKG  History:        Patient has no prior history of Echocardiogram examinations.                 Risk Factors:Dyslipidemia.  Sonographer:    Raquel Sarna Senior RDCS Referring Phys: [place]  1. Left ventricular ejection fraction, by estimation, is 65 to 70%. The left ventricle has normal function. Mild LV mid-cavity gradient, peak 18 mmHg. The left ventricle has no regional wall motion abnormalities. There is mild concentric left ventricular hypertrophy. Left ventricular diastolic parameters are consistent with Grade I diastolic dysfunction (impaired relaxation).  2. Right ventricular systolic function is normal. The right ventricular size is normal. Tricuspid regurgitation signal is inadequate for assessing PA pressure.  3. The aortic valve is tricuspid. Aortic valve regurgitation is not visualized. No aortic stenosis is present.  4. The mitral valve is normal in structure. No evidence of mitral valve regurgitation. No evidence of mitral stenosis.  5. The inferior vena cava is normal in size with greater than 50% respiratory variability, suggesting right atrial pressure of 3 mmHg. FINDINGS  Left Ventricle: Left ventricular ejection fraction, by estimation, is 65 to 70%. The left ventricle has normal function. The left ventricle has no regional wall motion abnormalities. The left ventricular internal cavity size was  normal in size. There is  mild concentric left ventricular hypertrophy. Left ventricular diastolic parameters are consistent with Grade I diastolic dysfunction (impaired relaxation). Right Ventricle: The right ventricular size is normal. No increase in right ventricular wall thickness. Right ventricular systolic function is normal. Tricuspid regurgitation signal is inadequate for assessing PA pressure. Left Atrium: Left atrial size was normal in size. Right Atrium: Right atrial size was normal in size. Pericardium: There is no evidence of pericardial effusion. Mitral Valve: The mitral valve is normal in structure. No evidence of mitral valve regurgitation. No evidence of mitral valve stenosis. Tricuspid Valve: The tricuspid valve is normal in structure. Tricuspid valve regurgitation is trivial. Aortic Valve: The aortic valve is tricuspid. Aortic valve regurgitation is not visualized. No aortic stenosis is present. Pulmonic Valve: The pulmonic valve was normal in structure. Pulmonic valve regurgitation is not visualized. Aorta: The aortic root is normal in size and structure. Venous: The inferior vena cava is normal in size with greater than 50% respiratory variability, suggesting right atrial pressure of 3 mmHg. IAS/Shunts: No atrial level shunt detected by color flow Doppler.  LEFT VENTRICLE PLAX 2D LVIDd:         3.90 cm   Diastology LVIDs:         2.70 cm   LV e' medial:    6.20 cm/s LV PW:  0.90 cm   LV E/e' medial:  6.8 LV IVS:        0.80 cm   LV e' lateral:   9.36 cm/s LVOT diam:     1.90 cm   LV E/e' lateral: 4.5 LV SV:         47 LV SV Index:   28 LVOT Area:     2.84 cm  RIGHT VENTRICLE RV S prime:     13.65 cm/s TAPSE (M-mode): 2.0 cm LEFT ATRIUM             Index        RIGHT ATRIUM          Index LA diam:        2.40 cm 1.46 cm/m   RA Area:     8.19 cm LA Vol (A2C):   26.7 ml 16.25 ml/m  RA Volume:   13.40 ml 8.15 ml/m LA Vol (A4C):   28.3 ml 17.22 ml/m LA Biplane Vol: 29.1 ml 17.71 ml/m   AORTIC VALVE LVOT Vmax:   104.00 cm/s LVOT Vmean:  67.000 cm/s LVOT VTI:    0.165 m  AORTA Ao Root diam: 2.80 cm Ao Asc diam:  2.70 cm MITRAL VALVE MV Area (PHT): 3.27 cm    SHUNTS MV Decel Time: 232 msec    Systemic VTI:  0.16 m MV E velocity: 42.40 cm/s  Systemic Diam: 1.90 cm MV A velocity: 73.70 cm/s MV E/A ratio:  0.58 Dalton McleanMD Electronically signed by Franki Monte Signature Date/Time: 07/14/2022/10:31:44 AM    Final    DG Abd Portable 1V-Small Bowel Obstruction Protocol-initial, 8 hr delay  Result Date: 07/14/2022 CLINICAL DATA:  Small bowel follow up EXAM: PORTABLE ABDOMEN - 1 VIEW COMPARISON:  Yesterday FINDINGS: Hazy/dilute contrast seen could be present within small bowel loops, no contrast seen in colon. Excreting contrast in the kidneys and bladder. No concerning mass effect or calcification. Ingested pills in the right lower quadrant. Bone island appearance at the left ilium. IMPRESSION: No contrast seen at the colon. Electronically Signed   By: Jorje Guild M.D.   On: 07/14/2022 07:39    Medications / Allergies: per chart  Antibiotics: Anti-infectives (From admission, onward)    Start     Dose/Rate Route Frequency Ordered Stop   07/15/22 1700  metroNIDAZOLE (FLAGYL) IVPB 500 mg        500 mg 100 mL/hr over 60 Minutes Intravenous Every 12 hours 07/15/22 1556     07/15/22 1600  cefTRIAXone (ROCEPHIN) 2 g in sodium chloride 0.9 % 100 mL IVPB        2 g 200 mL/hr over 30 Minutes Intravenous Daily 07/15/22 1454     07/15/22 1100  ceFAZolin (ANCEF) IVPB 2g/100 mL premix       See Hyperspace for full Linked Orders Report.   2 g 200 mL/hr over 30 Minutes Intravenous On call to O.R. 07/15/22 1000 07/15/22 1200   07/15/22 1100  metroNIDAZOLE (FLAGYL) IVPB 500 mg  Status:  Discontinued       See Hyperspace for full Linked Orders Report.   500 mg 100 mL/hr over 60 Minutes Intravenous On call to O.R. 07/15/22 1000 07/15/22 1119         Note: Portions of this report  may have been transcribed using voice recognition software. Every effort was made to ensure accuracy; however, inadvertent computerized transcription errors may be present.   Any transcriptional errors that result from this process are unintentional.  Adin Hector, MD, FACS, MASCRS Esophageal, Gastrointestinal & Colorectal Surgery Robotic and Minimally Invasive Surgery  [facility] [address] [place], [place] [address] [phone] Fax [phone] Main  CONTACT INFORMATION:  Weekday (9AM-5PM): Call [facility] main office at [phone]  Weeknight (5PM-9AM) or Weekend/Holiday: Check [email or web address] (password " [email or web address]") for General Surgery [facility] coverage  (Please, do not use SecureChat as it is not reliable communication to reach operating surgeons for immediate patient care given surgeries/outpatient duties/clinic/cross-coverage/off post-call which would lead to a delay in care.  Epic staff messaging available for outptient concerns, but may not be answered for 48 hours or more).     07/16/2022  7:08 AM

## 2022-07-16 NOTE — Progress Notes (Signed)
PROGRESS NOTE    [name]  ZWC:[id number] DOB: 11-18-1955 DOA: 07/13/2022 PCP: Idamae Schuller, MD     Brief Narrative:  H/o abdominal hysterectomy presents with ab pain, n/v, found to have sbo,has hypothermia, ng placed, admitted to progressive bed on warming blanket    Subjective:  POD #1, extubated, she appear weak, but alert and pleasant, denies pain, on ng suction, + wound vac, no flatus   Assessment & Plan:  Principal Problem:   SBO (small bowel obstruction) (HCC) Active Problems:   GERD   Sinus bradycardia   HLD (hyperlipidemia)   Hypothermia   Prolonged QT interval   Sbo S/p Exploratory laparotomy, lysis of adhesions, small bowel resection with primary anastomosis , post op intubated , POD#1 extubated, off pressor On rocephin/flagyl Appreciate gen surg  and critical care input  Hypothermia, resolved  Blood culture no growth Cortisol level elevated   Sinus bradycardia, resolved  Tsh 0.6 Resolved, echocardiogram no acute findings,  monitor   Qtc 490 Keep K>4, mag >2 Improved on Repeat EKG   Impaired fasting blood glucose Denies h/o DM2, possible due to stress A1c 6%  UDS+ cocaine, THC  .   I have Reviewed nursing notes, Vitals, pain scores, I/o's, Lab results and  imaging results since pt's last encounter, details please see discussion above  I ordered the following labs:  Unresulted Labs (From admission, onward)     Start     Ordered   07/17/22 0500  CBC with Differential/Platelet  Tomorrow morning,   R       Question:  Specimen collection method  Answer:  Unit=Unit collect   07/16/22 1855   07/17/22 0500  Comprehensive metabolic panel  Tomorrow morning,   R       Question:  Specimen collection method  Answer:  Unit=Unit collect   07/16/22 1855   07/17/22 0500  Magnesium  Tomorrow morning,   R       Question:  Specimen collection method  Answer:  Unit=Unit collect   07/16/22 1855   07/17/22 0500  Phosphorus  Tomorrow morning,   R        Question:  Specimen collection method  Answer:  Unit=Unit collect   07/16/22 1855   07/15/22 0749  Urinalysis, Routine w reflex microscopic  Once,   R        07/15/22 0749             DVT prophylaxis: SCDs Start: 07/14/22 0009   Code Status:   Code Status: Full Code  Family Communication: none at bedside Disposition:    Dispo: The patient is from: home              Anticipated d/c is to: home              Anticipated d/c date is: >72hrs  Antimicrobials:   Anti-infectives (From admission, onward)    Start     Dose/Rate Route Frequency Ordered Stop   07/15/22 1700  metroNIDAZOLE (FLAGYL) IVPB 500 mg        500 mg 100 mL/hr over 60 Minutes Intravenous Every 12 hours 07/15/22 1556     07/15/22 1600  cefTRIAXone (ROCEPHIN) 2 g in sodium chloride 0.9 % 100 mL IVPB        2 g 200 mL/hr over 30 Minutes Intravenous Daily 07/15/22 1454     07/15/22 1100  ceFAZolin (ANCEF) IVPB 2g/100 mL premix       See Hyperspace for full Linked Orders Report.  2 g 200 mL/hr over 30 Minutes Intravenous On call to O.R. 07/15/22 1000 07/15/22 1200   07/15/22 1100  metroNIDAZOLE (FLAGYL) IVPB 500 mg  Status:  Discontinued       See Hyperspace for full Linked Orders Report.   500 mg 100 mL/hr over 60 Minutes Intravenous On call to O.R. 07/15/22 1000 07/15/22 1119           Objective: Vitals:   07/16/22 1543 07/16/22 1600 07/16/22 1700 07/16/22 1800  BP:      Pulse:   83   Resp:  '20 11 18  '$ Temp: 98.7 F (37.1 C)     TempSrc: Oral     SpO2:  98% 98%   Weight:      Height:        Intake/Output Summary (Last 24 hours) at 07/16/2022 1855 Last data filed at 07/16/2022 1600 Gross per 24 hour  Intake 2581.77 ml  Output 1775 ml  Net 806.77 ml   Filed Weights   07/15/22 1112  Weight: 54.4 kg    Examination:  General exam: aaox3, pleasant  Respiratory system: Clear to auscultation.  Cardiovascular system:  RRR.  Gastrointestinal system: Abdomen post op changes., hypoactive  bowel sounds, + wound vac Central nervous system: aaox3 Extremities:  no edema Skin: No rashes, lesions or ulcers Psychiatry: pleasant     Data Reviewed: I have personally reviewed  labs and visualized  imaging studies since the last encounter and formulate the plan        Scheduled Meds:  arformoterol  15 mcg Nebulization BID   bisacodyl  10 mg Rectal Daily   budesonide (PULMICORT) nebulizer solution  0.5 mg Nebulization BID   Chlorhexidine Gluconate Cloth  6 each Topical Daily   lip balm   Topical BID   mouth rinse  15 mL Mouth Rinse 4 times per day   pantoprazole (PROTONIX) IV  40 mg Intravenous Daily   Continuous Infusions:  sodium chloride 15 mL/hr at 07/16/22 1600   cefTRIAXone (ROCEPHIN)  IV Stopped (07/16/22 0932)   lactated ringers     methocarbamol (ROBAXIN) IV     metronidazole 500 mg (07/16/22 1726)     LOS: 3 days     Amanda Reasons, MD PhD FACP [facility]  Available via Epic secure chat 7am-7pm for nonurgent issues Please page for urgent issues To page the attending provider between 7A-7P or the covering provider during after hours 7P-7A, please log into the web site [email or web address] and access using universal  password for that web site. If you do not have the password, please call the hospital operator.    07/16/2022, 6:55 PM

## 2022-07-16 NOTE — Consult Note (Signed)
NAME:  [name], MRN:  [id number], DOB:  17-May-1956, LOS: 3 ADMISSION DATE:  07/13/2022, CONSULTATION DATE:  12/22  REFERRING MD:  Dr. Erlinda Hong, CHIEF COMPLAINT:  Hypotension    History of Present Illness:  66 y/o F who presented by EMS to [facility] ER with reports of nausea, vomiting, fever, chills and weakness.    The patient was hypotensive, hypothermic, bradycardic with HR in the 30's on presentation to ER.  She was given atropine for bradycardia.  She was altered initially with unclear history. KUB was negative. UDS positive for cocaine, & THC.  QTc noted to be 490 on admit.  CT ABD/Pelvis completed which showed dilated & thick-walled loops of small bowel clustered within the pelvis with internal hernia neck in the the left pelvis consistent with small bowel obstruction.  Engorgement of the mesenteric vessels and small bowel wall thickening concerning for ischemia. No free air or pneumatosis.  NGT was placed for decompression. She had increasing abdominal pain.  General Surgery was consulted and the patient was taken on 12/22 for exploratory laparotomy with lysis of adhesions, small bowel resection with primary anastomosis.  She had intra-operative hypotension and the patient remained intubated, transferred to ICU.   PCCM consulted for assistance with ICU care.    Pertinent  Medical History  Bradycardia  HLD GERD  Depression  Goiter Tobacco Abuse  THC Abuse  ETOH Abuse  Abdominal Hysterectomy  Cholecystectomy  Significant Hospital Events: Including procedures, antibiotic start and stop dates in addition to other pertinent events   12/20 Admit  12/22 PCCM consulted post operative: hypotension, vent needs  Interim History / Subjective:  Afebrile  On fentanyl infusion, propofol   Objective   Blood pressure 126/69, pulse 88, temperature 98.3 F (36.8 C), temperature source Oral, resp. rate 19, height '5\' 6"'$  (1.676 m), weight 54.4 kg, last menstrual period 11/08/1991, SpO2 96 %.     Vent Mode: PSV;CPAP FiO2 (%):  [40 %-100 %] 40 % Set Rate:  [16 bmp-18 bmp] 18 bmp Vt Set:  [470 mL] 470 mL PEEP:  [5 cmH20] 5 cmH20 Pressure Support:  [5 cmH20] 5 cmH20 Plateau Pressure:  [22 cmH20-30 cmH20] 30 cmH20   Intake/Output Summary (Last 24 hours) at 07/16/2022 1019 Last data filed at 07/16/2022 0911 Gross per 24 hour  Intake 3268.2 ml  Output 1075 ml  Net 2193.2 ml    Filed Weights   07/15/22 1112  Weight: 54.4 kg    Examination: General: adult female lying in bed on vent in NAD HENT: MM pink/moist, #7 ETT, arcus senilis, pupils 46m, anicteric  Lungs: non-labored at rest on vent, lungs bilaterally with coarse rhonchi, wheezing  Cardiovascular: s1s2 RRR, distant tones due to breath sounds, aline in place with good waveform Abdomen: soft, midline VAC in place Extremities: warm/dry, no edema  Neuro: sedate    Resolved Hospital Problem list   Hypothermia  Bradycardia   Assessment & Plan:   Postoperative ventilator management: -- Extubated PM 12/22 --Wean O2 as able  Nocturnal Desaturation: possible OSA vs central apnea with narcotics --Consider PSG as outpatient   Small bowel obstruction: Status post lysis of adhesion 12/22 -- NG to suction -- Appreciate surgery assistance   Acute kidney injury (resolved): Likely due to hypovolemia in setting of nausea and vomiting. -- Cr improved with IVF   Polysubstance abuse: Cocaine and THC positive UDS  Prolonged QTc  -follow up EKG  12/22 QTcB 476  Depression Chronic Pain  -hold home prozac -hold home  gabapentin   HLD  -hold home crestor  Transfer out of ICU, disposition pending O2 requirements  Best Practice (right click and "Reselect all SmartList Selections" daily)  Diet/type: NPO DVT prophylaxis: SCD GI prophylaxis: PPI Lines: Arterial Line and No longer needed.  Order written to d/c  Foley:  Yes, and it is still needed Code Status:  full code Last date of multidisciplinary goals of care  discussion:  pending  Labs   CBC: Recent Labs  Lab 07/13/22 1837 07/14/22 0043 07/15/22 0258 07/16/22 0430  WBC 4.5 6.9 14.6* 9.4  NEUTROABS 3.0  --  12.2*  --   HGB 15.2* 14.6 12.9 9.4*  HCT 45.4 44.6 40.9 28.7*  MCV 91.2 93.5 97.4 93.8  PLT 235 221 209 128*     Basic Metabolic Panel: Recent Labs  Lab 07/13/22 1837 07/13/22 2153 07/14/22 0043 07/15/22 0258 07/16/22 0430  NA 139  --  139 139 142  K 3.6  --  3.8 4.1 4.0  CL 104  --  109 109 110  CO2 21*  --  20* 20* 28  GLUCOSE 186*  --  179* 163* 111*  BUN 15  --  14 27* 21  CREATININE 0.98  --  0.95 1.23* 0.84  CALCIUM 10.2  --  9.1 8.5* 8.3*  MG 2.1  --  1.9 1.8 1.8  PHOS  --  3.2 4.5  --   --     GFR: Estimated Creatinine Clearance: 55.8 mL/min (by C-G formula based on SCr of 0.84 mg/dL). Recent Labs  Lab 07/13/22 1837 07/13/22 1845 07/13/22 2136 07/14/22 0043 07/15/22 0258 07/15/22 0851 07/15/22 1033 07/16/22 0430  WBC 4.5  --   --  6.9 14.6*  --   --  9.4  LATICACIDVEN  --  1.6 1.4  --   --  1.7 1.7  --      Liver Function Tests: Recent Labs  Lab 07/13/22 1837 07/14/22 0043 07/16/22 0430  AST 26 23 48*  ALT 25 22 36  ALKPHOS 68 58 38  BILITOT 1.0 0.7 0.5  PROT 8.4* 6.7 5.9*  ALBUMIN 4.3 3.3* 2.9*    Recent Labs  Lab 07/13/22 1837  LIPASE 29    No results for input(s): "AMMONIA" in the last 168 hours.  ABG    Component Value Date/Time   PHART 7.43 07/15/2022 1445   PCO2ART 33 07/15/2022 1445   PO2ART 133 (H) 07/15/2022 1445   HCO3 22.1 07/15/2022 1445   ACIDBASEDEF 1.7 07/15/2022 1445   O2SAT 99.5 07/15/2022 1445     Coagulation Profile: Recent Labs  Lab 07/13/22 2153  INR 1.0     Cardiac Enzymes: Recent Labs  Lab 07/13/22 2153  CKTOTAL 37*     HbA1C: Hgb A1c MFr Bld  Date/Time Value Ref Range Status  07/15/2022 02:58 AM 6.0 (H) 4.8 - 5.6 % Final    Comment:    (NOTE)         Prediabetes: 5.7 - 6.4         Diabetes: >6.4         Glycemic control for  adults with diabetes: <7.0     CBG: Recent Labs  Lab 07/13/22 1845 07/15/22 1657 07/15/22 1946  GLUCAP 181* 178* 106*     Review of Systems:   Unable to complete with patient due to mechanical ventilation   Past Medical History:  She,  has a past medical history of Back pain (07/07/2009), Bilateral knee pain (05/29/2012), Bradycardia (05/15/2006),  Bursitis of right shoulder (05/10/2010), Depression (05/15/2006), Ecchymoses, spontaneous (08/12/2010), GERD (gastroesophageal reflux disease) (05/15/2006), Inguinal pain (03/23/2010), Intertrigo (03/04/2014), Left hip pain (11/25/2019), Meralgia paresthetica of right side (05/08/2019), Personal history of goiter (05/15/2006), Right hand pain (11/25/2020), Shoulder pain, right (07/07/2009), Smoker (05/10/2010), and Tendinopathy of left rotator cuff (09/09/2016).   Surgical History:   Past Surgical History:  Procedure Laterality Date   ABDOMINAL HYSTERECTOMY     CHOLECYSTECTOMY       Social History:   reports that she has been smoking cigarettes. She has been smoking an average of .1 packs per day. She has never used smokeless tobacco. She reports current alcohol use. She reports current drug use. Frequency: 1.00 time per week. Drug: Marijuana.   Family History:  Her family history includes Cancer in her brother, daughter, mother, and sister; Diabetes in her father and mother; Kidney disease in her father; Mental illness in her sister; Stroke in her brother.   Allergies Allergies  Allergen Reactions   Aspirin Nausea And Vomiting    Other Reaction(s): Unknown     Home Medications  Prior to Admission medications   Medication Sig Start Date End Date Taking? Authorizing Provider  FLUoxetine (PROZAC) 40 MG capsule Take 80 mg by mouth daily.   Yes [provider]  gabapentin (NEURONTIN) 300 MG capsule Take 2 capsules (600 mg total) by mouth 3 (three) times daily. TAKE 2 CAPSULES(600 MG) BY MOUTH THREE TIMES DAILY Strength: 300  mg 04/12/22 04/12/23 Yes Idamae Schuller, MD  Multiple Vitamin (MULTIVITAMIN WITH MINERALS) TABS Take 1 tablet by mouth daily.   Yes [provider]  omeprazole (PRILOSEC) 40 MG capsule Take 40 mg by mouth daily.   Yes [provider]  rosuvastatin (CRESTOR) 20 MG tablet Take 1 tablet (20 mg total) by mouth daily. 04/14/22 04/14/23 Yes Idamae Schuller, MD  acetaminophen (TYLENOL) 500 MG tablet Take 1 tablet (500 mg total) by mouth every 6 (six) hours as needed. Patient not taking: Reported on 07/13/2022 10/12/14   Juluis Mire, MD  cetirizine (ZYRTEC ALLERGY) 10 MG tablet Take 1 tablet (10 mg total) by mouth daily. Patient not taking: Reported on 07/13/2022 04/12/22 04/12/23  Idamae Schuller, MD  omeprazole (PRILOSEC) 40 MG capsule Take 1 capsule (40 mg total) by mouth daily. 04/12/22 07/11/22  Idamae Schuller, MD     Critical care time: n/a    Lanier Clam, MD [facility] 07/16/2022, 10:19 AM   Please see [email or web address] for pager details.   From 7A-7P if no response, please call [phone] After hours, please call ELink [phone]

## 2022-07-16 NOTE — Progress Notes (Signed)
PT Cancellation Note  Patient Details Name: [name] MRN: [id number] DOB: 05/06/1956   Cancelled Treatment:     PT order received but eval deferred at request of pt 2* fatigue and increased pain.  Pt agreeable to mobilize with PT in am.  Will follow.   Zolton Dowson 07/16/2022, 4:33 PM

## 2022-07-16 NOTE — Progress Notes (Signed)
eLink Physician-Brief Progress Note Patient Name: [name] DOB: [birth year] MRN: [id number]   Date of Service  07/16/2022  HPI/Events of Note  Agitated - Pulling off [place] and dropping sats. Pulled out NGT.   eICU Interventions  Plan: Versed 0.5 mg IV X 1. Replace NGT to LIS. Obtain abdominal film post NGT placement to confirm placement.      Intervention Category Major Interventions: Delirium, psychosis, severe agitation - evaluation and management  [name] 07/16/2022, 10:45 PM

## 2022-07-16 NOTE — Progress Notes (Signed)
[facility] ADULT ICU REPLACEMENT PROTOCOL   The patient does apply for the [facility] Adult ICU Electrolyte Replacment Protocol based on the criteria listed below:   1.Exclusion criteria: TCTS, ECMO, Dialysis, and Myasthenia Gravis patients 2. Is GFR >/= 30 ml/min? Yes.    Patient's GFR today is >60 3. Is SCr </= 2? Yes.   Patient's SCr is 0.84 mg/dL 4. Did SCr increase >/= 0.5 in 24 hours? No. 5.Pt's weight >40kg  Yes.   6. Abnormal electrolyte(s): Mag 1.8  7. Electrolytes replaced per protocol 8.  Call MD STAT for K+ </= 2.5, Phos </= 1, or Mag </= 1 Physician:  Eula Fried [facility] 07/16/2022 5:38 AM

## 2022-07-17 ENCOUNTER — Inpatient Hospital Stay (HOSPITAL_COMMUNITY): Payer: Medicare Other

## 2022-07-17 DIAGNOSIS — K56609 Unspecified intestinal obstruction, unspecified as to partial versus complete obstruction: Secondary | ICD-10-CM | POA: Diagnosis not present

## 2022-07-17 DIAGNOSIS — K559 Vascular disorder of intestine, unspecified: Secondary | ICD-10-CM | POA: Insufficient documentation

## 2022-07-17 LAB — CBC WITH DIFFERENTIAL/PLATELET
Abs Immature Granulocytes: 0.02 10*3/uL (ref 0.00–0.07)
Basophils Absolute: 0 10*3/uL (ref 0.0–0.1)
Basophils Relative: 0 %
Eosinophils Absolute: 0 10*3/uL (ref 0.0–0.5)
Eosinophils Relative: 0 %
HCT: 24.1 % — ABNORMAL LOW (ref 36.0–46.0)
Hemoglobin: 7.9 g/dL — ABNORMAL LOW (ref 12.0–15.0)
Immature Granulocytes: 0 %
Lymphocytes Relative: 10 %
Lymphs Abs: 0.6 10*3/uL — ABNORMAL LOW (ref 0.7–4.0)
MCH: 31.1 pg (ref 26.0–34.0)
MCHC: 32.8 g/dL (ref 30.0–36.0)
MCV: 94.9 fL (ref 80.0–100.0)
Monocytes Absolute: 0.4 10*3/uL (ref 0.1–1.0)
Monocytes Relative: 7 %
Neutro Abs: 4.7 10*3/uL (ref 1.7–7.7)
Neutrophils Relative %: 83 %
Platelets: 112 10*3/uL — ABNORMAL LOW (ref 150–400)
RBC: 2.54 MIL/uL — ABNORMAL LOW (ref 3.87–5.11)
RDW: 13.2 % (ref 11.5–15.5)
WBC: 5.7 10*3/uL (ref 4.0–10.5)
nRBC: 0 % (ref 0.0–0.2)

## 2022-07-17 LAB — COMPREHENSIVE METABOLIC PANEL
ALT: 72 U/L — ABNORMAL HIGH (ref 0–44)
AST: 86 U/L — ABNORMAL HIGH (ref 15–41)
Albumin: 2.2 g/dL — ABNORMAL LOW (ref 3.5–5.0)
Alkaline Phosphatase: 33 U/L — ABNORMAL LOW (ref 38–126)
Anion gap: 5 (ref 5–15)
BUN: 12 mg/dL (ref 8–23)
CO2: 23 mmol/L (ref 22–32)
Calcium: 7.2 mg/dL — ABNORMAL LOW (ref 8.9–10.3)
Chloride: 117 mmol/L — ABNORMAL HIGH (ref 98–111)
Creatinine, Ser: 0.56 mg/dL (ref 0.44–1.00)
GFR, Estimated: >60 mL/min (ref >60)
Glucose, Bld: 72 mg/dL (ref 70–99)
Potassium: 2.8 mmol/L — ABNORMAL LOW (ref 3.5–5.1)
Sodium: 145 mmol/L (ref 135–145)
Total Bilirubin: 0.7 mg/dL (ref 0.3–1.2)
Total Protein: 4.8 g/dL — ABNORMAL LOW (ref 6.5–8.1)

## 2022-07-17 LAB — PHOSPHORUS: Phosphorus: 1.7 mg/dL — ABNORMAL LOW (ref 2.5–4.6)

## 2022-07-17 LAB — MAGNESIUM: Magnesium: 1.8 mg/dL (ref 1.7–2.4)

## 2022-07-17 MED ORDER — POTASSIUM PHOSPHATES 15 MMOLE/5ML IV SOLN
15.0000 mmol | Freq: Once | INTRAVENOUS | Status: AC
  Administered 2022-07-17: 15 mmol via INTRAVENOUS
  Filled 2022-07-17: qty 5

## 2022-07-17 MED ORDER — POTASSIUM CHLORIDE 10 MEQ/100ML IV SOLN
10.0000 meq | INTRAVENOUS | Status: AC
  Administered 2022-07-17 (×6): 10 meq via INTRAVENOUS
  Filled 2022-07-17 (×6): qty 100

## 2022-07-17 MED ORDER — MAGNESIUM SULFATE 2 GM/50ML IV SOLN
2.0000 g | Freq: Once | INTRAVENOUS | Status: AC
  Administered 2022-07-17: 2 g via INTRAVENOUS
  Filled 2022-07-17: qty 50

## 2022-07-17 MED ORDER — HYDROMORPHONE HCL 1 MG/ML IJ SOLN
0.5000 mg | INTRAMUSCULAR | Status: DC | PRN
  Administered 2022-07-17 – 2022-07-20 (×5): 1 mg via INTRAVENOUS
  Filled 2022-07-17 (×5): qty 1

## 2022-07-17 MED ORDER — SODIUM PHOSPHATES 45 MMOLE/15ML IV SOLN: 15.0000 mmol | Freq: Once | INTRAVENOUS | Status: DC

## 2022-07-17 MED ORDER — HYDRALAZINE HCL 20 MG/ML IJ SOLN
10.0000 mg | INTRAMUSCULAR | Status: DC | PRN
  Administered 2022-07-17: 10 mg via INTRAVENOUS
  Filled 2022-07-17: qty 1

## 2022-07-17 NOTE — Evaluation (Signed)
Physical Therapy Evaluation Patient Details Name: [name] MRN: [id number] DOB: 01-26-1956 Today's Date: 07/17/2022  History of Present Illness  Pt admitted from home 07/13/22 with abdominal pain 2* SBO and not s/p Exploratory lap, lysis of adhesions, and small bowel resection with primary anastomosis  Clinical Impression  Pt admitted as above and presenting with functional mobility limitations 2* generalized weakness, post op pain and ambulatory balance deficits.  Pt should progress to dc home with family assist.     Recommendations for follow up therapy are one component of a multi-disciplinary discharge planning process, led by the attending physician.  Recommendations may be updated based on patient status, additional functional criteria and insurance authorization.  Follow Up Recommendations No PT follow up      Assistance Recommended at Discharge PRN  Patient can return home with the following  A little help with walking and/or transfers;A little help with bathing/dressing/bathroom;Assistance with cooking/housework;Assist for transportation;Help with stairs or ramp for entrance    Equipment Recommendations None recommended by PT  Recommendations for Other Services       Functional Status Assessment Patient has had a recent decline in their functional status and demonstrates the ability to make significant improvements in function in a reasonable and predictable amount of time.     Precautions / Restrictions Precautions Precautions: Fall;Other (comment) Precaution Comments: s/p abdominal surgery Restrictions Weight Bearing Restrictions: No      Mobility  Bed Mobility Overal bed mobility: Needs Assistance Bed Mobility: Rolling, Sidelying to Sit Rolling: Min guard Sidelying to sit: Min assist       General bed mobility comments: cues for log roll technique with min assist to bring trunk to upright    Transfers Overall transfer level: Needs  assistance Equipment used: Rolling walker (2 wheels) Transfers: Sit to/from Stand Sit to Stand: Min assist           General transfer comment: cues for use of UEs to self assist    Ambulation/Gait Ambulation/Gait assistance: Min assist, +2 safety/equipment Gait Distance (Feet): 180 Feet Assistive device: Rolling walker (2 wheels) Gait Pattern/deviations: Step-to pattern, Step-through pattern, Decreased step length - right, Decreased step length - left, Shuffle, Trunk flexed Gait velocity: decr     General Gait Details: Increased time with cues for posture and position from [employer]            Wheelchair Mobility    Modified Rankin (Stroke Patients Only)       Balance Overall balance assessment: Needs assistance Sitting-balance support: No upper extremity supported, Feet supported Sitting balance-Leahy Scale: Good     Standing balance support: Bilateral upper extremity supported Standing balance-Leahy Scale: Poor                               Pertinent Vitals/Pain Pain Assessment Pain Assessment: 0-10 Pain Score: 3  Pain Location: abdominal pain Pain Descriptors / Indicators: Sore Pain Intervention(s): Limited activity within patient's tolerance, Monitored during session, Premedicated before session    Home Living Family/patient expects to be discharged to:: Private residence Living Arrangements: Children Available Help at Discharge: Family;Available 24 hours/day Type of Home: House Home Access: Ramped entrance       Home Layout: One level Home Equipment: Rollator (4 wheels)      Prior Function Prior Level of Function : Independent/Modified Independent  Hand Dominance        Extremity/Trunk Assessment   Upper Extremity Assessment Upper Extremity Assessment: Generalized weakness    Lower Extremity Assessment Lower Extremity Assessment: Generalized weakness       Communication   Communication:  No difficulties  Cognition Arousal/Alertness: Awake/alert Behavior During Therapy: WFL for tasks assessed/performed Overall Cognitive Status: Within Functional Limits for tasks assessed                                          General Comments      Exercises     Assessment/Plan    PT Assessment Patient needs continued PT services  PT Problem List Decreased strength;Decreased range of motion;Decreased activity tolerance;Decreased balance;Decreased mobility;Decreased knowledge of use of DME;Pain       PT Treatment Interventions DME instruction;Gait training;Functional mobility training;Therapeutic activities;Therapeutic exercise;Balance training;Patient/family education    PT Goals (Current goals can be found in the Care Plan section)  Acute Rehab PT Goals Patient Stated Goal: Regain IND PT Goal Formulation: With patient Time For Goal Achievement: 07/31/22 Potential to Achieve Goals: Good    Frequency Min 3X/week     Co-evaluation               AM-PAC PT "6 Clicks" Mobility  Outcome Measure Help needed turning from your back to your side while in a flat bed without using bedrails?: A Little Help needed moving from lying on your back to sitting on the side of a flat bed without using bedrails?: A Little Help needed moving to and from a bed to a chair (including a wheelchair)?: A Little Help needed standing up from a chair using your arms (e.g., wheelchair or bedside chair)?: A Little Help needed to walk in hospital room?: A Little Help needed climbing 3-5 steps with a railing? : A Lot 6 Click Score: 17    End of Session Equipment Utilized During Treatment: Gait belt Activity Tolerance: Patient tolerated treatment well Patient left: Other (comment) (BSC) Nurse Communication: Mobility status PT Visit Diagnosis: Unsteadiness on feet (R26.81);Muscle weakness (generalized) (M62.81);Difficulty in walking, not elsewhere classified (R26.2)    Time:  0981-1914 PT Time Calculation (min) (ACUTE ONLY): 27 min   Charges:   PT Evaluation $PT Eval Low Complexity: 1 Low PT Treatments $Gait Training: 8-22 mins        Debe Coder PT Acute Rehabilitation Services Pager [phone] Office [phone]   Damonica Chopra 07/17/2022, 5:02 PM

## 2022-07-17 NOTE — Progress Notes (Addendum)
PROGRESS NOTE    [name]  XLK:[id number] DOB: [birth year] DOA: 07/13/2022 PCP: Idamae Schuller, MD     Brief Narrative:  H/o abdominal hysterectomy presents with ab pain, n/v, found to have sbo,has hypothermia, ng placed, admitted to progressive bed on warming blanket    Subjective:  POD #2,  Report NG " fell out" last night She is sitting up in chair , chewing ice chips, denies ab pain, reports + flatus and possible small bm  She reports some productive cough, no fever, she is currently on 5liter HFNC.    Assessment & Plan:  Principal Problem:   Closed loop SBO with necrosis s/p SB resection 07/15/2022 Active Problems:   GERD   Sinus bradycardia   HLD (hyperlipidemia)   SBO (small bowel obstruction) (HCC)   Hypothermia   Prolonged QT interval   Sbo S/p Exploratory laparotomy, lysis of adhesions, small bowel resection with primary anastomosis , post op intubated , POD#1 extubated, off pressor On rocephin/flagyl Appreciate gen surg  and critical care input  Possible left lower lobe pneumonia Blood culture no growth  Already on abx, encourage incentive spirometer, sputum culture if able  Hypokalemia/hypomagnesemia/hypophosphatemia Replace by IV, recheck in the morning  Hypothermia, resolved  Blood culture no growth Cortisol level elevated   Sinus bradycardia, resolved  Tsh 0.6 Resolved, echocardiogram no acute findings,  monitor   Qtc 490 Keep K>4, mag >2 Improved on Repeat EKG   Impaired fasting blood glucose Denies h/o DM2, possible due to stress A1c 6%  UDS+ cocaine, THC  Cigarette smoking ,smoking cessation education provided, she declined nicotine patch   .   I have Reviewed nursing notes, Vitals, pain scores, I/o's, Lab results and  imaging results since pt's last encounter, details please see discussion above  I ordered the following labs:  Unresulted Labs (From admission, onward)     Start     Ordered   07/18/22 0500  CBC  Daily,   R      Question:  Specimen collection method  Answer:  Lab=Lab collect   07/17/22 0824   07/18/22 0500  Comprehensive metabolic panel  Daily,   R     Question:  Specimen collection method  Answer:  Lab=Lab collect   07/17/22 0824   07/18/22 0500  Magnesium  Tomorrow morning,   R       Question:  Specimen collection method  Answer:  Lab=Lab collect   07/17/22 0824   07/18/22 0500  Phosphorus  Daily,   R     Question:  Specimen collection method  Answer:  Lab=Lab collect   07/17/22 0824   07/15/22 0749  Urinalysis, Routine w reflex microscopic  Once,   R        07/15/22 0749             DVT prophylaxis: SCDs Start: 07/14/22 0009   Code Status:   Code Status: Full Code  Family Communication: none at bedside Disposition:    Dispo: The patient is from: home              Anticipated d/c is to: pending Pt eval              Anticipated d/c date is: >48hrs,  wean oxygen, transfer out of stepdown to med tele, need gen surg clearance   Antimicrobials:   Anti-infectives (From admission, onward)    Start     Dose/Rate Route Frequency Ordered Stop   07/15/22 1700  metroNIDAZOLE (FLAGYL) IVPB 500  mg        500 mg 100 mL/hr over 60 Minutes Intravenous Every 12 hours 07/15/22 1556     07/15/22 1600  cefTRIAXone (ROCEPHIN) 2 g in sodium chloride 0.9 % 100 mL IVPB        2 g 200 mL/hr over 30 Minutes Intravenous Daily 07/15/22 1454     07/15/22 1100  ceFAZolin (ANCEF) IVPB 2g/100 mL premix       See Hyperspace for full Linked Orders Report.   2 g 200 mL/hr over 30 Minutes Intravenous On call to O.R. 07/15/22 1000 07/15/22 1200   07/15/22 1100  metroNIDAZOLE (FLAGYL) IVPB 500 mg  Status:  Discontinued       See Hyperspace for full Linked Orders Report.   500 mg 100 mL/hr over 60 Minutes Intravenous On call to O.R. 07/15/22 1000 07/15/22 1119           Objective: Vitals:   07/17/22 0622 07/17/22 0700 07/17/22 0736 07/17/22 0803  BP: (!) 169/70     Pulse:      Resp: 20 (!) 29     Temp:    98.6 F (37 C)  TempSrc:    Oral  SpO2:   98%   Weight:      Height:        Intake/Output Summary (Last 24 hours) at 07/17/2022 0828 Last data filed at 07/17/2022 0600 Gross per 24 hour  Intake 838.65 ml  Output 1000 ml  Net -161.35 ml   Filed Weights   07/15/22 1112  Weight: 54.4 kg    Examination:  General exam: aaox3, pleasant , edentulous  Respiratory system: left lower lobe rhonchi, normal respiratory effort   Cardiovascular system:  RRR.  Gastrointestinal system: Abdomen post op changes., hypoactive bowel sounds, + wound vac Central nervous system: aaox3 Extremities:  no edema Skin: No rashes, lesions or ulcers Psychiatry: pleasant     Data Reviewed: I have personally reviewed  labs and visualized  imaging studies since the last encounter and formulate the plan        Scheduled Meds:  arformoterol  15 mcg Nebulization BID   bisacodyl  10 mg Rectal Daily   budesonide (PULMICORT) nebulizer solution  0.5 mg Nebulization BID   Chlorhexidine Gluconate Cloth  6 each Topical Daily   lip balm   Topical BID   mouth rinse  15 mL Mouth Rinse 4 times per day   pantoprazole (PROTONIX) IV  40 mg Intravenous Daily   Continuous Infusions:  sodium chloride 15 mL/hr at 07/16/22 1600   cefTRIAXone (ROCEPHIN)  IV Stopped (07/16/22 0932)   lactated ringers     magnesium sulfate bolus IVPB     methocarbamol (ROBAXIN) IV     metronidazole Stopped (07/17/22 0619)   potassium chloride     sodium phosphate 15 mmol in dextrose 5 % 250 mL infusion       LOS: 4 days     Florencia Reasons, MD PhD FACP [facility]  Available via Epic secure chat 7am-7pm for nonurgent issues Please page for urgent issues To page the attending provider between 7A-7P or the covering provider during after hours 7P-7A, please log into the web site [email or web address] and access using universal [place] password for that web site. If you do not have the password, please call the hospital  operator.    07/17/2022, 8:28 AM

## 2022-07-17 NOTE — Progress Notes (Signed)
eLink Physician-Brief Progress Note Patient Name: KLOHE LOVERING DOB: April 13, 1956 MRN: [id number]   Date of Service  07/17/2022  HPI/Events of Note  Agitation - Pulled out NGT X 2. Nursing request for bilateral wrist restraints.  eICU Interventions  Will order bilateral soft wrist restraints X 5 hours.      Intervention Category Major Interventions: Delirium, psychosis, severe agitation - evaluation and management  Dewanda Fennema [place] 07/17/2022, 4:08 AM

## 2022-07-17 NOTE — Progress Notes (Signed)
[name] [id number] 23-Sep-1955  CARE TEAM:  PCP: Idamae Schuller, MD  Outpatient Care Team: Patient Care Team: Idamae Schuller, MD as PCP - General  Inpatient Treatment Team: Treatment Team: Attending Provider: Lanier Clam, MD; Consulting Physician: Edison Pace, Md, MD; Rounding Team: Pccm, Md, MD; Utilization Review: Erlenmeyer, Areta Haber, RN; Technician: Antoine Primas; Pharmacist: Joycelyn Rua, [facility]; Rounding Team: Suzan Garibaldi, MD; Charge Nurse: May, Barbara J, RN; Physical Therapist: Mathis Fare, PT; Registered Nurse: York Spaniel, RN   Problem List:   Principal Problem:   Closed loop SBO with necrosis s/p SB resection 07/15/2022 Active Problems:   GERD   Sinus bradycardia   HLD (hyperlipidemia)   SBO (small bowel obstruction) ([place])   Hypothermia   Prolonged QT interval   2 Days Post-Op  07/15/2022  Postop diagnosis: Small bowel obstruction with closed-loop ischemia of section of ileum  Procedure performed: Exploratory laparotomy, lysis of adhesions, small bowel resection with primary anastomosis  Surgeon:Matthew K Tsuei  Operative findings:  The patient had a very tight adhesion in the left lower pelvis from an adhesion to her remaining fallopian tube.  This had caused a closed-loop obstruction with ischemia of a 30 cm segment of the mid ileum.  There is no sign of perforation.    Assessment  Stabilizing  [facility] Stay = 4 days)  Plan:  -NG tube for postoperative ileus in the setting of emergent bowel resection with closed-loop obstruction with necrosis -NG tube "fell out".  Will try and keep it out for now with low threshold to return it.  She is not massively distended nor nauseated and most likely with her confusion will pull out the next one so try and hold off.  Sips/ice chips only.  Once has flatus/bowel movements then can attempt liquids.  Stable to transfer to floor from surgery standpoint.  Still oxygen requiring.  Defer to  critical care/internal medicine.  -Parenteral pain and nausea control  -Mild acute postoperative anemia expected given urgent surgery.  Follow.  Transfuse if hemoglobin falls below 7.  -Electrolytes stable with no evidence of any renal failure.  Follow.  -Evidence of polysubstance abuse with cocaine and cannabis.  Per primary service with protocols. -VTE prophylaxis- SCDs, etc. may need to hold chemical prophylaxis if hemoglobin falls below 7.  -mobilize as tolerated to help recovery.  Sleeping in her chair maybe start walking.  May need therapies involved.  Suspect she will need sundowning and fall precautions given her confusion.   Disposition:  Disposition:  The patient is from: Home  Anticipate discharge to:  [facility] (SNF)  Anticipated Date of Discharge is:  December 28,2023    Barriers to discharge:  Social/Financial Barriers, Therapy assessment & Recommendations pending, and Pending Clinical improvement (more likely than not)  Patient currently is NOT MEDICALLY STABLE for discharge from the hospital from a surgery standpoint.      I reviewed nursing notes, last 24 h vitals and pain scores, last 48 h intake and output, last 24 h labs and trends, last 24 h imaging results, and Critical care primary service notes . I have reviewed this patient's available data, including medical history, events of note, test results, etc as part of my evaluation.  A significant portion of that time was spent in counseling.  Care during the described time interval was provided by me.  This care required moderate level of medical decision making.  07/17/2022    Subjective: (Chief complaint)  NG tube  found outside body.  Patient denies pulling it then it just "came out".  Nursing at bedside.  Patient notes mild abdominal soreness.  No nausea or vomiting.  Remains off pressors.  Still on moderate oxygen.  Weaning.  Arterial line about to be  removed.  Objective:  Vital signs:  Vitals:   07/17/22 0600 07/17/22 0622 07/17/22 0700 07/17/22 0736  BP:  (!) 169/70    Pulse:      Resp: 20 20 (!) 29   Temp:      TempSrc:      SpO2:    98%  Weight:      Height:        Last BM Date :  (PTA)  Intake/Output   Yesterday:  12/23 0701 - 12/24 0700 In: 3419 [I.V.:888.5; NG/GT:30; IV Piggyback:148.4] Out: 1000 [Urine:600; Emesis/NG output:400] This shift:  No intake/output data recorded.  Bowel function:  Flatus: No  BM:  No  Drain: (NONE)   Physical Exam:  General: Pt awake/alert in no acute distress.  More calm today.  Eyes: PERRL, normal EOM.  Sclera clear.  No icterus Neuro: CN II-XII intact w/o focal sensory/motor deficits. Lymph: No head/neck/groin lymphadenopathy Psych:  No psychosis/paranoia.  Oriented x 2.  Mildly confused but consolable and redirectable.  Not agitated nor belligerent. HENT: Normocephalic, Mucus membranes moist.  No thrush Neck: Supple, No tracheal deviation.  No obvious thyromegaly Chest: No pain to chest wall compression.  Good respiratory excursion.  No audible wheezing CV:  Pulses intact.  Regular rhythm.  No major extremity edema MS: Normal AROM mjr joints.  No obvious deformity  Abdomen: Soft.  Nondistended.  Mildly tender at incisions only.  Wound VAC sponge in place with thinly serosanguineous effluent.  No evidence of peritonitis.  No incarcerated hernias.  Ext:   No deformity.  No mjr edema.  No cyanosis Skin: No petechiae / purpurea.  No major sores.  Warm and dry    Results:   Cultures: Recent Results (from the past 720 hour(s))  Culture, blood (routine x 2)     Status: None (Preliminary result)   Collection Time: 07/13/22  6:37 PM   Specimen: Right Antecubital; Blood  Result Value Ref Range Status   Specimen Description   Final    RIGHT ANTECUBITAL BLOOD Performed at [facility] [address], [place], [place] [address]    Special Requests    Final    Blood Culture results may not be optimal due to an excessive volume of blood received in culture bottles BOTTLES DRAWN AEROBIC AND ANAEROBIC Performed at [facility], [place] [address] [place], [place] [address]    Culture   Final    NO GROWTH 3 DAYS Performed at [facility], [place] [address] [place], [place] [address]    Report Status PENDING  Incomplete  Culture, blood (routine x 2)     Status: None (Preliminary result)   Collection Time: 07/13/22  6:50 PM   Specimen: BLOOD RIGHT FOREARM  Result Value Ref Range Status   Specimen Description   Final    BLOOD RIGHT FOREARM BLOOD Performed at [facility] [address], [place], [place] [address]    Special Requests   Final    Blood Culture adequate volume BOTTLES DRAWN AEROBIC AND ANAEROBIC Performed at [facility] [address], [place], [place] [address]    Culture   Final    NO GROWTH 3 DAYS Performed at  [facility], [place] [address], [place], [place] [address]    Report Status PENDING  Incomplete  Resp panel by RT-PCR (RSV, Flu A&B, Covid) Anterior Nasal Swab     Status: None   Collection Time: 07/13/22  6:58 PM   Specimen: Anterior Nasal Swab  Result Value Ref Range Status   SARS Coronavirus 2 by RT PCR NEGATIVE NEGATIVE Final    Comment: (NOTE) SARS-CoV-2 target nucleic acids are NOT DETECTED.  The SARS-CoV-2 RNA is generally detectable in upper respiratory specimens during the acute phase of infection. The lowest concentration of SARS-CoV-2 viral copies this assay can detect is 138 copies/mL. A negative result does not preclude SARS-Cov-2 infection and should not be used as the sole basis for treatment or other patient management decisions. A negative result may occur with  improper specimen collection/handling, submission of specimen other than nasopharyngeal swab, presence of viral mutation(s) within the areas targeted by this assay,  and inadequate number of viral copies(<138 copies/mL). A negative result must be combined with clinical observations, patient history, and epidemiological information. The expected result is Negative.  Fact Sheet for Patients:  EntrepreneurPulse.com.au  Fact Sheet for Healthcare Providers:  IncredibleEmployment.be  This test is no t yet approved or cleared by the [place] FDA and  has been authorized for detection and/or diagnosis of SARS-CoV-2 by FDA under an Emergency Use Authorization (EUA). This EUA will remain  in effect (meaning this test can be used) for the duration of the COVID-19 declaration under Section 564(b)(1) of the Act, 21 U.S.C.section 360bbb-3(b)(1), unless the authorization is terminated  or revoked sooner.       Influenza A by PCR NEGATIVE NEGATIVE Final   Influenza B by PCR NEGATIVE NEGATIVE Final    Comment: (NOTE) The Xpert Xpress SARS-CoV-2/FLU/RSV plus assay is intended as an aid in the diagnosis of influenza from Nasopharyngeal swab specimens and should not be used as a sole basis for treatment. Nasal washings and aspirates are unacceptable for Xpert Xpress SARS-CoV-2/FLU/RSV testing.  Fact Sheet for Patients: EntrepreneurPulse.com.au  Fact Sheet for Healthcare Providers: IncredibleEmployment.be  This test is not yet approved or cleared by the [place] FDA and has been authorized for detection and/or diagnosis of SARS-CoV-2 by FDA under an Emergency Use Authorization (EUA). This EUA will remain in effect (meaning this test can be used) for the duration of the COVID-19 declaration under Section 564(b)(1) of the Act, 21 U.S.C. section 360bbb-3(b)(1), unless the authorization is terminated or revoked.     Resp Syncytial Virus by PCR NEGATIVE NEGATIVE Final    Comment: (NOTE) Fact Sheet for Patients: EntrepreneurPulse.com.au  Fact Sheet for  Healthcare Providers: IncredibleEmployment.be  This test is not yet approved or cleared by the [place] FDA and has been authorized for detection and/or diagnosis of SARS-CoV-2 by FDA under an Emergency Use Authorization (EUA). This EUA will remain in effect (meaning this test can be used) for the duration of the COVID-19 declaration under Section 564(b)(1) of the Act, 21 U.S.C. section 360bbb-3(b)(1), unless the authorization is terminated or revoked.  Performed at [facility], [place] [address], [place], [place] [address]   MRSA Next Gen by PCR, Nasal     Status: None   Collection Time: 07/14/22 12:08 AM   Specimen: Nasal Mucosa; Nasal Swab  Result Value Ref Range Status   MRSA by PCR Next Gen NOT DETECTED NOT DETECTED Final    Comment: (NOTE) The GeneXpert MRSA Assay (FDA approved for NASAL specimens only),  is one component of a comprehensive MRSA colonization surveillance program. It is not intended to diagnose MRSA infection nor to guide or monitor treatment for MRSA infections. Test performance is not FDA approved in patients less than 66 years old. Performed at [facility], [place] [address], [place], [place] [address]   Culture, blood (Routine X 2) w Reflex to ID Panel     Status: None (Preliminary result)   Collection Time: 07/15/22  8:53 AM   Specimen: BLOOD  Result Value Ref Range Status   Specimen Description   Final    BLOOD BLOOD LEFT HAND Performed at [facility] [address] [place], [place] [address]    Special Requests   Final    BOTTLES DRAWN AEROBIC AND ANAEROBIC Blood Culture results may not be optimal due to an inadequate volume of blood received in culture bottles Performed at [facility] [address], [place], [place] [address]    Culture   Final    NO GROWTH < 24 HOURS Performed at [facility] [address], [place], [place] [address]     Report Status PENDING  Incomplete  Culture, blood (Routine X 2) w Reflex to ID Panel     Status: None (Preliminary result)   Collection Time: 07/15/22  8:53 AM   Specimen: BLOOD  Result Value Ref Range Status   Specimen Description   Final    BLOOD BLOOD LEFT ARM Performed at [facility] [address], [place], [place] [address]    Special Requests   Final    BOTTLES DRAWN AEROBIC AND ANAEROBIC Blood Culture results may not be optimal due to an inadequate volume of blood received in culture bottles Performed at [facility] [address] [place], [place] [address]    Culture   Final    NO GROWTH < 24 HOURS Performed at [facility] [address], [place], [place] [address]    Report Status PENDING  Incomplete    Labs: Results for orders placed or performed during the hospital encounter of 07/13/22 (from the past 48 hour(s))  Lactic acid, plasma     Status: None   Collection Time: 07/15/22  8:51 AM  Result Value Ref Range   Lactic Acid, Venous 1.7 0.5 - 1.9 mmol/L    Comment: Performed at [facility], [place] [address] [place], [place] [address]  Culture, blood (Routine X 2) w Reflex to ID Panel     Status: None (Preliminary result)   Collection Time: 07/15/22  8:53 AM   Specimen: BLOOD  Result Value Ref Range   Specimen Description      BLOOD BLOOD LEFT HAND Performed at [facility] [address] [place], [place] [address]    Special Requests      BOTTLES DRAWN AEROBIC AND ANAEROBIC Blood Culture results may not be optimal due to an inadequate volume of blood received in culture bottles Performed at [facility], [place] [address] [place], [place] [address]    Culture      NO GROWTH < 24 HOURS Performed at [facility] [address], [place],  [address]    Report Status PENDING   Culture, blood (Routine X 2) w Reflex to ID Panel     Status: None  (Preliminary result)   Collection Time: 07/15/22  8:53 AM   Specimen: BLOOD  Result Value Ref Range  Specimen Description      BLOOD BLOOD LEFT ARM Performed at [facility] [address] [place], [place] [address]    Special Requests      BOTTLES DRAWN AEROBIC AND ANAEROBIC Blood Culture results may not be optimal due to an inadequate volume of blood received in culture bottles Performed at [facility], [place] [address] [place], [place] [address]    Culture      NO GROWTH < 24 HOURS Performed at [facility] [address], [place], [place] [address]    Report Status PENDING   Type and screen [place]     Status: None   Collection Time: 07/15/22 10:32 AM  Result Value Ref Range   ABO/RH(D) B POS    Antibody Screen NEG    Sample Expiration      07/18/2022,2359 Performed at [facility], [place] [address], [place], [place] [address]   Lactic acid, plasma     Status: None   Collection Time: 07/15/22 10:33 AM  Result Value Ref Range   Lactic Acid, Venous 1.7 0.5 - 1.9 mmol/L    Comment: Performed at [facility], [place] [address] [place], [place] [address]  Draw ABG 1 hour after initiation of ventilator     Status: Abnormal   Collection Time: 07/15/22  2:45 PM  Result Value Ref Range   FIO2 100% %   MECHVT 470 mL   RATE 18 resp/min   PEEP 5 cm H20   pH, Arterial 7.43 7.35 - 7.45   pCO2 arterial 33 32 - 48 mmHg   pO2, Arterial 133 (H) 83 - 108 mmHg   Bicarbonate 22.1 20.0 - 28.0 mmol/L   Acid-base deficit 1.7 0.0 - 2.0 mmol/L   O2 Saturation 99.5 %   Patient temperature 36.6    Allens test (pass/fail) PASS PASS    Comment: Performed at [facility], [place] [address], [place],  [address]  Glucose, capillary     Status: Abnormal   Collection Time: 07/15/22  4:57 PM  Result Value Ref Range   Glucose-Capillary 178 (H) 70 - 99 mg/dL    Comment:  Glucose reference range applies only to samples taken after fasting for at least 8 hours.   Comment 1 Notify RN    Comment 2 Document in Chart   Glucose, capillary     Status: Abnormal   Collection Time: 07/15/22  7:46 PM  Result Value Ref Range   Glucose-Capillary 106 (H) 70 - 99 mg/dL    Comment: Glucose reference range applies only to samples taken after fasting for at least 8 hours.  Comprehensive metabolic panel     Status: Abnormal   Collection Time: 07/16/22  4:30 AM  Result Value Ref Range   Sodium 142 135 - 145 mmol/L   Potassium 4.0 3.5 - 5.1 mmol/L   Chloride 110 98 - 111 mmol/L   CO2 28 22 - 32 mmol/L   Glucose, Bld 111 (H) 70 - 99 mg/dL    Comment: Glucose reference range applies only to samples taken after fasting for at least 8 hours.   BUN 21 8 - 23 mg/dL   Creatinine, Ser 0.84 0.44 - 1.00 mg/dL   Calcium 8.3 (L) 8.9 - 10.3 mg/dL   Total Protein 5.9 (L) 6.5 - 8.1 g/dL   Albumin 2.9 (L) 3.5 - 5.0 g/dL   AST 48 (H) 15 - 41 U/L  ALT 36 0 - 44 U/L   Alkaline Phosphatase 38 38 - 126 U/L   Total Bilirubin 0.5 0.3 - 1.2 mg/dL   GFR, Estimated >60 >60 mL/min    Comment: (NOTE) Calculated using the CKD-EPI Creatinine Equation (2021)    Anion gap 4 (L) 5 - 15    Comment: Performed at [facility], [place] [address], [place], [place] [address]  CBC     Status: Abnormal   Collection Time: 07/16/22  4:30 AM  Result Value Ref Range   WBC 9.4 4.0 - 10.5 K/uL   RBC 3.06 (L) 3.87 - 5.11 MIL/uL   Hemoglobin 9.4 (L) 12.0 - 15.0 g/dL    Comment: REPEATED TO VERIFY   HCT 28.7 (L) 36.0 - 46.0 %   MCV 93.8 80.0 - 100.0 fL   MCH 30.7 26.0 - 34.0 pg   MCHC 32.8 30.0 - 36.0 g/dL   RDW 13.3 11.5 - 15.5 %   Platelets 128 (L) 150 - 400 K/uL   nRBC 0.0 0.0 - 0.2 %    Comment: Performed at [facility], [place] [address], [place], [place] [address]  Magnesium     Status: None   Collection Time: 07/16/22  4:30 AM  Result Value Ref Range   Magnesium  1.8 1.7 - 2.4 mg/dL    Comment: Performed at [facility], [place] [address] [place], [place] [address]  CBC with Differential/Platelet     Status: Abnormal   Collection Time: 07/17/22  5:00 AM  Result Value Ref Range   WBC 5.7 4.0 - 10.5 K/uL   RBC 2.54 (L) 3.87 - 5.11 MIL/uL   Hemoglobin 7.9 (L) 12.0 - 15.0 g/dL   HCT 24.1 (L) 36.0 - 46.0 %   MCV 94.9 80.0 - 100.0 fL   MCH 31.1 26.0 - 34.0 pg   MCHC 32.8 30.0 - 36.0 g/dL   RDW 13.2 11.5 - 15.5 %   Platelets 112 (L) 150 - 400 K/uL   nRBC 0.0 0.0 - 0.2 %   Neutrophils Relative % 83 %   Neutro Abs 4.7 1.7 - 7.7 K/uL   Lymphocytes Relative 10 %   Lymphs Abs 0.6 (L) 0.7 - 4.0 K/uL   Monocytes Relative 7 %   Monocytes Absolute 0.4 0.1 - 1.0 K/uL   Eosinophils Relative 0 %   Eosinophils Absolute 0.0 0.0 - 0.5 K/uL   Basophils Relative 0 %   Basophils Absolute 0.0 0.0 - 0.1 K/uL   Immature Granulocytes 0 %   Abs Immature Granulocytes 0.02 0.00 - 0.07 K/uL    Comment: Performed at [facility], [place] [address], [place], [place] [address]    Imaging / Studies: DG Abd 1 View  Result Date: 07/15/2022 CLINICAL DATA:  NG tube placement EXAM: ABDOMEN - 1 VIEW COMPARISON:  07/15/2022, 4:48 a.m. FINDINGS: Interval advancement of esophagogastric tube, tip and side port well below the diaphragm. Similar appearance of gas-filled, mildly distended loops of small bowel throughout the mid abdomen, measuring up to 3.8 cm in caliber. No obvious free air on supine radiograph. IMPRESSION: 1. Interval advancement of esophagogastric tube, tip and side port well below the diaphragm. 2. Similar appearance of gas-filled, mildly distended loops of small bowel throughout the mid abdomen. Electronically Signed   By: Delanna Ahmadi M.D.   On: 07/15/2022 18:48   DG CHEST PORT 1 VIEW  Result Date: 07/15/2022 CLINICAL DATA:  Placement of endotracheal tube and NG tube  EXAM: PORTABLE CHEST 1 VIEW COMPARISON:  Previous studies  including the examination of 07/13/2022 FINDINGS: Cardiac size is within normal limits. There is interval appearance of new infiltrate in left lower lung field obscuring the left hemidiaphragm. Rest of the lung fields are clear. There is no pneumothorax. Tip of endotracheal tube is 5.1 cm above the carina. Tip of NG tube is seen in the fundus of the stomach. Side port in the NG tube is seen at the gastroesophageal junction. IMPRESSION: Increased density in left lower lung fields suggest atelectasis/pneumonia and possibly pleural effusion. Tip of enteric tube is seen in the fundus of the stomach. Side-port in the enteric tube is noted at the gastroesophageal junction. The tube should be advanced 5-10 cm to place the side port within the stomach. Electronically Signed   By: Elmer Picker M.D.   On: 07/15/2022 14:12    Medications / Allergies: per chart  Antibiotics: Anti-infectives (From admission, onward)    Start     Dose/Rate Route Frequency Ordered Stop   07/15/22 1700  metroNIDAZOLE (FLAGYL) IVPB 500 mg        500 mg 100 mL/hr over 60 Minutes Intravenous Every 12 hours 07/15/22 1556     07/15/22 1600  cefTRIAXone (ROCEPHIN) 2 g in sodium chloride 0.9 % 100 mL IVPB        2 g 200 mL/hr over 30 Minutes Intravenous Daily 07/15/22 1454     07/15/22 1100  ceFAZolin (ANCEF) IVPB 2g/100 mL premix       See Hyperspace for full Linked Orders Report.   2 g 200 mL/hr over 30 Minutes Intravenous On call to O.R. 07/15/22 1000 07/15/22 1200   07/15/22 1100  metroNIDAZOLE (FLAGYL) IVPB 500 mg  Status:  Discontinued       See Hyperspace for full Linked Orders Report.   500 mg 100 mL/hr over 60 Minutes Intravenous On call to O.R. 07/15/22 1000 07/15/22 1119         Note: Portions of this report may have been transcribed using voice recognition software. Every effort was made to ensure accuracy; however, inadvertent computerized transcription errors may be present.   Any transcriptional errors  that result from this process are unintentional.    Adin Hector, MD, FACS, MASCRS Esophageal, Gastrointestinal & Colorectal Surgery Robotic and Minimally Invasive Surgery  [facility]. [address] [place], [place] [address] [phone] Fax [phone] Main  CONTACT INFORMATION:  Weekday (9AM-5PM): Call [facility] main office at [phone]  Weeknight (5PM-9AM) or Weekend/Holiday: Check [email or web address] (password " [email or web address]") for General Surgery [facility] coverage  (Please, do not use SecureChat as it is not reliable communication to reach operating surgeons for immediate patient care given surgeries/outpatient duties/clinic/cross-coverage/off post-call which would lead to a delay in care.  Epic staff messaging available for outptient concerns, but may not be answered for 48 hours or more).     07/17/2022  7:58 AM

## 2022-07-17 NOTE — Progress Notes (Signed)
eLink Physician-Brief Progress Note Patient Name: [name] DOB: October 08, 1955 MRN: [id number]   Date of Service  07/17/2022  HPI/Events of Note  Hypertension - BP = 178/74.  eICU Interventions  Plan: Hydralazine 10 mg IV Q 4 hours PRN SBP > 160 or DBP > 100.      Intervention Category Major Interventions: Hypertension - evaluation and management  Elliyah Liszewski [place] 07/17/2022, 2:42 AM

## 2022-07-18 ENCOUNTER — Inpatient Hospital Stay (HOSPITAL_COMMUNITY): Payer: Medicare Other

## 2022-07-18 DIAGNOSIS — K56609 Unspecified intestinal obstruction, unspecified as to partial versus complete obstruction: Secondary | ICD-10-CM | POA: Diagnosis not present

## 2022-07-18 LAB — CULTURE, BLOOD (ROUTINE X 2)
Culture: NO GROWTH
Culture: NO GROWTH
Special Requests: ADEQUATE

## 2022-07-18 LAB — COMPREHENSIVE METABOLIC PANEL
ALT: 73 U/L — ABNORMAL HIGH (ref 0–44)
AST: 57 U/L — ABNORMAL HIGH (ref 15–41)
Albumin: 2.5 g/dL — ABNORMAL LOW (ref 3.5–5.0)
Alkaline Phosphatase: 39 U/L (ref 38–126)
Anion gap: 7 (ref 5–15)
BUN: 12 mg/dL (ref 8–23)
CO2: 27 mmol/L (ref 22–32)
Calcium: 8.2 mg/dL — ABNORMAL LOW (ref 8.9–10.3)
Chloride: 103 mmol/L (ref 98–111)
Creatinine, Ser: 0.77 mg/dL (ref 0.44–1.00)
GFR, Estimated: >60 mL/min (ref >60)
Glucose, Bld: 96 mg/dL (ref 70–99)
Potassium: 3.9 mmol/L (ref 3.5–5.1)
Sodium: 137 mmol/L (ref 135–145)
Total Bilirubin: 0.8 mg/dL (ref 0.3–1.2)
Total Protein: 5.5 g/dL — ABNORMAL LOW (ref 6.5–8.1)

## 2022-07-18 LAB — CBC
HCT: 28.7 % — ABNORMAL LOW (ref 36.0–46.0)
Hemoglobin: 9.4 g/dL — ABNORMAL LOW (ref 12.0–15.0)
MCH: 30.9 pg (ref 26.0–34.0)
MCHC: 32.8 g/dL (ref 30.0–36.0)
MCV: 94.4 fL (ref 80.0–100.0)
Platelets: 154 10*3/uL (ref 150–400)
RBC: 3.04 MIL/uL — ABNORMAL LOW (ref 3.87–5.11)
RDW: 13.2 % (ref 11.5–15.5)
WBC: 6 10*3/uL (ref 4.0–10.5)
nRBC: 0 % (ref 0.0–0.2)

## 2022-07-18 LAB — EXPECTORATED SPUTUM ASSESSMENT W GRAM STAIN, RFLX TO RESP C

## 2022-07-18 LAB — MAGNESIUM: Magnesium: 2.4 mg/dL (ref 1.7–2.4)

## 2022-07-18 LAB — PHOSPHORUS: Phosphorus: 1.7 mg/dL — ABNORMAL LOW (ref 2.5–4.6)

## 2022-07-18 MED ORDER — POTASSIUM PHOSPHATES 15 MMOLE/5ML IV SOLN
15.0000 mmol | Freq: Once | INTRAVENOUS | Status: AC
  Administered 2022-07-18: 15 mmol via INTRAVENOUS
  Filled 2022-07-18: qty 5

## 2022-07-18 MED ORDER — BOOST / RESOURCE BREEZE PO LIQD CUSTOM
1.0000 | Freq: Three times a day (TID) | ORAL | Status: DC
  Administered 2022-07-18 – 2022-07-19 (×3): 1 via ORAL

## 2022-07-18 MED ORDER — GUAIFENESIN 100 MG/5ML PO LIQD
5.0000 mL | ORAL | Status: DC | PRN
  Administered 2022-07-18: 5 mL via ORAL
  Filled 2022-07-18: qty 10

## 2022-07-18 MED ORDER — OXYCODONE HCL 5 MG PO TABS
5.0000 mg | ORAL_TABLET | ORAL | Status: DC | PRN
  Administered 2022-07-18 – 2022-07-19 (×4): 5 mg via ORAL
  Filled 2022-07-18 (×5): qty 1

## 2022-07-18 NOTE — Progress Notes (Signed)
3 Days Post-Op   Subjective/Chief Complaint: Feels well. Hungry. Reports 2 bowel movements and flatus. Denies pain or nausea. Wants to go home.   Objective: Vital signs in last 24 hours: Temp:  [97.8 F (36.6 C)-99 F (37.2 C)] 98.9 F (37.2 C) (12/25 0546) Pulse Rate:  [72-84] 81 (12/25 0546) Resp:  [16-19] 17 (12/25 0546) BP: (103-137)/(52-82) 123/69 (12/25 0546) SpO2:  [96 %-100 %] 96 % (12/25 0623) Last BM Date : 07/18/22  Intake/Output from previous day: 12/24 0701 - 12/25 0700 In: 1736.3 [P.O.:150; I.V.:288; IV Piggyback:1298.3] Out: 0  Intake/Output this shift: No intake/output data recorded.  A&Ox3 Unlabored respirations Abdomen soft, nondistended, minimal/appropriate tenderness. Vac in place.   Lab Results:  Recent Labs    07/17/22 0500 07/18/22 0524  WBC 5.7 6.0  HGB 7.9* 9.4*  HCT 24.1* 28.7*  PLT 112* 154   BMET Recent Labs    07/17/22 0500 07/18/22 0524  NA 145 137  K 2.8* 3.9  CL 117* 103  CO2 23 27  GLUCOSE 72 96  BUN 12 12  CREATININE 0.56 0.77  CALCIUM 7.2* 8.2*   PT/INR No results for input(s): "LABPROT", "INR" in the last 72 hours. ABG Recent Labs    07/15/22 1445  PHART 7.43  HCO3 22.1    Studies/Results: No results found.  Anti-infectives: Anti-infectives (From admission, onward)    Start     Dose/Rate Route Frequency Ordered Stop   07/15/22 1700  metroNIDAZOLE (FLAGYL) IVPB 500 mg        500 mg 100 mL/hr over 60 Minutes Intravenous Every 12 hours 07/15/22 1556     07/15/22 1600  cefTRIAXone (ROCEPHIN) 2 g in sodium chloride 0.9 % 100 mL IVPB        2 g 200 mL/hr over 30 Minutes Intravenous Daily 07/15/22 1454     07/15/22 1100  ceFAZolin (ANCEF) IVPB 2g/100 mL premix       See Hyperspace for full Linked Orders Report.   2 g 200 mL/hr over 30 Minutes Intravenous On call to O.R. 07/15/22 1000 07/15/22 1200   07/15/22 1100  metroNIDAZOLE (FLAGYL) IVPB 500 mg  Status:  Discontinued       See Hyperspace for full Linked  Orders Report.   500 mg 100 mL/hr over 60 Minutes Intravenous On call to O.R. 07/15/22 1000 07/15/22 1119       Assessment/Plan:  Closed loop SBO - CT on admission with question of internal hernia - WBC and lactic acid w/ repeat normal on admit - history of abdominal hysterectomy and likely open cholecystectomy - Exploratory laparotomy, lysis of adhesions, small bowel resection with primary anastomosis on 12/22 Dr. Georgette Dover, appears to be progressing well -Wound vac changes MW F   FEN: try clear liquids, IVF/ replace phosphorus per primary ID: rocephin/flagyl 12/22--> VTE: okay for chemical ppx from surgical perspective   Per primary AMS/hypothermia/bradycardia on admit  GERD HLD UDS positive for cocaine     I reviewed hospitalist notes, last 24 h vitals and pain scores, last 48 h intake and output, last 24 h labs and trends, and last 24 h imaging results.    LOS: 5 days    [name] 07/18/2022

## 2022-07-18 NOTE — Progress Notes (Addendum)
PROGRESS NOTE    [name]  JJK:[id number] DOB: 1956-02-15 DOA: 07/13/2022 PCP: Idamae Schuller, MD     Brief Narrative:  H/o abdominal hysterectomy presents with ab pain, n/v, found to have sbo,has hypothermia, ng placed, admitted to progressive bed on warming blanket    Subjective:  POD #3, started liquids  diet this am started to have diarrhea, denies ab pain, no n/v, still having productive cough, denies sob, she states she is going walk several times today     Assessment & Plan:  Principal Problem:   Closed loop SBO with necrosis s/p SB resection 07/15/2022 Active Problems:   GERD   Sinus bradycardia   HLD (hyperlipidemia)   SBO (small bowel obstruction) (HCC)   Hypothermia   Prolonged QT interval   Sbo S/p Exploratory laparotomy, lysis of adhesions, small bowel resection with primary anastomosis , post op intubated ,extubated, off pressor on POD#1 + wound vac, On rocephin/flagyl Started to have bm, diet advancement per gen surg Appreciate gen surg    Pneumonia? Blood culture no growth  Continue to cough, cough is productive, denies chest pain, denies sob Lung exam bilateral lower lobe rhonchi Will repeat cxr, f/u on sputum culture Already on abx, encourage incentive spirometer, mobilize   Hypokalemia/hypomagnesemia Replaced and normalized   hypophosphatemia Remain low, Replace by IV, recheck in the morning  Hypothermia, POA, resolved  Blood culture no growth Cortisol level elevated   Sinus bradycardia, POA,  resolved  Tsh 0.6 Resolved, echocardiogram no acute findings,  monitor   Qtc 490 Keep K>4, mag >2 Resolved on Repeat EKG  Discontinue tele  Impaired fasting blood glucose Denies h/o DM2, possible due to stress A1c 6% Normalized   UDS+ cocaine, THC  Cigarette smoking ,smoking cessation education provided, she declined nicotine patch   .   I have Reviewed nursing notes, Vitals, pain scores, I/o's, Lab results and  imaging results  since pt's last encounter, details please see discussion above  I ordered the following labs:  Unresulted Labs (From admission, onward)     Start     Ordered   07/18/22 0846  Culture, Respiratory w Gram Stain  Once,   R        07/18/22 0846   07/18/22 0500  CBC  Daily,   R     Question:  Specimen collection method  Answer:  Lab=Lab collect   07/17/22 0824   07/18/22 0500  Comprehensive metabolic panel  Daily,   R     Question:  Specimen collection method  Answer:  Lab=Lab collect   07/17/22 0824   07/18/22 0500  Phosphorus  Daily,   R     Question:  Specimen collection method  Answer:  Lab=Lab collect   07/17/22 0824   07/15/22 0749  Urinalysis, Routine w reflex microscopic  Once,   R        07/15/22 0749             DVT prophylaxis: SCDs Start: 07/14/22 0009   Code Status:   Code Status: Full Code  Family Communication: none at bedside Disposition:    Dispo: The patient is from: home              Anticipated d/c is to: home              Anticipated d/c date is: possible in 48hrs,  f/u on cxr, wean oxygen, advance diet,  need gen surg clearance   Antimicrobials:   Anti-infectives (From admission, onward)  Start     Dose/Rate Route Frequency Ordered Stop   07/15/22 1700  metroNIDAZOLE (FLAGYL) IVPB 500 mg        500 mg 100 mL/hr over 60 Minutes Intravenous Every 12 hours 07/15/22 1556     07/15/22 1600  cefTRIAXone (ROCEPHIN) 2 g in sodium chloride 0.9 % 100 mL IVPB        2 g 200 mL/hr over 30 Minutes Intravenous Daily 07/15/22 1454     07/15/22 1100  ceFAZolin (ANCEF) IVPB 2g/100 mL premix       See Hyperspace for full Linked Orders Report.   2 g 200 mL/hr over 30 Minutes Intravenous On call to O.R. 07/15/22 1000 07/15/22 1200   07/15/22 1100  metroNIDAZOLE (FLAGYL) IVPB 500 mg  Status:  Discontinued       See Hyperspace for full Linked Orders Report.   500 mg 100 mL/hr over 60 Minutes Intravenous On call to O.R. 07/15/22 1000 07/15/22 1119            Objective: Vitals:   07/17/22 2022 07/18/22 0000 07/18/22 0546 07/18/22 0623  BP: 120/73 (!) 103/52 123/69   Pulse: 81 84 81   Resp: '17 19 17   '$ Temp: 98.7 F (37.1 C) 98.6 F (37 C) 98.9 F (37.2 C)   TempSrc: Oral Oral Oral   SpO2: 98% 99% 97% 96%  Weight:      Height:        Intake/Output Summary (Last 24 hours) at 07/18/2022 1058 Last data filed at 07/18/2022 0900 Gross per 24 hour  Intake 2096.29 ml  Output 0 ml  Net 2096.29 ml   Filed Weights   07/15/22 1112  Weight: 54.4 kg    Examination:  General exam: aaox3, pleasant , edentulous  Respiratory system: bilateral  lower lobe rhonchi, normal respiratory effort   Cardiovascular system:  RRR.  Gastrointestinal system: Abdomen post op changes., + bowel sounds, + wound vac Central nervous system: aaox3 Extremities:  no edema Skin: No rashes, lesions or ulcers Psychiatry: pleasant     Data Reviewed: I have personally reviewed  labs and visualized  imaging studies since the last encounter and formulate the plan        Scheduled Meds:  arformoterol  15 mcg Nebulization BID   bisacodyl  10 mg Rectal Daily   budesonide (PULMICORT) nebulizer solution  0.5 mg Nebulization BID   Chlorhexidine Gluconate Cloth  6 each Topical Daily   feeding supplement  1 Container Oral TID BM   lip balm   Topical BID   mouth rinse  15 mL Mouth Rinse 4 times per day   pantoprazole (PROTONIX) IV  40 mg Intravenous Daily   Continuous Infusions:  sodium chloride 15 mL/hr at 07/16/22 1600   cefTRIAXone (ROCEPHIN)  IV 2 g (07/18/22 0817)   methocarbamol (ROBAXIN) IV     metronidazole 500 mg (07/18/22 0522)   potassium PHOSPHATE IVPB (in mmol)       LOS: 5 days     Florencia Reasons, MD PhD FACP [facility]  Available via Epic secure chat 7am-7pm for nonurgent issues Please page for urgent issues To page the attending provider between 7A-7P or the covering provider during after hours 7P-7A, please log into the  web site [email or web address] and access using universal [place] password for that web site. If you do not have the password, please call the hospital operator.    07/18/2022, 10:58 AM

## 2022-07-19 ENCOUNTER — Encounter (HOSPITAL_COMMUNITY): Payer: Self-pay | Admitting: Surgery

## 2022-07-19 DIAGNOSIS — K56609 Unspecified intestinal obstruction, unspecified as to partial versus complete obstruction: Secondary | ICD-10-CM | POA: Diagnosis not present

## 2022-07-19 LAB — COMPREHENSIVE METABOLIC PANEL
ALT: 49 U/L — ABNORMAL HIGH (ref 0–44)
AST: 22 U/L (ref 15–41)
Albumin: 2.7 g/dL — ABNORMAL LOW (ref 3.5–5.0)
Alkaline Phosphatase: 40 U/L (ref 38–126)
Anion gap: 8 (ref 5–15)
BUN: 7 mg/dL — ABNORMAL LOW (ref 8–23)
CO2: 29 mmol/L (ref 22–32)
Calcium: 8.4 mg/dL — ABNORMAL LOW (ref 8.9–10.3)
Chloride: 101 mmol/L (ref 98–111)
Creatinine, Ser: 0.68 mg/dL (ref 0.44–1.00)
GFR, Estimated: >60 mL/min (ref >60)
Glucose, Bld: 102 mg/dL — ABNORMAL HIGH (ref 70–99)
Potassium: 3.5 mmol/L (ref 3.5–5.1)
Sodium: 138 mmol/L (ref 135–145)
Total Bilirubin: 0.6 mg/dL (ref 0.3–1.2)
Total Protein: 5.6 g/dL — ABNORMAL LOW (ref 6.5–8.1)

## 2022-07-19 LAB — CBC
HCT: 28.1 % — ABNORMAL LOW (ref 36.0–46.0)
Hemoglobin: 9.4 g/dL — ABNORMAL LOW (ref 12.0–15.0)
MCH: 30.8 pg (ref 26.0–34.0)
MCHC: 33.5 g/dL (ref 30.0–36.0)
MCV: 92.1 fL (ref 80.0–100.0)
Platelets: 148 10*3/uL — ABNORMAL LOW (ref 150–400)
RBC: 3.05 MIL/uL — ABNORMAL LOW (ref 3.87–5.11)
RDW: 13.1 % (ref 11.5–15.5)
WBC: 4.8 10*3/uL (ref 4.0–10.5)
nRBC: 0 % (ref 0.0–0.2)

## 2022-07-19 LAB — PHOSPHORUS: Phosphorus: 2.7 mg/dL (ref 2.5–4.6)

## 2022-07-19 LAB — SURGICAL PATHOLOGY

## 2022-07-19 MED ORDER — POTASSIUM CHLORIDE 20 MEQ PO PACK
40.0000 meq | PACK | Freq: Once | ORAL | Status: AC
  Administered 2022-07-19: 40 meq via ORAL
  Filled 2022-07-19: qty 2

## 2022-07-19 NOTE — Progress Notes (Signed)
PROGRESS NOTE    [name]  RDE:[id number] DOB: Mar 17, 1956 DOA: 07/13/2022 PCP: Idamae Schuller, MD     Brief Narrative:  H/o abdominal hysterectomy presents with ab pain, n/v, found to have sbo,has hypothermia, ng placed, admitted to progressive bed on warming blanket    Subjective:  POD #4, started on full liquids diet this am +bm, no n/v, reports some ab pain this am No fever, no cough, no sob, daughter at bedside    Assessment & Plan:  Principal Problem:   Closed loop SBO with necrosis s/p SB resection 07/15/2022 Active Problems:   GERD   Sinus bradycardia   HLD (hyperlipidemia)   SBO (small bowel obstruction) (HCC)   Hypothermia   Prolonged QT interval   Sbo S/p Exploratory laparotomy, lysis of adhesions, small bowel resection with primary anastomosis , post op  was intubated  and on pressor briefly.  + wound vac, On rocephin/flagyl Started to have bm, diet advancement per gen surg Appreciate gen surg    Cough  Blood culture no growth  Lung exam bilateral lower lobe rhonchi chest X-ray possible atelectasis  f/u on sputum culture Already on abx, encourage incentive spirometer, mobilize   Hypokalemia/hypomagnesemia/hypophosphatemia Replaced and normalized    Hypothermia, POA, resolved  Blood culture no growth Cortisol level elevated   Sinus bradycardia, POA,  resolved  Tsh 0.6 Resolved, echocardiogram no acute findings,  monitor   Qtc 490 Keep K>4, mag >2 Resolved on Repeat EKG  Discontinue tele  Impaired fasting blood glucose Denies h/o DM2, possible due to stress, improved  A1c 6%   UDS+ cocaine, THC  Cigarette smoking ,smoking cessation education provided, she declined nicotine patch   .   I have Reviewed nursing notes, Vitals, pain scores, I/o's, Lab results and  imaging results since pt's last encounter, details please see discussion above  I ordered the following labs:  Unresulted Labs (From admission, onward)     Start      Ordered   07/18/22 0500  CBC  Daily,   R     Question:  Specimen collection method  Answer:  Lab=Lab collect   07/17/22 0824   07/18/22 0500  Comprehensive metabolic panel  Daily,   R     Question:  Specimen collection method  Answer:  Lab=Lab collect   07/17/22 0824   07/18/22 0500  Phosphorus  Daily,   R     Question:  Specimen collection method  Answer:  Lab=Lab collect   07/17/22 0824   07/15/22 0749  Urinalysis, Routine w reflex microscopic  Once,   R        07/15/22 0749             DVT prophylaxis: SCDs Start: 07/14/22 0009   Code Status:   Code Status: Full Code  Family Communication: daughter at bedside Disposition:    Dispo: The patient is from: home              Anticipated d/c is to: home              Anticipated d/c date is: f/u on sputum culture, advance diet,  need gen surg clearance   Antimicrobials:   Anti-infectives (From admission, onward)    Start     Dose/Rate Route Frequency Ordered Stop   07/15/22 1700  metroNIDAZOLE (FLAGYL) IVPB 500 mg        500 mg 100 mL/hr over 60 Minutes Intravenous Every 12 hours 07/15/22 1556     07/15/22 1600  cefTRIAXone (ROCEPHIN) 2 g in sodium chloride 0.9 % 100 mL IVPB        2 g 200 mL/hr over 30 Minutes Intravenous Daily 07/15/22 1454     07/15/22 1100  ceFAZolin (ANCEF) IVPB 2g/100 mL premix       See Hyperspace for full Linked Orders Report.   2 g 200 mL/hr over 30 Minutes Intravenous On call to O.R. 07/15/22 1000 07/15/22 1200   07/15/22 1100  metroNIDAZOLE (FLAGYL) IVPB 500 mg  Status:  Discontinued       See Hyperspace for full Linked Orders Report.   500 mg 100 mL/hr over 60 Minutes Intravenous On call to O.R. 07/15/22 1000 07/15/22 1119           Objective: Vitals:   07/18/22 2101 07/19/22 0457 07/19/22 0946 07/19/22 1217  BP: 126/74 116/67  120/76  Pulse: 73 73  73  Resp: '20 18  18  '$ Temp: 99.3 F (37.4 C) 98.6 F (37 C)  98.5 F (36.9 C)  TempSrc: Oral Oral  Oral  SpO2: 100% 97% 92%  94%  Weight:      Height:        Intake/Output Summary (Last 24 hours) at 07/19/2022 2048 Last data filed at 07/19/2022 1536 Gross per 24 hour  Intake 560 ml  Output 450 ml  Net 110 ml   Filed Weights   07/15/22 1112  Weight: 54.4 kg    Examination:  General exam: aaox3, pleasant , edentulous  Respiratory system: bilateral  lower lobe rhonchi, normal respiratory effort   Cardiovascular system:  RRR.  Gastrointestinal system: Abdomen post op changes., + bowel sounds, + wound vac Central nervous system: aaox3 Extremities:  no edema Skin: No rashes, lesions or ulcers Psychiatry: pleasant     Data Reviewed: I have personally reviewed  labs and visualized  imaging studies since the last encounter and formulate the plan        Scheduled Meds:  arformoterol  15 mcg Nebulization BID   bisacodyl  10 mg Rectal Daily   budesonide (PULMICORT) nebulizer solution  0.5 mg Nebulization BID   Chlorhexidine Gluconate Cloth  6 each Topical Daily   feeding supplement  1 Container Oral TID BM   lip balm   Topical BID   mouth rinse  15 mL Mouth Rinse 4 times per day   pantoprazole (PROTONIX) IV  40 mg Intravenous Daily   Continuous Infusions:  sodium chloride 10 mL/hr at 07/19/22 1536   cefTRIAXone (ROCEPHIN)  IV Stopped (07/19/22 1038)   methocarbamol (ROBAXIN) IV 1,000 mg (07/19/22 2017)   metronidazole 500 mg (07/19/22 1644)     LOS: 6 days     Florencia Reasons, MD PhD FACP [facility]  Available via Epic secure chat 7am-7pm for nonurgent issues Please page for urgent issues To page the attending provider between 7A-7P or the covering provider during after hours 7P-7A, please log into the web site [email or web address] and access using universal [place] password for that web site. If you do not have the password, please call the hospital operator.    07/19/2022, 8:48 PM

## 2022-07-19 NOTE — Progress Notes (Signed)
Physical Therapy Treatment Patient Details Name: [name] MRN: [id number] DOB: 09/10/1955 Today's Date: 07/19/2022   History of Present Illness Pt admitted from home 07/13/22 with abdominal pain 2* SBO and not s/p Exploratory lap, lysis of adhesions, and small bowel resection with primary anastomosis    PT Comments    Pt is progressing well with mobility, she tolerated increased ambulation distance.    Recommendations for follow up therapy are one component of a multi-disciplinary discharge planning process, led by the attending physician.  Recommendations may be updated based on patient status, additional functional criteria and insurance authorization.  Follow Up Recommendations  No PT follow up     Assistance Recommended at Discharge PRN  Patient can return home with the following A little help with bathing/dressing/bathroom;Assist for transportation;Assistance with cooking/housework;Help with stairs or ramp for entrance   Equipment Recommendations  None recommended by PT    Recommendations for Other Services       Precautions / Restrictions Precautions Precautions: Fall;Other (comment) Precaution Comments: s/p abdominal surgery Restrictions Weight Bearing Restrictions: No     Mobility  Bed Mobility   Bed Mobility: Rolling, Sidelying to Sit Rolling: Supervision Sidelying to sit: Supervision       General bed mobility comments: cues for log roll technique    Transfers Overall transfer level: Needs assistance Equipment used: None Transfers: Sit to/from Stand Sit to Stand: Supervision           General transfer comment: VCs hand placement, assist to manage lines only    Ambulation/Gait Ambulation/Gait assistance: Supervision Gait Distance (Feet): 220 Feet Assistive device: IV Pole Gait Pattern/deviations: Step-through pattern, Decreased stride length Gait velocity: decr     General Gait Details: steady, no loss of balance   Stairs              Wheelchair Mobility    Modified Rankin (Stroke Patients Only)       Balance     Sitting balance-Leahy Scale: Good     Standing balance support: Single extremity supported, During functional activity Standing balance-Leahy Scale: Fair                              Cognition Arousal/Alertness: Awake/alert Behavior During Therapy: WFL for tasks assessed/performed Overall Cognitive Status: Within Functional Limits for tasks assessed                                          Exercises      General Comments        Pertinent Vitals/Pain Pain Assessment Pain Score: 2  Pain Location: abdomen Pain Descriptors / Indicators: Pressure Pain Intervention(s): Limited activity within patient's tolerance, Monitored during session, Repositioned    Home Living                          Prior Function            PT Goals (current goals can now be found in the care plan section) Acute Rehab PT Goals Patient Stated Goal: Regain IND PT Goal Formulation: With patient Time For Goal Achievement: 07/31/22 Potential to Achieve Goals: Good    Frequency    Min 3X/week      PT Plan      Co-evaluation  AM-PAC PT "6 Clicks" Mobility   Outcome Measure  Help needed turning from your back to your side while in a flat bed without using bedrails?: None Help needed moving from lying on your back to sitting on the side of a flat bed without using bedrails?: None Help needed moving to and from a bed to a chair (including a wheelchair)?: None Help needed standing up from a chair using your arms (e.g., wheelchair or bedside chair)?: None Help needed to walk in hospital room?: A Little Help needed climbing 3-5 steps with a railing? : A Little 6 Click Score: 22    End of Session Equipment Utilized During Treatment: Gait belt Activity Tolerance: Patient tolerated treatment well Patient left: in chair;with call bell/phone  within reach Nurse Communication: Mobility status PT Visit Diagnosis: Unsteadiness on feet (R26.81);Muscle weakness (generalized) (M62.81);Difficulty in walking, not elsewhere classified (R26.2)     Time: 7356-7014 PT Time Calculation (min) (ACUTE ONLY): 20 min  Charges:  $Gait Training: 8-22 mins                     Blondell Reveal Kistler PT 07/19/2022  Acute Rehabilitation Services  Office [phone]

## 2022-07-19 NOTE — Care Management Important Message (Signed)
Important Message  Patient Details IM Letter given. Name: [name] MRN: [id number] Date of Birth: [birth year]   Medicare Important Message Given:  Yes     Kerin Salen 07/19/2022, 1:27 PM

## 2022-07-19 NOTE — TOC Progression Note (Signed)
Transition of Care [facility]) - Progression Note    Patient Details  Name: [name] MRN: [id number] Date of Birth: 04-08-1956  Transition of [facility] CM/SW Contact  Olisa Quesnel, Juliann Pulse, RN Phone Number: 07/19/2022, 4:08 PM  Clinical Narrative:Wound vac prescription on shadow chart for surgery to sign. Will need wound measurements for wound vac.Will check on [place].       Expected Discharge Plan: [place] Barriers to Discharge: Continued Medical Work up  Expected Discharge Plan and Services                                               Social Determinants of Health (SDOH) Interventions [place]: No Food Insecurity (07/14/2022)  Housing: Low Risk  (07/14/2022)  Transportation Needs: No Transportation Needs (07/14/2022)  Utilities: Not At Risk (07/14/2022)  Depression (PHQ2-9): High Risk (04/12/2022)  Tobacco Use: High Risk (07/19/2022)    Readmission Risk Interventions     No data to display

## 2022-07-19 NOTE — Progress Notes (Signed)
4 Days Post-Op   Subjective/Chief Complaint: Doing well with clears. Had a BM today. No nausea or emesis. No worsening abdominal pain   Objective: Vital signs in last 24 hours: Temp:  [98.6 F (37 C)-99.5 F (37.5 C)] 98.6 F (37 C) (12/26 0457) Pulse Rate:  [72-73] 73 (12/26 0457) Resp:  [18-20] 18 (12/26 0457) BP: (116-126)/(67-80) 116/67 (12/26 0457) SpO2:  [92 %-100 %] 92 % (12/26 0946) Last BM Date : 07/18/22  Intake/Output from previous day: 12/25 0701 - 12/26 0700 In: 699.9 [P.O.:480; IV Piggyback:219.9] Out: 450 [Urine:450] Intake/Output this shift: No intake/output data recorded.  A&Ox3 Unlabored respirations Abdomen soft, nondistended, minimal/appropriate tenderness. Vac in place with SS drainage  Lab Results:  Recent Labs    07/18/22 0524 07/19/22 0446  WBC 6.0 4.8  HGB 9.4* 9.4*  HCT 28.7* 28.1*  PLT 154 148*    BMET Recent Labs    07/18/22 0524 07/19/22 0446  NA 137 138  K 3.9 3.5  CL 103 101  CO2 27 29  GLUCOSE 96 102*  BUN 12 7*  CREATININE 0.77 0.68  CALCIUM 8.2* 8.4*    PT/INR No results for input(s): "LABPROT", "INR" in the last 72 hours. ABG No results for input(s): "PHART", "HCO3" in the last 72 hours.  Invalid input(s): "PCO2", "PO2"   Studies/Results: DG CHEST PORT 1 VIEW  Result Date: 07/18/2022 CLINICAL DATA:  10031 Cough 10031 EXAM: PORTABLE CHEST 1 VIEW COMPARISON:  July 15, 2022 FINDINGS: The cardiomediastinal silhouette is unchanged in contour.Interval extubation. Small RIGHT pleural effusion. No pneumothorax. Increased RIGHT greater than LEFT bibasilar platelike opacities. IMPRESSION: 1. Increased RIGHT greater than LEFT bibasilar platelike opacities, favored atelectasis. 2. Small RIGHT pleural effusion. Electronically Signed   By: Valentino Saxon M.D.   On: 07/18/2022 11:19    Anti-infectives: Anti-infectives (From admission, onward)    Start     Dose/Rate Route Frequency Ordered Stop   07/15/22 1700   metroNIDAZOLE (FLAGYL) IVPB 500 mg        500 mg 100 mL/hr over 60 Minutes Intravenous Every 12 hours 07/15/22 1556     07/15/22 1600  cefTRIAXone (ROCEPHIN) 2 g in sodium chloride 0.9 % 100 mL IVPB        2 g 200 mL/hr over 30 Minutes Intravenous Daily 07/15/22 1454     07/15/22 1100  ceFAZolin (ANCEF) IVPB 2g/100 mL premix       See Hyperspace for full Linked Orders Report.   2 g 200 mL/hr over 30 Minutes Intravenous On call to O.R. 07/15/22 1000 07/15/22 1200   07/15/22 1100  metroNIDAZOLE (FLAGYL) IVPB 500 mg  Status:  Discontinued       See Hyperspace for full Linked Orders Report.   500 mg 100 mL/hr over 60 Minutes Intravenous On call to O.R. 07/15/22 1000 07/15/22 1119       Assessment/Plan:  Closed loop SBO - CT on admission with question of internal hernia - WBC and lactic acid w/ repeat normal on admit - history of abdominal hysterectomy and likely open cholecystectomy POD 4 S/p Exploratory laparotomy, lysis of adhesions, small bowel resection with primary anastomosis on 12/22 Dr. Georgette Dover  - appears to be progressing well - tolerating clears. Advance to fulls -Wound vac changes MWF - will consult TOC to see if option to discharge with it vs transition to wet to dry at discharge   FEN: fulls ID: rocephin/flagyl 12/22--> VTE: okay for chemical ppx from surgical perspective   Per primary AMS/hypothermia/bradycardia on  admit  GERD HLD UDS positive for cocaine     LOS: 6 days    Winferd Humphrey, [facility] 07/19/2022, 9:51 AM Please see Amion for pager number during day hours 7:00am-4:30pm

## 2022-07-20 LAB — CBC
HCT: 27.9 % — ABNORMAL LOW (ref 36.0–46.0)
Hemoglobin: 9.3 g/dL — ABNORMAL LOW (ref 12.0–15.0)
MCH: 30.7 pg (ref 26.0–34.0)
MCHC: 33.3 g/dL (ref 30.0–36.0)
MCV: 92.1 fL (ref 80.0–100.0)
Platelets: 160 10*3/uL (ref 150–400)
RBC: 3.03 MIL/uL — ABNORMAL LOW (ref 3.87–5.11)
RDW: 13.2 % (ref 11.5–15.5)
WBC: 4.4 10*3/uL (ref 4.0–10.5)
nRBC: 0 % (ref 0.0–0.2)

## 2022-07-20 LAB — COMPREHENSIVE METABOLIC PANEL
ALT: 32 U/L (ref 0–44)
AST: 12 U/L — ABNORMAL LOW (ref 15–41)
Albumin: 2.4 g/dL — ABNORMAL LOW (ref 3.5–5.0)
Alkaline Phosphatase: 32 U/L — ABNORMAL LOW (ref 38–126)
Anion gap: 7 (ref 5–15)
BUN: 6 mg/dL — ABNORMAL LOW (ref 8–23)
CO2: 29 mmol/L (ref 22–32)
Calcium: 8.3 mg/dL — ABNORMAL LOW (ref 8.9–10.3)
Chloride: 102 mmol/L (ref 98–111)
Creatinine, Ser: 0.67 mg/dL (ref 0.44–1.00)
GFR, Estimated: >60 mL/min (ref >60)
Glucose, Bld: 119 mg/dL — ABNORMAL HIGH (ref 70–99)
Potassium: 3.6 mmol/L (ref 3.5–5.1)
Sodium: 138 mmol/L (ref 135–145)
Total Bilirubin: 0.6 mg/dL (ref 0.3–1.2)
Total Protein: 5.2 g/dL — ABNORMAL LOW (ref 6.5–8.1)

## 2022-07-20 LAB — CULTURE, BLOOD (ROUTINE X 2)
Culture: NO GROWTH
Culture: NO GROWTH

## 2022-07-20 LAB — CULTURE, RESPIRATORY W GRAM STAIN: Culture: NORMAL

## 2022-07-20 LAB — PHOSPHORUS: Phosphorus: 3.2 mg/dL (ref 2.5–4.6)

## 2022-07-20 MED ORDER — ORAL CARE MOUTH RINSE: 15.0000 mL | OROMUCOSAL | Status: DC | PRN

## 2022-07-20 NOTE — TOC Progression Note (Addendum)
Transition of Care [facility]) - Progression Note    Patient Details  Name: [name] MRN: [id number] Date of Birth: 1956-05-03  Transition of Care [facility]) CM/SW Contact  Oliviya Gilkison, Juliann Pulse, RN Phone Number: 07/20/2022, 11:44 AM  Clinical Narrative:  -unable to get a [place] agency to accept for wound vac changes-patient is aware, & she has daughters that are are CNA's they already know how to do vac changes.;awaiting wound measurements,awaiting form to be signed so [employer] can complete process for insurance auth, & delivery of wound vac to rm prior d/c. -3:46p [facility] for HHRN-wound care vac changes.still awaiting wound measurements. Emailed wound vac form to [employer].Unable to d/c until wound measurements are placed. -4p-emailed w/confirmation to [facility] rep [place] without wound measurements.MD/supv aware.    Expected Discharge Plan: Home/Self Care Barriers to Discharge: Continued Medical Work up  Expected Discharge Plan and Services   Discharge Planning Services: CM Consult Post Acute Care Choice:  (No [place] agency to accept) Living arrangements for the past 2 months: Single Family Home                 DME Arranged: Vac DME Agency: [employer] Date DME Agency Contacted: 07/20/22 Time DME Agency Contacted: [id number] Representative spoke with at DME Agency: [place]:  (No [place] agency for [facility] to accept)           Social Determinants of Health (SDOH) Interventions [place]: No Food Insecurity (07/14/2022)  Housing: Low Risk  (07/14/2022)  Transportation Needs: No Transportation Needs (07/14/2022)  Utilities: Not At Risk (07/14/2022)  Depression (PHQ2-9): High Risk (04/12/2022)  Tobacco Use: High Risk (07/19/2022)    Readmission Risk Interventions     No data to display

## 2022-07-20 NOTE — Progress Notes (Signed)
5 Days Post-Op   Subjective/Chief Complaint: Tolerated  full liquids Having BM's Pain controlled VAC changed yesterday per patient    Objective: Vital signs in last 24 hours: Temp:  [97.6 F (36.4 C)-98.5 F (36.9 C)] 98.5 F (36.9 C) (12/27 0508) Pulse Rate:  [73-77] 77 (12/27 0508) Resp:  [18-20] 20 (12/27 0508) BP: (120-133)/(72-80) 124/72 (12/27 0508) SpO2:  [92 %-100 %] 95 % (12/27 0508) Last BM Date : 07/18/22  Intake/Output from previous day: 12/26 0701 - 12/27 0700 In: 1264 [P.O.:720; I.V.:134; IV Piggyback:410] Out: -  Intake/Output this shift: No intake/output data recorded.  Exam: Awake and alert Looks comfortable Abdomen soft, non-distended, minimally tender, VAC in place  Lab Results:  Recent Labs    07/19/22 0446 07/20/22 0652  WBC 4.8 4.4  HGB 9.4* 9.3*  HCT 28.1* 27.9*  PLT 148* 160   BMET Recent Labs    07/18/22 0524 07/19/22 0446  NA 137 138  K 3.9 3.5  CL 103 101  CO2 27 29  GLUCOSE 96 102*  BUN 12 7*  CREATININE 0.77 0.68  CALCIUM 8.2* 8.4*   PT/INR No results for input(s): "LABPROT", "INR" in the last 72 hours. ABG No results for input(s): "PHART", "HCO3" in the last 72 hours.  Invalid input(s): "PCO2", "PO2"  Studies/Results: DG CHEST PORT 1 VIEW  Result Date: 07/18/2022 CLINICAL DATA:  10031 Cough 10031 EXAM: PORTABLE CHEST 1 VIEW COMPARISON:  July 15, 2022 FINDINGS: The cardiomediastinal silhouette is unchanged in contour.Interval extubation. Small RIGHT pleural effusion. No pneumothorax. Increased RIGHT greater than LEFT bibasilar platelike opacities. IMPRESSION: 1. Increased RIGHT greater than LEFT bibasilar platelike opacities, favored atelectasis. 2. Small RIGHT pleural effusion. Electronically Signed   By: Valentino Saxon M.D.   On: 07/18/2022 11:19    Anti-infectives: Anti-infectives (From admission, onward)    Start     Dose/Rate Route Frequency Ordered Stop   07/15/22 1700  metroNIDAZOLE (FLAGYL) IVPB  500 mg        500 mg 100 mL/hr over 60 Minutes Intravenous Every 12 hours 07/15/22 1556     07/15/22 1600  cefTRIAXone (ROCEPHIN) 2 g in sodium chloride 0.9 % 100 mL IVPB        2 g 200 mL/hr over 30 Minutes Intravenous Daily 07/15/22 1454     07/15/22 1100  ceFAZolin (ANCEF) IVPB 2g/100 mL premix       See Hyperspace for full Linked Orders Report.   2 g 200 mL/hr over 30 Minutes Intravenous On call to O.R. 07/15/22 1000 07/15/22 1200   07/15/22 1100  metroNIDAZOLE (FLAGYL) IVPB 500 mg  Status:  Discontinued       See Hyperspace for full Linked Orders Report.   500 mg 100 mL/hr over 60 Minutes Intravenous On call to O.R. 07/15/22 1000 07/15/22 1119       Assessment/Plan: Closed loop SBO - CT on admission with question of internal hernia - WBC and lactic acid w/ repeat normal on admit - history of abdominal hysterectomy and likely open cholecystectomy POD 4 S/p Exploratory laparotomy, lysis of adhesions, small bowel resection with primary anastomosis on 12/22 Dr. Georgette Dover   Doing well Advancing to regular diet OK for discharge home from a surgical standpoint with home health wound VAC.  Coralie Keens MD 07/20/2022

## 2022-07-20 NOTE — Progress Notes (Signed)
Consultation Progress Note   Patient: [name]:[id number] DOB: 07/02/1956 DOA: 07/13/2022 DOS: the patient was seen and examined on 07/20/2022 Primary service: DibiaManfred Shirts, MD  Brief hospital course: H/o abdominal hysterectomy presents with ab pain, n/v, found to have sbo,has hypothermia, ng placed, admitted to progressive bed on warming blanket   Assessment and Plan:  Principal Problem:   Closed loop SBO with necrosis s/p SB resection 07/15/2022 Active Problems:   GERD   Sinus bradycardia   HLD (hyperlipidemia)   SBO (small bowel obstruction) (HCC)   Hypothermia   Prolonged QT interval     Sbo S/p Exploratory laparotomy, lysis of adhesions, small bowel resection with primary anastomosis , post op  was intubated  and on pressor briefly.  + wound vac, On rocephin/flagyl Started to have bm, diet advancement per gen surg Appreciate gen surg     Cough  Blood culture no growth  Lung exam bilateral lower lobe rhonchi chest X-ray possible atelectasis  f/u on sputum culture Already on abx, encourage incentive spirometer, mobilize    Hypokalemia/hypomagnesemia/hypophosphatemia Replaced and normalized      Hypothermia, POA, resolved  Blood culture no growth Cortisol level elevated    Sinus bradycardia, POA,  resolved  Tsh 0.6 Resolved, echocardiogram no acute findings,  monitor    Qtc 490 Keep K>4, mag >2 Resolved on Repeat EKG  Discontinue tele   Impaired fasting blood glucose Denies h/o DM2, possible due to stress, improved  A1c 6%     UDS+ cocaine, THC   Cigarette smoking ,smoking cessation education provided, she declined nicotine patch    I have Reviewed nursing notes, Vitals, pain scores, I/o's, Lab results and  imaging results since pt's last encounter, details please see discussion above        TRH will continue to follow the patient.  Subjective: No complaints, she wants to try solid food this morning. She had BM this  morning.  Physical Exam: Vitals:   07/19/22 1217 07/20/22 0052 07/20/22 0508 07/20/22 0826  BP: 120/76 133/80 124/72   Pulse: 73 73 77   Resp: '18 20 20   '$ Temp: 98.5 F (36.9 C) 97.6 F (36.4 C) 98.5 F (36.9 C)   TempSrc: Oral Oral Oral   SpO2: 94% 100% 95% 95%  Weight:      Height:      General exam: aaox3, pleasant , edentulous  Respiratory system: bilateral  lower lobe rhonchi, normal respiratory effort   Cardiovascular system:  RRR.  Gastrointestinal system: Abdomen post op changes., + bowel sounds, + wound vac Central nervous system: aaox3 Extremities:  no edema Skin: No rashes, lesions or ulcers Psychiatry: pleasant   Data Reviewed:  There are no new results to review at this time.  Family Communication:   Time spent: 15 minutes.  Author: Cristela Felt, MD 07/20/2022 9:50 AM  For on call review [email or web address].

## 2022-07-20 NOTE — Progress Notes (Signed)
Mobility Specialist - Progress Note     07/20/22 1206  Oxygen Therapy  O2 Device Nasal Cannula  O2 Flow Rate (L/min) 2 L/min  Patient Activity (if Appropriate) Ambulating  Mobility  Activity Ambulated independently in hallway  Level of Assistance Independent after set-up  Assistive Device None  Distance Ambulated (ft) 500 ft  Range of Motion/Exercises Active  Activity Response Tolerated well  Mobility Referral Yes  $Mobility charge 1 Mobility   Pt was found in bed and agreeable to ambulate. Had no complaints during session and at EOS returned to bed with all necessities in reach.  Ferd Hibbs Mobility Specialist

## 2022-07-21 LAB — CBC
HCT: 27.3 % — ABNORMAL LOW (ref 36.0–46.0)
Hemoglobin: 9.1 g/dL — ABNORMAL LOW (ref 12.0–15.0)
MCH: 30.8 pg (ref 26.0–34.0)
MCHC: 33.3 g/dL (ref 30.0–36.0)
MCV: 92.5 fL (ref 80.0–100.0)
Platelets: 171 10*3/uL (ref 150–400)
RBC: 2.95 MIL/uL — ABNORMAL LOW (ref 3.87–5.11)
RDW: 13.3 % (ref 11.5–15.5)
WBC: 4.6 10*3/uL (ref 4.0–10.5)
nRBC: 0 % (ref 0.0–0.2)

## 2022-07-21 LAB — BASIC METABOLIC PANEL
Anion gap: 9 (ref 5–15)
BUN: 6 mg/dL — ABNORMAL LOW (ref 8–23)
CO2: 29 mmol/L (ref 22–32)
Calcium: 8.4 mg/dL — ABNORMAL LOW (ref 8.9–10.3)
Chloride: 102 mmol/L (ref 98–111)
Creatinine, Ser: 0.7 mg/dL (ref 0.44–1.00)
GFR, Estimated: >60 mL/min (ref >60)
Glucose, Bld: 110 mg/dL — ABNORMAL HIGH (ref 70–99)
Potassium: 3.3 mmol/L — ABNORMAL LOW (ref 3.5–5.1)
Sodium: 140 mmol/L (ref 135–145)

## 2022-07-21 LAB — GLUCOSE, CAPILLARY: Glucose-Capillary: 132 mg/dL — ABNORMAL HIGH (ref 70–99)

## 2022-07-21 MED ORDER — CEPHALEXIN 500 MG PO CAPS: 500.0000 mg | ORAL_CAPSULE | Freq: Four times a day (QID) | ORAL | 0 refills | Status: AC

## 2022-07-21 MED ORDER — VITAMIN D 25 MCG (1000 UNIT) PO TABS: 2000.0000 [IU] | ORAL_TABLET | Freq: Every day | ORAL | 2 refills | Status: AC

## 2022-07-21 MED ORDER — OXYCODONE HCL 5 MG PO TABS: 5.0000 mg | ORAL_TABLET | Freq: Four times a day (QID) | ORAL | 0 refills | Status: AC | PRN

## 2022-07-21 MED ORDER — METRONIDAZOLE 500 MG PO TABS: 500.0000 mg | ORAL_TABLET | Freq: Three times a day (TID) | ORAL | 0 refills | Status: AC

## 2022-07-21 NOTE — Progress Notes (Signed)
Wound measurements - 2.5cm wide, 17cm long and 3cm deep

## 2022-07-21 NOTE — Progress Notes (Signed)
6 Days Post-Op   Subjective/Chief Complaint: Tolerating a diet Having BM's Pain controlled    Objective: Vital signs in last 24 hours: Temp:  [98.3 F (36.8 C)-98.8 F (37.1 C)] 98.8 F (37.1 C) (12/28 0422) Pulse Rate:  [72-81] 77 (12/28 0422) Resp:  [16-20] 20 (12/28 0422) BP: (112-120)/(69-76) 112/69 (12/28 0422) SpO2:  [91 %-96 %] 96 % (12/28 0422) Last BM Date : 07/18/22  Intake/Output from previous day: 12/27 0701 - 12/28 0700 In: 0.5 [IV Piggyback:0.5] Out: 0  Intake/Output this shift: No intake/output data recorded.  Exam: Awake and alert Looks comfortable Abdomen soft, non-distended, minimally tender, VAC in place  Lab Results:  Recent Labs    07/20/22 0652 07/21/22 0550  WBC 4.4 4.6  HGB 9.3* 9.1*  HCT 27.9* 27.3*  PLT 160 171    BMET Recent Labs    07/20/22 0652 07/21/22 0550  NA 138 140  K 3.6 3.3*  CL 102 102  CO2 29 29  GLUCOSE 119* 110*  BUN 6* 6*  CREATININE 0.67 0.70  CALCIUM 8.3* 8.4*    PT/INR No results for input(s): "LABPROT", "INR" in the last 72 hours. ABG No results for input(s): "PHART", "HCO3" in the last 72 hours.  Invalid input(s): "PCO2", "PO2"  Studies/Results: No results found.  Anti-infectives: Anti-infectives (From admission, onward)    Start     Dose/Rate Route Frequency Ordered Stop   07/15/22 1700  metroNIDAZOLE (FLAGYL) IVPB 500 mg        500 mg 100 mL/hr over 60 Minutes Intravenous Every 12 hours 07/15/22 1556     07/15/22 1600  cefTRIAXone (ROCEPHIN) 2 g in sodium chloride 0.9 % 100 mL IVPB        2 g 200 mL/hr over 30 Minutes Intravenous Daily 07/15/22 1454     07/15/22 1100  ceFAZolin (ANCEF) IVPB 2g/100 mL premix       See Hyperspace for full Linked Orders Report.   2 g 200 mL/hr over 30 Minutes Intravenous On call to O.R. 07/15/22 1000 07/15/22 1200   07/15/22 1100  metroNIDAZOLE (FLAGYL) IVPB 500 mg  Status:  Discontinued       See Hyperspace for full Linked Orders Report.   500 mg 100  mL/hr over 60 Minutes Intravenous On call to O.R. 07/15/22 1000 07/15/22 1119       Assessment/Plan: Closed loop SBO - CT on admission with question of internal hernia - WBC and lactic acid w/ repeat normal on admit - history of abdominal hysterectomy and likely open cholecystectomy POD 5 S/p Exploratory laparotomy, lysis of adhesions, small bowel resection with primary anastomosis on 12/22 Dr. Georgette Dover   Doing well OK for discharge home from a surgical standpoint with home health and wound VAC.  Rosario Adie MD [id number]

## 2022-07-21 NOTE — Discharge Summary (Addendum)
Physician Discharge Summary   Patient: [name] MRN: [id number] DOB: 10-10-1955  Admit date:     07/13/2022  Discharge date: 07/21/22  Discharge Physician: Manfred Shirts Grenda Lora   PCP: Idamae Schuller, MD   Recommendations at discharge:    Follow up with Gen Surgery and PCP  Discharge Diagnoses: Principal Problem:   Closed loop SBO with necrosis s/p SB resection 07/15/2022 Active Problems:   GERD   Sinus bradycardia   HLD (hyperlipidemia)   SBO (small bowel obstruction) (HCC)   Hypothermia   Prolonged QT interval  Resolved Problems:   * No resolved hospital problems. [facility] Course:   Assessment and Plan: sbo S/p Exploratory laparotomy, lysis of adhesions, small bowel resection with primary anastomosis , post op  was intubated  and on pressor briefly.  + wound vac, Managed with rocephin/flagyl Started to have bm, diet advanced per gen surg DC with wound vac, switched to oral Keflex and flagyl Follow up with PCP and gen surgery   Cough  Blood culture no growth  Lung exam bilateral lower lobe rhonchi chest X-ray possible atelectasis  negative sputum culture encourage incentive spirometer, mobilize    Hypokalemia/hypomagnesemia/hypophosphatemia Replaced and normalized      Hypothermia, POA, resolved  Blood culture no growth Cortisol level elevated    Sinus bradycardia, POA,  resolved  Tsh 0.6 Resolved, echocardiogram no acute findings,  monitor    Qtc 490 Keep K>4, mag >2 Resolved on Repeat EKG  Discontinue tele   Impaired fasting blood glucose Denies h/o DM2, possible due to stress, improved  A1c 6%     UDS+ cocaine, THC   Cigarette smoking ,smoking cessation education provided, she declined nicotine patch     AKI -Improved, likely due to hypovolemia in setting of nausea and vomiting.      Consultants: Surgery Procedures performed:   Disposition: Home health Diet recommendation:  Regular diet DISCHARGE MEDICATION: Allergies as of  07/21/2022       Reactions   Aspirin Nausea And Vomiting   Other Reaction(s): Unknown        Medication List     TAKE these medications    acetaminophen 500 MG tablet Commonly known as: TYLENOL Take 1 tablet (500 mg total) by mouth every 6 (six) hours as needed.   cephALEXin 500 MG capsule Commonly known as: KEFLEX Take 1 capsule (500 mg total) by mouth 4 (four) times daily for 7 days.   cetirizine 10 MG tablet Commonly known as: ZyrTEC Allergy Take 1 tablet (10 mg total) by mouth daily.   cholecalciferol 25 MCG (1000 UNIT) tablet Commonly known as: VITAMIN D3 Take 2 tablets (2,000 Units total) by mouth daily.   FLUoxetine 40 MG capsule Commonly known as: PROZAC Take 80 mg by mouth daily.   gabapentin 300 MG capsule Commonly known as: NEURONTIN Take 2 capsules (600 mg total) by mouth 3 (three) times daily. TAKE 2 CAPSULES(600 MG) BY MOUTH THREE TIMES DAILY Strength: 300 mg   metroNIDAZOLE 500 MG tablet Commonly known as: Flagyl Take 1 tablet (500 mg total) by mouth 3 (three) times daily for 7 days.   multivitamin with minerals Tabs tablet Take 1 tablet by mouth daily.   omeprazole 40 MG capsule Commonly known as: PRILOSEC Take 40 mg by mouth daily.   omeprazole 40 MG capsule Commonly known as: PRILOSEC Take 1 capsule (40 mg total) by mouth daily.   oxyCODONE 5 MG immediate release tablet Commonly known as: Oxy IR/ROXICODONE Take 1 tablet (5  mg total) by mouth every 6 (six) hours as needed for up to 7 days for severe pain or breakthrough pain.   rosuvastatin 20 MG tablet Commonly known as: Crestor Take 1 tablet (20 mg total) by mouth daily.        Follow-up [place], [facility] Follow up.   Why: [place] information: [place] [address] [phone]                Discharge Exam: Danley Danker Weights   07/15/22 1112  Weight: 54.4 kg     Condition at discharge:  good  The results of significant diagnostics from this hospitalization (including imaging, microbiology, ancillary and laboratory) are listed below for reference.   Imaging Studies: DG CHEST PORT 1 VIEW  Result Date: 07/18/2022 CLINICAL DATA:  10031 Cough 10031 EXAM: PORTABLE CHEST 1 VIEW COMPARISON:  July 15, 2022 FINDINGS: The cardiomediastinal silhouette is unchanged in contour.Interval extubation. Small RIGHT pleural effusion. No pneumothorax. Increased RIGHT greater than LEFT bibasilar platelike opacities. IMPRESSION: 1. Increased RIGHT greater than LEFT bibasilar platelike opacities, favored atelectasis. 2. Small RIGHT pleural effusion. Electronically Signed   By: Valentino Saxon M.D.   On: 07/18/2022 11:19   DG Abd 1 View  Result Date: 07/15/2022 CLINICAL DATA:  NG tube placement EXAM: ABDOMEN - 1 VIEW COMPARISON:  07/15/2022, 4:48 a.m. FINDINGS: Interval advancement of esophagogastric tube, tip and side port well below the diaphragm. Similar appearance of gas-filled, mildly distended loops of small bowel throughout the mid abdomen, measuring up to 3.8 cm in caliber. No obvious free air on supine radiograph. IMPRESSION: 1. Interval advancement of esophagogastric tube, tip and side port well below the diaphragm. 2. Similar appearance of gas-filled, mildly distended loops of small bowel throughout the mid abdomen. Electronically Signed   By: Delanna Ahmadi M.D.   On: 07/15/2022 18:48   DG CHEST PORT 1 VIEW  Result Date: 07/15/2022 CLINICAL DATA:  Placement of endotracheal tube and NG tube EXAM: PORTABLE CHEST 1 VIEW COMPARISON:  Previous studies including the examination of 07/13/2022 FINDINGS: Cardiac size is within normal limits. There is interval appearance of new infiltrate in left lower lung field obscuring the left hemidiaphragm. Rest of the lung fields are clear. There is no pneumothorax. Tip of endotracheal tube is 5.1 cm above the carina. Tip of NG tube is seen in the fundus of  the stomach. Side port in the NG tube is seen at the gastroesophageal junction. IMPRESSION: Increased density in left lower lung fields suggest atelectasis/pneumonia and possibly pleural effusion. Tip of enteric tube is seen in the fundus of the stomach. Side-port in the enteric tube is noted at the gastroesophageal junction. The tube should be advanced 5-10 cm to place the side port within the stomach. Electronically Signed   By: Elmer Picker M.D.   On: 07/15/2022 14:12   DG Abd Portable 1V  Result Date: 07/15/2022 CLINICAL DATA:  Follow-up small bowel obstruction and nasogastric tube placement. EXAM: PORTABLE ABDOMEN - 1 VIEW COMPARISON:  07/14/2022 FINDINGS: Orogastric tube tip is located in the gastric fundus. Increased dilatation of multiple small bowel loops seen in the abdomen and upper pelvis, with a lack of colonic gas, consistent with small-bowel obstruction. Right upper quadrant surgical clips are seen from prior cholecystectomy. IMPRESSION: Nasogastric tube tip in gastric fundus. Increased small bowel dilatation, consistent with small-bowel obstruction. Electronically Signed   By: Marlaine Hind M.D.   On: 07/15/2022 05:07  DG Abd Portable 1V  Result Date: 07/14/2022 CLINICAL DATA:  737106 Encounter for imaging study to confirm nasogastric (NG) tube placement [id number] EXAM: PORTABLE ABDOMEN - 1 VIEW COMPARISON:  Same day radiograph FINDINGS: Nausea gastric tube tip overlies the stomach, side port near the GE junction. Prominent loop of bowel in the right lower quadrant. Paucity of small bowel gas. Right upper quadrant surgical clips. IMPRESSION: Nasogastric tube tip overlies the stomach, side port near the GE junction Consider Vance mint by 2.5 cm. Focal prominent loop of gas distended bowel in the right lower quadrant, similar to prior exam. Paucity of small bowel gas. Electronically Signed   By: Maurine Simmering M.D.   On: 07/14/2022 10:33   ECHOCARDIOGRAM COMPLETE  Result Date:  07/14/2022    ECHOCARDIOGRAM REPORT   Patient Name:   ADINE HEIMANN Date of Exam: 07/14/2022 Medical Rec #:  [id number]     Height:       66.0 in Accession #:    [id number]    Weight:       126.0 lb Date of Birth:  Feb 28, 1956     BSA:          1.643 m Patient Age:    44 years      BP:           137/76 mmHg Patient Gender: F             HR:           88 bpm. Exam Location:  Inpatient Procedure: 2D Echo, Color Doppler and Cardiac Doppler Indications:    R94.31 Abnormal EKG  History:        Patient has no prior history of Echocardiogram examinations.                 Risk Factors:Dyslipidemia.  Sonographer:    Raquel Sarna Senior RDCS Referring Phys: [place]  1. Left ventricular ejection fraction, by estimation, is 65 to 70%. The left ventricle has normal function. Mild LV mid-cavity gradient, peak 18 mmHg. The left ventricle has no regional wall motion abnormalities. There is mild concentric left ventricular hypertrophy. Left ventricular diastolic parameters are consistent with Grade I diastolic dysfunction (impaired relaxation).  2. Right ventricular systolic function is normal. The right ventricular size is normal. Tricuspid regurgitation signal is inadequate for assessing PA pressure.  3. The aortic valve is tricuspid. Aortic valve regurgitation is not visualized. No aortic stenosis is present.  4. The mitral valve is normal in structure. No evidence of mitral valve regurgitation. No evidence of mitral stenosis.  5. The inferior vena cava is normal in size with greater than 50% respiratory variability, suggesting right atrial pressure of 3 mmHg. FINDINGS  Left Ventricle: Left ventricular ejection fraction, by estimation, is 65 to 70%. The left ventricle has normal function. The left ventricle has no regional wall motion abnormalities. The left ventricular internal cavity size was normal in size. There is  mild concentric left ventricular hypertrophy. Left ventricular diastolic parameters are  consistent with Grade I diastolic dysfunction (impaired relaxation). Right Ventricle: The right ventricular size is normal. No increase in right ventricular wall thickness. Right ventricular systolic function is normal. Tricuspid regurgitation signal is inadequate for assessing PA pressure. Left Atrium: Left atrial size was normal in size. Right Atrium: Right atrial size was normal in size. Pericardium: There is no evidence of pericardial effusion. Mitral Valve: The mitral valve is normal in structure. No evidence of mitral valve regurgitation. No evidence of mitral  valve stenosis. Tricuspid Valve: The tricuspid valve is normal in structure. Tricuspid valve regurgitation is trivial. Aortic Valve: The aortic valve is tricuspid. Aortic valve regurgitation is not visualized. No aortic stenosis is present. Pulmonic Valve: The pulmonic valve was normal in structure. Pulmonic valve regurgitation is not visualized. Aorta: The aortic root is normal in size and structure. Venous: The inferior vena cava is normal in size with greater than 50% respiratory variability, suggesting right atrial pressure of 3 mmHg. IAS/Shunts: No atrial level shunt detected by color flow Doppler.  LEFT VENTRICLE PLAX 2D LVIDd:         3.90 cm   Diastology LVIDs:         2.70 cm   LV e' medial:    6.20 cm/s LV PW:         0.90 cm   LV E/e' medial:  6.8 LV IVS:        0.80 cm   LV e' lateral:   9.36 cm/s LVOT diam:     1.90 cm   LV E/e' lateral: 4.5 LV SV:         47 LV SV Index:   28 LVOT Area:     2.84 cm  RIGHT VENTRICLE RV S prime:     13.65 cm/s TAPSE (M-mode): 2.0 cm LEFT ATRIUM             Index        RIGHT ATRIUM          Index LA diam:        2.40 cm 1.46 cm/m   RA Area:     8.19 cm LA Vol (A2C):   26.7 ml 16.25 ml/m  RA Volume:   13.40 ml 8.15 ml/m LA Vol (A4C):   28.3 ml 17.22 ml/m LA Biplane Vol: 29.1 ml 17.71 ml/m  AORTIC VALVE LVOT Vmax:   104.00 cm/s LVOT Vmean:  67.000 cm/s LVOT VTI:    0.165 m  AORTA Ao Root diam: 2.80 cm Ao  Asc diam:  2.70 cm MITRAL VALVE MV Area (PHT): 3.27 cm    SHUNTS MV Decel Time: 232 msec    Systemic VTI:  0.16 m MV E velocity: 42.40 cm/s  Systemic Diam: 1.90 cm MV A velocity: 73.70 cm/s MV E/A ratio:  0.58 Dalton McleanMD Electronically signed by Franki Monte Signature Date/Time: 07/14/2022/10:31:44 AM    Final    DG Abd Portable 1V-Small Bowel Obstruction Protocol-initial, 8 hr delay  Result Date: 07/14/2022 CLINICAL DATA:  Small bowel follow up EXAM: PORTABLE ABDOMEN - 1 VIEW COMPARISON:  Yesterday FINDINGS: Hazy/dilute contrast seen could be present within small bowel loops, no contrast seen in colon. Excreting contrast in the kidneys and bladder. No concerning mass effect or calcification. Ingested pills in the right lower quadrant. Bone island appearance at the left ilium. IMPRESSION: No contrast seen at the colon. Electronically Signed   By: Jorje Guild M.D.   On: 07/14/2022 07:39   DG Abd Portable 1V-Small Bowel Protocol-Position Verification  Result Date: 07/13/2022 CLINICAL DATA:  NG tube EXAM: PORTABLE ABDOMEN - 1 VIEW COMPARISON:  Abdominal x-ray 07/13/2022 FINDINGS: Nasogastric tube tip is now in the proximal body of the stomach. No dilated bowel loops are seen. IMPRESSION: Nasogastric tube tip in the proximal body of the stomach. Electronically Signed   By: Ronney Asters M.D.   On: 07/13/2022 22:36   DG Abd Portable 1V-Small Bowel Protocol-Position Verification  Result Date: 07/13/2022 CLINICAL DATA:  NG tube EXAM: PORTABLE  ABDOMEN - 1 VIEW COMPARISON:  None Available. FINDINGS: Nasogastric tube tip is in the gastric fundus with sidehole at the level of the gastroesophageal junction. There is some gaseous distention of bowel in the central abdomen. There is residual contrast in the renal collecting systems. Cholecystectomy clips are present. Lung bases are clear. IMPRESSION: Nasogastric tube tip is in the gastric fundus with sidehole at the level of the gastroesophageal  junction. Recommend advancing tube. Electronically Signed   By: Ronney Asters M.D.   On: 07/13/2022 22:09   CT ABDOMEN PELVIS W CONTRAST  Result Date: 07/13/2022 CLINICAL DATA:  Abdominal pain EXAM: CT ABDOMEN AND PELVIS WITH CONTRAST TECHNIQUE: Multidetector CT imaging of the abdomen and pelvis was performed using the standard protocol following bolus administration of intravenous contrast. RADIATION DOSE REDUCTION: This exam was performed according to the departmental dose-optimization program which includes automated exposure control, adjustment of the mA and/or kV according to patient size and/or use of iterative reconstruction technique. CONTRAST:  152m OMNIPAQUE IOHEXOL 300 MG/ML  SOLN COMPARISON:  None Available. FINDINGS: Lower chest: No acute abnormality. Hepatobiliary: No focal liver abnormality is seen. Status post cholecystectomy. No biliary dilatation. Pancreas: Unremarkable. No pancreatic ductal dilatation or surrounding inflammatory changes. Spleen: Normal in size without focal abnormality. Adrenals/Urinary Tract: Bilateral adrenal glands are unremarkable. No hydronephrosis or nephrolithiasis. Bladder is unremarkable. Stomach/Bowel: Multiple dilated and thick-walled loops of small bowel are clustered with transition point in the left pelvis, on series 2, image 59 with decompressed distal small bowel exiting neck of the internal hernia which is best appreciated on coronal series 5, image 71, and upstream dilation of small bowel loops. Engorgement of the mesenteric vessels. Normal appendix. Vascular/Lymphatic: Aortic atherosclerosis. No enlarged abdominal or pelvic lymph nodes. Reproductive: No adnexal masses. Other:  Trace free fluid of the right hemiabdomen. Musculoskeletal: No acute or significant osseous findings. IMPRESSION: 1. Multiple dilated and thick-walled loops of small bowel are clustered within the pelvis with internal hernia neck in the left pelvis, findings are consistent with small  bowel obstruction secondary to internal hernia. Engorgement of the mesenteric vessels and small bowel wall thickening, raises concern for ischemia. No evidence of pneumatosis or free air. 2. Trace free fluid of the right hemiabdomen. 3. Aortic Atherosclerosis (ICD10-I70.0). Critical Value/emergent results were called by telephone at the time of interpretation on 07/13/2022 at 8:28 pm to [facility], who verbally acknowledged these results. Electronically Signed   By: LYetta GlassmanM.D.   On: 07/13/2022 20:32   DG Chest Portable 1 View  Result Date: 07/13/2022 CLINICAL DATA:  Nausea vomiting EXAM: PORTABLE CHEST 1 VIEW COMPARISON:  07/23/2012 FINDINGS: The heart size and mediastinal contours are within normal limits. Aortic atherosclerosis. Both lungs are clear. The visualized skeletal structures are unremarkable. IMPRESSION: No active disease. Electronically Signed   By: KDonavan FoilM.D.   On: 07/13/2022 19:15    Microbiology: Results for orders placed or performed during the hospital encounter of 07/13/22  Culture, blood (routine x 2)     Status: None   Collection Time: 07/13/22  6:37 PM   Specimen: Right Antecubital; Blood  Result Value Ref Range Status   Specimen Description   Final    RIGHT ANTECUBITAL BLOOD Performed at [facility] [address] [place] [address]   Special Requests   Final    Blood Culture results may not be optimal due to an excessive volume of blood received in culture bottles BOTTLES DRAWN AEROBIC AND  ANAEROBIC Performed at [facility], [place] [address], [place], [place] [address]    Culture   Final    NO GROWTH 5 DAYS Performed at [facility], [place] [address] [place], [place] [address]    Report Status 07/18/2022 FINAL  Final  Culture, blood (routine x 2)     Status: None   Collection Time: 07/13/22  6:50 PM   Specimen: BLOOD RIGHT FOREARM  Result Value Ref Range Status   Specimen  Description   Final    BLOOD RIGHT FOREARM BLOOD Performed at [place] [address], [place], [place] [address]    Special Requests   Final    Blood Culture adequate volume BOTTLES DRAWN AEROBIC AND ANAEROBIC Performed at [place] [address] [place], [place] [address]    Culture   Final    NO GROWTH 5 DAYS Performed at [facility], [place] [address], [place], [place] [address]    Report Status 07/18/2022 FINAL  Final  Resp panel by RT-PCR (RSV, Flu A&B, Covid) Anterior Nasal Swab     Status: None   Collection Time: 07/13/22  6:58 PM   Specimen: Anterior Nasal Swab  Result Value Ref Range Status   SARS Coronavirus 2 by RT PCR NEGATIVE NEGATIVE Final    Comment: (NOTE) SARS-CoV-2 target nucleic acids are NOT DETECTED.  The SARS-CoV-2 RNA is generally detectable in upper respiratory specimens during the acute phase of infection. The lowest concentration of SARS-CoV-2 viral copies this assay can detect is 138 copies/mL. A negative result does not preclude SARS-Cov-2 infection and should not be used as the sole basis for treatment or other patient management decisions. A negative result may occur with  improper specimen collection/handling, submission of specimen other than nasopharyngeal swab, presence of viral mutation(s) within the areas targeted by this assay, and inadequate number of viral copies(<138 copies/mL). A negative result must be combined with clinical observations, patient history, and epidemiological information. The expected result is Negative.  Fact Sheet for Patients:  EntrepreneurPulse.com.au  Fact Sheet for Healthcare Providers:  IncredibleEmployment.be  This test is no t yet approved or cleared by the [place] FDA and  has been authorized for detection and/or diagnosis of SARS-CoV-2 by FDA under an Emergency Use Authorization (EUA). This EUA will remain  in  effect (meaning this test can be used) for the duration of the COVID-19 declaration under Section 564(b)(1) of the Act, 21 U.S.C.section 360bbb-3(b)(1), unless the authorization is terminated  or revoked sooner.       Influenza A by PCR NEGATIVE NEGATIVE Final   Influenza B by PCR NEGATIVE NEGATIVE Final    Comment: (NOTE) The Xpert Xpress SARS-CoV-2/FLU/RSV plus assay is intended as an aid in the diagnosis of influenza from Nasopharyngeal swab specimens and should not be used as a sole basis for treatment. Nasal washings and aspirates are unacceptable for Xpert Xpress SARS-CoV-2/FLU/RSV testing.  Fact Sheet for Patients: EntrepreneurPulse.com.au  Fact Sheet for Healthcare Providers: IncredibleEmployment.be  This test is not yet approved or cleared by the [place] FDA and has been authorized for detection and/or diagnosis of SARS-CoV-2 by FDA under an Emergency Use Authorization (EUA). This EUA will remain in effect (meaning this test can be used) for the duration of the COVID-19 declaration under Section 564(b)(1) of the Act, 21 U.S.C. section 360bbb-3(b)(1), unless the authorization is terminated or revoked.     Resp Syncytial Virus by PCR NEGATIVE NEGATIVE Final  Comment: (NOTE) Fact Sheet for Patients: EntrepreneurPulse.com.au  Fact Sheet for Healthcare Providers: IncredibleEmployment.be  This test is not yet approved or cleared by the [place] FDA and has been authorized for detection and/or diagnosis of SARS-CoV-2 by FDA under an Emergency Use Authorization (EUA). This EUA will remain in effect (meaning this test can be used) for the duration of the COVID-19 declaration under Section 564(b)(1) of the Act, 21 U.S.C. section 360bbb-3(b)(1), unless the authorization is terminated or revoked.  Performed at [facility], [place] [address] [place], [place] [address]    MRSA Next Gen by PCR, Nasal     Status: None   Collection Time: 07/14/22 12:08 AM   Specimen: Nasal Mucosa; Nasal Swab  Result Value Ref Range Status   MRSA by PCR Next Gen NOT DETECTED NOT DETECTED Final    Comment: (NOTE) The GeneXpert MRSA Assay (FDA approved for NASAL specimens only), is one component of a comprehensive MRSA colonization surveillance program. It is not intended to diagnose MRSA infection nor to guide or monitor treatment for MRSA infections. Test performance is not FDA approved in patients less than 65 years old. Performed at [facility], [place] [address] [place], [place] [address]   Culture, blood (Routine X 2) w Reflex to ID Panel     Status: None   Collection Time: 07/15/22  8:53 AM   Specimen: BLOOD  Result Value Ref Range Status   Specimen Description   Final    BLOOD BLOOD LEFT HAND Performed at [place] [address] [place], [place] [address]    Special Requests   Final    BOTTLES DRAWN AEROBIC AND ANAEROBIC Blood Culture results may not be optimal due to an inadequate volume of blood received in culture bottles Performed at [place] [address], [place], [place] [address]    Culture   Final    NO GROWTH 5 DAYS Performed at [facility], [place] [address] [place], [place] [address]    Report Status 07/20/2022 FINAL  Final  Culture, blood (Routine X 2) w Reflex to ID Panel     Status: None   Collection Time: 07/15/22  8:53 AM   Specimen: BLOOD  Result Value Ref Range Status   Specimen Description   Final    BLOOD BLOOD LEFT ARM Performed at [place] [address] [place], [place] [address]    Special Requests   Final    BOTTLES DRAWN AEROBIC AND ANAEROBIC Blood Culture results may not be optimal due to an inadequate volume of blood received in culture bottles Performed at [place] [address] [place], [place]  [address]    Culture   Final    NO GROWTH 5 DAYS Performed at [facility], [place] [address] [place], [place] [address]    Report Status 07/20/2022 FINAL  Final  Expectorated Sputum Assessment w Gram Stain, Rflx to Resp Cult     Status: None   Collection Time: 07/18/22  8:46 AM   Specimen: Sputum  Result Value Ref Range Status   Specimen Description SPUTUM  Final   Special Requests NONE  Final   Sputum evaluation   Final    THIS SPECIMEN IS ACCEPTABLE FOR SPUTUM CULTURE Performed at [facility], [place] [address], [place], [place] [address]    Report Status 07/18/2022 FINAL  Final  Culture, Respiratory w Gram Stain  Status: None   Collection Time: 07/18/22  8:46 AM   Specimen: SPU  Result Value Ref Range Status   Specimen Description   Final    SPUTUM Performed at [place] [address] [place], [place] [address]    Special Requests   Final    NONE Reflexed from [id number] Performed at [facility], [place] [address], [place], [place] [address]    Gram Stain   Final    FEW WBC PRESENT, PREDOMINANTLY PMN RARE GRAM POSITIVE COCCI IN PAIRS RARE GRAM NEGATIVE RODS    Culture   Final    RARE Normal respiratory flora-no Staph aureus or Pseudomonas seen Performed at [facility] [address], [place], [place] [address]    Report Status 07/20/2022 FINAL  Final    Labs: CBC: Recent Labs  Lab 07/15/22 0258 07/16/22 0430 07/17/22 0500 07/18/22 0524 07/19/22 0446 07/20/22 0652 07/21/22 0550  WBC 14.6*   < > 5.7 6.0 4.8 4.4 4.6  NEUTROABS 12.2*  --  4.7  --   --   --   --   HGB 12.9   < > 7.9* 9.4* 9.4* 9.3* 9.1*  HCT 40.9   < > 24.1* 28.7* 28.1* 27.9* 27.3*  MCV 97.4   < > 94.9 94.4 92.1 92.1 92.5  PLT 209   < > 112* 154 148* 160 171   < > = values in this interval not displayed.   Basic Metabolic Panel: Recent Labs  Lab 07/15/22 0258 07/16/22 0430 07/17/22 0500 07/18/22 0524 07/19/22 0446  07/20/22 0652 07/21/22 0550  NA 139 142 145 137 138 138 140  K 4.1 4.0 2.8* 3.9 3.5 3.6 3.3*  CL 109 110 117* 103 101 102 102  CO2 20* '28 23 27 29 29 29  '$ GLUCOSE 163* 111* 72 96 102* 119* 110*  BUN 27* '21 12 12 '$ 7* 6* 6*  CREATININE 1.23* 0.84 0.56 0.77 0.68 0.67 0.70  CALCIUM 8.5* 8.3* 7.2* 8.2* 8.4* 8.3* 8.4*  MG 1.8 1.8 1.8 2.4  --   --   --   PHOS  --   --  1.7* 1.7* 2.7 3.2  --    Liver Function Tests: Recent Labs  Lab 07/16/22 0430 07/17/22 0500 07/18/22 0524 07/19/22 0446 07/20/22 0652  AST 48* 86* 57* 22 12*  ALT 36 72* 73* 49* 32  ALKPHOS 38 33* 39 40 32*  BILITOT 0.5 0.7 0.8 0.6 0.6  PROT 5.9* 4.8* 5.5* 5.6* 5.2*  ALBUMIN 2.9* 2.2* 2.5* 2.7* 2.4*   CBG: Recent Labs  Lab 07/15/22 1657 07/15/22 1946 07/21/22 1120  GLUCAP 178* 106* 132*    Discharge time spent: greater than 30 minutes.  Signed: Cristela Felt, MD [facility] 07/21/2022

## 2022-07-21 NOTE — Progress Notes (Signed)
Physical Therapy Treatment Patient Details Name: [name] MRN: [id number] DOB: 05-Mar-1956 Today's Date: 07/21/2022   History of Present Illness Pt admitted from home 07/13/22 with abdominal pain 2* SBO and not s/p Exploratory lap, lysis of adhesions, and small bowel resection with primary anastomosis    PT Comments    Pt continues very cooperative and progressing well with mobility.  Pt mod I for all basic mobility tasks including ambulating in hall (stepping fwd, bkwd and sideways) with good safety awareness and min instability.  Pt hopeful for dc from hospital soon.  Pt with no further PT needs and will be signed off to mobility team for remainder of hospital stay.   Recommendations for follow up therapy are one component of a multi-disciplinary discharge planning process, led by the attending physician.  Recommendations may be updated based on patient status, additional functional criteria and insurance authorization.  Follow Up Recommendations  No PT follow up     Assistance Recommended at Discharge PRN  Patient can return home with the following A little help with bathing/dressing/bathroom;Assist for transportation;Assistance with cooking/housework;Help with stairs or ramp for entrance   Equipment Recommendations  None recommended by PT    Recommendations for Other Services       Precautions / Restrictions Precautions Precautions: Fall;Other (comment) Precaution Comments: s/p abdominal surgery Restrictions Weight Bearing Restrictions: No     Mobility  Bed Mobility Overal bed mobility: Modified Independent             General bed mobility comments: no physical assist    Transfers Overall transfer level: Modified independent                      Ambulation/Gait Ambulation/Gait assistance: Supervision, Independent Gait Distance (Feet): 500 Feet Assistive device: None Gait Pattern/deviations: WFL(Within Functional Limits) Gait velocity: mod  pace     General Gait Details: steady, no loss of balance   Stairs             Wheelchair Mobility    Modified Rankin (Stroke Patients Only)       Balance Overall balance assessment: Needs assistance Sitting-balance support: No upper extremity supported, Feet supported Sitting balance-Leahy Scale: Good     Standing balance support: No upper extremity supported Standing balance-Leahy Scale: Good                              Cognition Arousal/Alertness: Awake/alert Behavior During Therapy: WFL for tasks assessed/performed Overall Cognitive Status: Within Functional Limits for tasks assessed                                          Exercises      General Comments        Pertinent Vitals/Pain Pain Assessment Pain Assessment: No/denies pain    Home Living                          Prior Function            PT Goals (current goals can now be found in the care plan section) Acute Rehab PT Goals Patient Stated Goal: Regain IND PT Goal Formulation: With patient Time For Goal Achievement: 07/31/22 Potential to Achieve Goals: Good Progress towards PT goals: Progressing toward goals    Frequency  Min 3X/week      PT Plan Current plan remains appropriate    Co-evaluation              AM-PAC PT "6 Clicks" Mobility   Outcome Measure  Help needed turning from your back to your side while in a flat bed without using bedrails?: None Help needed moving from lying on your back to sitting on the side of a flat bed without using bedrails?: None Help needed moving to and from a bed to a chair (including a wheelchair)?: None Help needed standing up from a chair using your arms (e.g., wheelchair or bedside chair)?: None Help needed to walk in hospital room?: None Help needed climbing 3-5 steps with a railing? : A Little 6 Click Score: 23    End of Session Equipment Utilized During Treatment: Gait  belt Activity Tolerance: Patient tolerated treatment well Patient left: in bed;with call bell/phone within reach;with family/visitor present Nurse Communication: Mobility status PT Visit Diagnosis: Unsteadiness on feet (R26.81);Muscle weakness (generalized) (M62.81);Difficulty in walking, not elsewhere classified (R26.2)     Time: 6433-2951 PT Time Calculation (min) (ACUTE ONLY): 13 min  Charges:  $Gait Training: 8-22 mins                     [place] Pager [phone] Office [phone]    Crockett Rallo 07/21/2022, 2:28 PM

## 2022-07-21 NOTE — TOC Progression Note (Addendum)
Transition of Care [facility]) - Progression Note    Patient Details  Name: [name] MRN: [id number] Date of Birth: [birth year]  Transition of Care [facility]) CM/SW Contact  Tarsha Blando, Juliann Pulse, RN Phone Number: 07/21/2022, 11:30 AM  Clinical Narrative: Wound measurements provided to [facility] rep Tracy-she will process for wound vac auth-await outcome.   -2p-wound vac released to be delivered to rm prior d/c. HHC all set up. No further CM needs.   Expected Discharge Plan: Home/Self Care Barriers to Discharge: Continued Medical Work up  Expected Discharge Plan and Services   Discharge Planning Services: CM Consult Post Acute Care Choice:  (No [place] agency to accept) Living arrangements for the past 2 months: Single Family Home                 DME Arranged: Vac DME Agency: [facility] Date DME Agency Contacted: 07/20/22 Time DME Agency Contacted: [phone] Representative spoke with at DME Agency: [place]:  (No [place] agency for [facility] to accept)           Social Determinants of Health (SDOH) Interventions [place]: No Food Insecurity (07/14/2022)  Housing: Low Risk  (07/14/2022)  Transportation Needs: No Transportation Needs (07/14/2022)  Utilities: Not At Risk (07/14/2022)  Depression (PHQ2-9): High Risk (04/12/2022)  Tobacco Use: High Risk (07/19/2022)    Readmission Risk Interventions     No data to display

## 2022-07-21 NOTE — Progress Notes (Signed)
Patient discharging home with family.  VAC delivered to floor and successfully applied to current VAC dressing.  Reviewed materials with patient and [place] set up per CM. AVS and medications reviewed, instructed to follow up with Sx and PCP in 1 week.  Pt verbalizes understanding.  No questions at this time, awaiting arrival of granddaughter for transport in NAD.  IV removed - WNL.

## 2022-07-21 NOTE — Progress Notes (Signed)
Consultation Progress Note   Patient: [name] AJO:[id number] DOB: 1956-05-24 DOA: 07/13/2022 DOS: the patient was seen and examined on 07/21/2022 Primary service: DibiaManfred Shirts, MD  Brief hospital course: H/o abdominal hysterectomy presents with ab pain, n/v, found to have sbo,has hypothermia, ng placed, admitted to progressive bed on warming blanket    Assessment and Plan: Principal Problem:   Closed loop SBO with necrosis s/p SB resection 07/15/2022 Active Problems:   GERD   Sinus bradycardia   HLD (hyperlipidemia)   SBO (small bowel obstruction) (HCC)   Hypothermia   Prolonged QT interval     Sbo S/p Exploratory laparotomy, lysis of adhesions, small bowel resection with primary anastomosis , post op  was intubated  and on pressor briefly.  + wound vac, On rocephin/flagyl Started to have bm, diet advancement per gen surg Appreciate gen surg     Cough  Blood culture no growth  Lung exam bilateral lower lobe rhonchi chest X-ray possible atelectasis  f/u on sputum culture Already on abx, encourage incentive spirometer, mobilize    Hypokalemia/hypomagnesemia/hypophosphatemia Replaced and normalized      Hypothermia, POA, resolved  Blood culture no growth Cortisol level elevated    Sinus bradycardia, POA,  resolved  Tsh 0.6 Resolved, echocardiogram no acute findings,  monitor    Qtc 490 Keep K>4, mag >2 Resolved on Repeat EKG  Discontinue tele   Impaired fasting blood glucose Denies h/o DM2, possible due to stress, improved  A1c 6%     UDS+ cocaine, THC   Cigarette smoking ,smoking cessation education provided, she declined nicotine patch    I have Reviewed nursing notes, Vitals, pain scores, I/o's, Lab results and  imaging results since pt's last encounter, details please see discussion above              TRH will continue to follow the patient.       TRH will continue to follow the patient.  Subjective: Cleared from surgical standpoint  for DC, await wound vacc approval. She has no complaints this morning  Physical Exam: Vitals:   07/20/22 1737 07/20/22 2024 07/21/22 0422 07/21/22 0818  BP:  119/76 112/69   Pulse:  72 77   Resp:  17 20   Temp:  98.3 F (36.8 C) 98.8 F (37.1 C)   TempSrc:  Oral    SpO2: 92% 96% 96% 97%  Weight:      Height:      General exam: aaox3, pleasant , edentulous  Respiratory system: bilateral  lower lobe rhonchi, normal respiratory effort   Cardiovascular system:  RRR.  Gastrointestinal system: Abdomen post op changes., + bowel sounds, + wound vac Central nervous system: aaox3 Extremities:  no edema Skin: No rashes, lesions or ulcers Psychiatry: pleasant     Data Reviewed:  There are no new results to review at this time.  Family Communication:   Time spent: 15 minutes.  Author: Cristela Felt, MD 07/21/2022 10:13 AM  For on call review [email or web address].

## 2022-07-22 ENCOUNTER — Other Ambulatory Visit: Payer: Self-pay | Admitting: Internal Medicine

## 2022-07-22 ENCOUNTER — Telehealth: Payer: Self-pay

## 2022-07-22 DIAGNOSIS — K219 Gastro-esophageal reflux disease without esophagitis: Secondary | ICD-10-CM

## 2022-07-22 NOTE — Telephone Encounter (Signed)
Transition Care Management Follow-up Telephone Call Date of discharge and from where: [name] 07/21/2022 How have you been since you were released from the hospital? better Any questions or concerns? No  Items Reviewed: Did the pt receive and understand the discharge instructions provided? Yes  Medications obtained and verified? Yes  Other? No  Any new allergies since your discharge? No  Dietary orders reviewed? Yes Do you have support at home? Yes   Home Care and Equipment/Supplies: Were home health services ordered? yes If so, what is the name of the agency? [facility]  Has the agency set up a time to come to the patient's home? yes Were any new equipment or medical supplies ordered?  No What is the name of the medical supply agency? N/a Were you able to get the supplies/equipment? not applicable Do you have any questions related to the use of the equipment or supplies? No  Functional Questionnaire: (I = Independent and D = Dependent) ADLs: I  Bathing/Dressing- I  Meal Prep- I  Eating- I  Maintaining continence- I  Transferring/Ambulation- I  Managing Meds- I  Follow up appointments reviewed:  PCP Hospital f/u appt confirmed? No  no avail appt times sent message to staff to schedule Specialist Hospital f/u appt confirmed? No  Are transportation arrangements needed? No  If their condition worsens, is the pt aware to call PCP or go to the Emergency Dept.? Yes Was the patient provided with contact information for the PCP's office or ED? Yes Was to pt encouraged to call back with questions or concerns? Yes Juanda Crumble, LPN [place] Direct Dial [phone]

## 2022-08-10 ENCOUNTER — Telehealth: Payer: Self-pay

## 2022-08-12 ENCOUNTER — Telehealth: Payer: Self-pay

## 2022-08-12 NOTE — Telephone Encounter (Signed)
[name] from [place] called asking for orders. The wound vac no longer seals on the wound so she has requested an order to clean wound, pat dry, apply wet/dry dressing and cover with ABD pad 1-2x day. Verbal has been left on VM.

## 2022-09-06 ENCOUNTER — Ambulatory Visit (INDEPENDENT_AMBULATORY_CARE_PROVIDER_SITE_OTHER): Payer: 59 | Admitting: *Deleted

## 2022-09-06 ENCOUNTER — Encounter: Payer: Self-pay | Admitting: Internal Medicine

## 2022-09-06 ENCOUNTER — Encounter: Payer: Self-pay | Admitting: *Deleted

## 2022-09-06 ENCOUNTER — Ambulatory Visit (INDEPENDENT_AMBULATORY_CARE_PROVIDER_SITE_OTHER): Payer: 59 | Admitting: Internal Medicine

## 2022-09-06 ENCOUNTER — Other Ambulatory Visit: Payer: Self-pay | Admitting: Internal Medicine

## 2022-09-06 VITALS — BP 106/61 | HR 67 | Temp 98.3°F | Wt 124.7 lb

## 2022-09-06 VITALS — BP 106/61 | HR 67 | Temp 98.3°F | Ht 66.0 in | Wt 124.8 lb

## 2022-09-06 DIAGNOSIS — E782 Mixed hyperlipidemia: Secondary | ICD-10-CM

## 2022-09-06 DIAGNOSIS — Z0000 Encounter for general adult medical examination without abnormal findings: Secondary | ICD-10-CM

## 2022-09-06 DIAGNOSIS — K559 Vascular disorder of intestine, unspecified: Secondary | ICD-10-CM

## 2022-09-06 DIAGNOSIS — K219 Gastro-esophageal reflux disease without esophagitis: Secondary | ICD-10-CM

## 2022-09-06 DIAGNOSIS — Z720 Tobacco use: Secondary | ICD-10-CM

## 2022-09-06 DIAGNOSIS — F17210 Nicotine dependence, cigarettes, uncomplicated: Secondary | ICD-10-CM

## 2022-09-06 DIAGNOSIS — D649 Anemia, unspecified: Secondary | ICD-10-CM | POA: Diagnosis not present

## 2022-09-06 DIAGNOSIS — D72819 Decreased white blood cell count, unspecified: Secondary | ICD-10-CM

## 2022-09-06 DIAGNOSIS — F3340 Major depressive disorder, recurrent, in remission, unspecified: Secondary | ICD-10-CM

## 2022-09-06 DIAGNOSIS — R7303 Prediabetes: Secondary | ICD-10-CM

## 2022-09-06 DIAGNOSIS — F17200 Nicotine dependence, unspecified, uncomplicated: Secondary | ICD-10-CM

## 2022-09-06 DIAGNOSIS — E876 Hypokalemia: Secondary | ICD-10-CM

## 2022-09-06 DIAGNOSIS — Z13820 Encounter for screening for osteoporosis: Secondary | ICD-10-CM

## 2022-09-06 NOTE — Patient Instructions (Signed)

## 2022-09-06 NOTE — Patient Instructions (Addendum)
[name], it was a pleasure seeing you today! You endorsed feeling well today. Below are some of the things we talked about this visit. We look forward to seeing you in the follow up appointment!  Today we discussed: Continue taking your medications.  You need mammogram done and cancer screening done.   I have ordered the following labs today:  Lab Orders         CBC no Diff         BMP8+Anion Gap         Magnesium         Lipid Profile       Referrals ordered today:   Referral Orders  No referral(s) requested today     I have ordered the following medication/changed the following medications:   Stop the following medications: Medications Discontinued During This Encounter  Medication Reason   omeprazole (PRILOSEC) 40 MG capsule Duplicate     Start the following medications: No orders of the defined types were placed in this encounter.    Follow-up: 1 month follow up  Please make sure to arrive 15 minutes prior to your next appointment. If you arrive late, you may be asked to reschedule.   We look forward to seeing you next time. Please call our clinic at [phone] if you have any questions or concerns. The best time to call is Monday-Friday from 9am-4pm, but there is someone available 24/7. If after hours or the weekend, call the main hospital number and ask for the Internal Medicine Resident On-Call. If you need medication refills, please notify your pharmacy one week in advance and they will send [place] a request.  Thank you for letting us take part in your care. Wishing you the best!  Thank you, Idamae Schuller, MD

## 2022-09-06 NOTE — Progress Notes (Unsigned)
CC: Follow up  HPI:  [name] is a 67 y.o. with medical history of HLD, GERD, tobacco use disorder, and MDD presenting to [facility] for a follow up presenting to [facility] for a follow up. Hospitalized from 12/20-12/28 for SBO with necrosis. She s/p resection on 07/15/2022.  Please see problem-based list for further details, assessments, and plans.  Past Medical History:  Diagnosis Date   Back pain 07/07/2009   Bilateral knee pain 05/29/2012   Standing films did not reveal much joint space loss so I wonder if most of her arthritic changes are in the patellofemoral joint. She has received significant relief from corticosteroid injection. If we need to further investigate this would recommend MRI of the knee. Right knee seems worse than left.   Bradycardia 05/15/2006   Bursitis of right shoulder 05/10/2010   Depression 05/15/2006   Ecchymoses, spontaneous 08/12/2010   GERD (gastroesophageal reflux disease) 05/15/2006   Inguinal pain 03/23/2010   right   Intertrigo 03/04/2014   Left hip pain 11/25/2019   Meralgia paresthetica of right side 05/08/2019   Personal history of goiter 05/15/2006   Right hand pain 11/25/2020   Shoulder pain, right 07/07/2009   Smoker 05/10/2010   Tendinopathy of left rotator cuff 09/09/2016     Current Outpatient Medications (Cardiovascular):    rosuvastatin (CRESTOR) 20 MG tablet, Take 1 tablet (20 mg total) by mouth daily.  Current Outpatient Medications (Respiratory):    cetirizine (ZYRTEC ALLERGY) 10 MG tablet, Take 1 tablet (10 mg total) by mouth daily. (Patient not taking: Reported on 07/13/2022)  Current Outpatient Medications (Analgesics):    acetaminophen (TYLENOL) 500 MG tablet, Take 1 tablet (500 mg total) by mouth every 6 (six) hours as needed. (Patient not taking: Reported on 07/13/2022)   Current Outpatient Medications (Other):    cholecalciferol (VITAMIN D3) 25 MCG (1000 UNIT) tablet, Take 2 tablets (2,000 Units total) by mouth daily.    FLUoxetine (PROZAC) 40 MG capsule, Take 80 mg by mouth daily.   gabapentin (NEURONTIN) 300 MG capsule, Take 2 capsules (600 mg total) by mouth 3 (three) times daily. TAKE 2 CAPSULES(600 MG) BY MOUTH THREE TIMES DAILY Strength: 300 mg   Multiple Vitamin (MULTIVITAMIN WITH MINERALS) TABS, Take 1 tablet by mouth daily.   omeprazole (PRILOSEC) 40 MG capsule, Take 40 mg by mouth daily.   omeprazole (PRILOSEC) 40 MG capsule, TAKE 1 CAPSULE(40 MG) BY MOUTH DAILY  Review of Systems:  Review of system negative unless stated in the problem list or HPI.    Physical Exam:  Vitals:   09/06/22 1352  BP: 106/61  Pulse: 67  Temp: 98.3 F (36.8 C)  TempSrc: Oral  SpO2: 98%  Weight: 124 lb 11.2 oz (56.6 kg)    Physical Exam General: NAD HENT: NCAT Lungs: CTAB, no wheeze, rhonchi or rales.  Cardiovascular: Normal heart sounds, no r/m/g, 2+ pulses in all extremities. No LE edema Abdomen: No TTP, normal bowel sounds MSK: No asymmetry or muscle atrophy.  Skin: no lesions noted on exposed skin Neuro: Alert and oriented x4. CN grossly intact Psych: Normal mood and normal affect   Assessment & Plan:   No problem-specific Assessment & Plan notes found for this encounter.   See Encounters Tab for problem based charting.  Patient discussed with Dr. Odella Aquas, MD Tillie Rung. [facility] Internal Medicine Residency, PGY-2   HLD Crestor 20 mg qd. Lipid panel on 03/2022 showed chol 272 LDL 175.Repeat lipid panel.   GERD Well  controlled. Prilosec 40 mg qd.   MDD Prozac 80 mg qd Sees [facility]. Last seen them one month ago.   Tobacco use disorder/polysubstance use.  1/4 ppd.   Prediabetes A1c 6.0 in 07/15/2022  Hypokalemia at 3.3. Repeat today.   Echo with G1DD  CBC to check on anemia

## 2022-09-06 NOTE — Progress Notes (Signed)
Subjective:   [name] is a 67 y.o. female who presents for Medicare Annual (Subsequent) preventive examination.  Review of Systems    Defer to pcp       Objective:    Today's Vitals   09/06/22 1352  BP: 106/61  Pulse: 67  Temp: 98.3 F (36.8 C)  TempSrc: Oral  SpO2: 98%  Weight: 124 lb 12.5 oz (56.6 kg)   Body mass index is 20.14 kg/m.     09/06/2022    1:45 PM 07/15/2022   11:07 AM 07/13/2022    6:15 PM 04/12/2022    2:14 PM 11/02/2021    2:52 PM 05/13/2021   10:41 AM 11/25/2019    1:25 PM  Advanced Directives  Does Patient Have a Medical Advance Directive? No No No No No No No  Would patient like information on creating a medical advance directive? No - Patient declined No - Patient declined No - Patient declined No - Patient declined No - Patient declined No - Patient declined No - Patient declined    Current Medications (verified) Outpatient Encounter Medications as of 09/06/2022  Medication Sig   cholecalciferol (VITAMIN D3) 25 MCG (1000 UNIT) tablet Take 2 tablets (2,000 Units total) by mouth daily.   FLUoxetine (PROZAC) 40 MG capsule Take 80 mg by mouth daily.   gabapentin (NEURONTIN) 300 MG capsule Take 2 capsules (600 mg total) by mouth 3 (three) times daily. TAKE 2 CAPSULES(600 MG) BY MOUTH THREE TIMES DAILY Strength: 300 mg   Multiple Vitamin (MULTIVITAMIN WITH MINERALS) TABS Take 1 tablet by mouth daily.   omeprazole (PRILOSEC) 40 MG capsule TAKE 1 CAPSULE(40 MG) BY MOUTH DAILY   rosuvastatin (CRESTOR) 20 MG tablet Take 1 tablet (20 mg total) by mouth daily.   [DISCONTINUED] acetaminophen (TYLENOL) 500 MG tablet Take 1 tablet (500 mg total) by mouth every 6 (six) hours as needed. (Patient not taking: Reported on 07/13/2022)   [DISCONTINUED] cetirizine (ZYRTEC ALLERGY) 10 MG tablet Take 1 tablet (10 mg total) by mouth daily. (Patient not taking: Reported on 07/13/2022)   [DISCONTINUED] omeprazole (PRILOSEC) 40 MG capsule Take 40 mg by mouth daily.   No  facility-administered encounter medications on file as of 09/06/2022.    Allergies (verified) Aspirin   History: Past Medical History:  Diagnosis Date   Back pain 07/07/2009   Bilateral knee pain 05/29/2012   Standing films did not reveal much joint space loss so I wonder if most of her arthritic changes are in the patellofemoral joint. She has received significant relief from corticosteroid injection. If we need to further investigate this would recommend MRI of the knee. Right knee seems worse than left.   Bradycardia 05/15/2006   Bursitis of right shoulder 05/10/2010   Depression 05/15/2006   Ecchymoses, spontaneous 08/12/2010   GERD (gastroesophageal reflux disease) 05/15/2006   Hypothermia 07/13/2022   Inguinal pain 03/23/2010   right   Intertrigo 03/04/2014   Left hip pain 11/25/2019   Meralgia paresthetica of right side 05/08/2019   Personal history of goiter 05/15/2006   Right hand pain 11/25/2020   Shoulder pain, right 07/07/2009   Sinus bradycardia 11/08/2010   Ongoing for many years, HR is usually 45-55. Workup including 2D-Echo negative.    Smoker 05/10/2010   Tendinopathy of left rotator cuff 09/09/2016   Past Surgical History:  Procedure Laterality Date   ABDOMINAL HYSTERECTOMY     CHOLECYSTECTOMY     LAPAROTOMY N/A 07/15/2022   Procedure: EXPLORATORY LAPAROTOMY; LYSIS OF ADHESIONS;  SMALL BOWEL RESECTION; WOUND VAC PLACEMENT;  Surgeon: Donnie Mesa, MD;  Location: [facility];  Service: General;  Laterality: N/A;   Family History  Problem Relation Age of Onset   Diabetes Mother    Cancer Mother        Stomach   Diabetes Father    Kidney disease Father    Cancer Sister        Stomach   Stroke Brother    Cancer Brother        Stomach   Mental illness Sister    Cancer Daughter        skin   Social History   Socioeconomic History   Marital status: Legally Separated    Spouse name: Not on file   Number of children: Not on file   Years of education: Not  on file   Highest education level: Not on file  Occupational History   Not on file  Tobacco Use   Smoking status: Every Day    Packs/day: 0.25    Types: Cigarettes    Last attempt to quit: 05/25/2006    Years since quitting: 16.2   Smokeless tobacco: Never   Tobacco comments:    Overall >20 pack year hx and currently smokes 1/4 pack per day.   Vaping Use   Vaping Use: Never used  Substance and Sexual Activity   Alcohol use: Yes    Alcohol/week: 0.0 standard drinks of alcohol    Comment: Socially.   Drug use: Yes    Frequency: 1.0 times per week    Types: Marijuana    Comment: "Helps calm me down"   Sexual activity: Yes    Birth control/protection: Condom  Other Topics Concern   Not on file  Social History Narrative   Current Social History 02/19/2019        Patient lives with family (Daughter, son-in-law, and son) in a home which is 1 story. There are not steps up to the entrance the patient uses. There is a ramp.      Patient's method of transportation is personal car.      The highest level of education was high school diploma.      The patient currently disabled.      Identified important Relationships are "My Family"       Pets : 7 pit bulls       Interests / Fun: Watching movies, playing cards       Current Stressors: "My children, I worry about their health issues."       Religious / Personal Beliefs: "Baptist, I believe in God."       L. Silvano Rusk, RN, BSN    Social Determinants of Health   Financial Resource Strain: Not on file  Food Insecurity: No Food Insecurity (09/06/2022)   Hunger Vital Sign    Worried About Running Out of Food in the Last Year: Never true    [place] in the Last Year: Never true  Transportation Needs: No Transportation Needs (09/06/2022)   PRAPARE - Hydrologist (Medical): No    Lack of Transportation (Non-Medical): No  Physical Activity: Inactive (09/06/2022)   Exercise Vital Sign    Days of  Exercise per Week: 0 days    Minutes of Exercise per Session: 0 min  Stress: Not on file  Social Connections: Not on file    Tobacco Counseling Ready to quit: No Counseling given: Yes Tobacco comments: Overall >20 pack year  hx and currently smokes 1/4 pack per day.    Clinical Intake:  Pre-visit preparation completed: Yes  Pain : Faces Faces Pain Scale: Hurts a little bit Pain Type: Acute pain Pain Location: Abdomen Pain Orientation: Lower Pain Frequency: Intermittent  Faces Pain Scale: Hurts a little bit  Nutritional Risks: None Diabetes: No  How often do you need to have someone help you when you read instructions, pamphlets, or other written materials from your doctor or pharmacy?: 1 - Never  Diabetic?no   Interpreter Needed?: No  Information entered by :: [email or web address]   Activities of Daily Living    09/06/2022    1:57 PM 09/06/2022    1:44 PM  In your present state of health, do you have any difficulty performing the following activities:  Hearing? 0 0  Vision? 0 0  Difficulty concentrating or making decisions? 1 0  Walking or climbing stairs? 1 1  Dressing or bathing? 0 0  Doing errands, shopping? 1 1    Patient Care Team: Idamae Schuller, MD as PCP - General  Indicate any recent Medical Services you may have received from other than [facility] providers in the past year (date may be approximate).     Assessment:   This is a routine wellness examination for [place].  Hearing/Vision screen No results found.  Dietary issues and exercise activities discussed:     Goals Addressed   None   Depression Screen    09/06/2022    2:47 PM 04/12/2022    3:03 PM 11/02/2021    2:52 PM 05/13/2021   11:38 AM 11/25/2019    1:55 PM 05/08/2019    4:39 PM 02/19/2019    1:43 PM  PHQ 2/9 Scores  PHQ - 2 Score '3 5 2 6 4 5 5  '$ PHQ- 9 Score '17 20 7 19 13 18 13    '$ Fall Risk    09/06/2022    1:57 PM 09/06/2022    1:44 PM 04/12/2022    2:14 PM 11/02/2021    2:51 PM  05/13/2021   10:43 AM  Fall Risk   Falls in the past year? '1 1 1 1 1  '$ Number falls in past yr: 1 1 0 0 0  Comment     TRIPPED OVER SOMETHING WITH GROCERIES  Injury with Fall? 0 0 0 0   Risk for fall due to : History of fall(s);Impaired balance/gait History of fall(s);Impaired balance/gait Impaired balance/gait  No Fall Risks  Follow up Falls evaluation completed Falls evaluation completed Falls evaluation completed;Falls prevention discussed Falls evaluation completed Falls evaluation completed;Education provided    FALL RISK PREVENTION PERTAINING TO THE HOME:  Any stairs in or around the home? No  had ramp built  If so, are there any without handrails? No  Home free of loose throw rugs in walkways, pet beds, electrical cords, etc? Yes  Adequate lighting in your home to reduce risk of falls? Yes   ASSISTIVE DEVICES UTILIZED TO PREVENT FALLS:  Life alert? No  Use of a cane, walker or w/c? Yes  Grab bars in the bathroom? No  Shower chair or bench in shower? Yes  Elevated toilet seat or a handicapped toilet? Yes   TIMED UP AND GO:  Was the test performed? No .  Length of time to ambulate 10 feet: 0 sec.   Gait unsteady without use of assistive device, provider informed and interventions were implemented  Cognitive Function:        09/06/2022    1:45  PM  6CIT Screen  What Year? 0 points  What month? 0 points  What time? 0 points  Count back from 20 0 points  Months in reverse 0 points  Repeat phrase 0 points  Total Score 0 points    Immunizations Immunization History  Administered Date(s) Administered   Fluad Quad(high Dose 65+) 04/12/2022   Influenza Split 05/24/2011, 05/29/2012   Influenza Whole 06/06/2007, 05/09/2008, 04/20/2009, 03/23/2010   Influenza,inj,Quad PF,6+ Mos 04/01/2013, 09/10/2014, 06/03/2015, 04/13/2016, 03/29/2017, 03/27/2019   Influenza-Unspecified 05/13/2021   PFIZER(Purple Top)SARS-COV-2 Vaccination 10/05/2019, 11/08/2019   PNEUMOCOCCAL  CONJUGATE-20 11/02/2021   Td 11/08/2010   Tdap 11/02/2021    TDAP status: Up to date  Flu Vaccine status: Up to date  Pneumococcal vaccine status: Up to date  Covid-19 vaccine status: Completed vaccines  Qualifies for Shingles Vaccine? Yes   Zostavax completed  unknown   Shingrix Completed?: No.    Education has been provided regarding the importance of this vaccine. Patient has been advised to call insurance company to determine out of pocket expense if they have not yet received this vaccine. Advised may also receive vaccine at local pharmacy or Health Dept. Verbalized acceptance and understanding.  Screening Tests Health Maintenance  Topic Date Due   Zoster Vaccines- Shingrix (1 of 2) Never done   MAMMOGRAM  08/28/2014   DEXA SCAN  Never done   COVID-19 Vaccine (3 - 2023-24 season) 03/25/2022   Medicare Annual Wellness (AWV)  09/07/2023   DTaP/Tdap/Td (3 - Td or Tdap) 11/03/2031   COLONOSCOPY (Pts 45-87yr Insurance coverage will need to be confirmed)  05/19/2032   Pneumonia Vaccine 68 Years old  Completed   INFLUENZA VACCINE  Completed   Hepatitis C Screening  Completed   HPV VACCINES  Aged Out    Health Maintenance  Health Maintenance Due  Topic Date Due   Zoster Vaccines- Shingrix (1 of 2) Never done   MAMMOGRAM  08/28/2014   DEXA SCAN  Never done   COVID-19 Vaccine (3 - 2023-24 season) 03/25/2022    Colorectal cancer screening: Type of screening: Colonoscopy. Completed 05/19/2022. Repeat every 10 years  Mammogram status: Completed 08/28/2012. Repeat every year-order was placed 04/12/2022   Lung Cancer Screening: (Low Dose CT Chest recommended if Age 68-80years, 30 pack-year currently smoking OR have quit w/in 15years.) does qualify.   Lung Cancer Screening Referral: defer to pcp  Additional Screening:  Hepatitis C Screening: does not qualify; Completed 02/26/2018  Vision Screening: Recommended annual ophthalmology exams for early detection of glaucoma  and other disorders of the eye. Is the patient up to date with their annual eye exam?  No  Who is the provider or what is the name of the office in which the patient attends annual eye exams? [facility] If pt is not established with a provider, would they like to be referred to a provider to establish care? No .   Dental Screening: Recommended annual dental exams for proper oral hygiene  Community Resource Referral / Chronic Care Management: CRR required this visit?  No   CCM required this visit?  No      Plan:     I have personally reviewed and noted the following in the patient's chart:   Medical and social history Use of alcohol, tobacco or illicit drugs  Current medications and supplements including opioid prescriptions. Patient is not currently taking opioid prescriptions. Functional ability and status Nutritional status Physical activity Advanced directives List of other physicians Hospitalizations, surgeries,  and ER visits in previous 12 months Vitals Screenings to include cognitive, depression, and falls Referrals and appointments  In addition, I have reviewed and discussed with patient certain preventive protocols, quality metrics, and best practice recommendations. A written personalized care plan for preventive services as well as general preventive health recommendations were provided to patient.     Nicoletta Dress, [place]   09/06/2022   Nurse Notes: face to face   Ms. Tam , Thank you for taking time to come for your Medicare Wellness Visit. I appreciate your ongoing commitment to your health goals. Please review the following plan we discussed and let me know if I can assist you in the future.   These are the goals we discussed:  Goals       Eat breakfast      Weight (lb) < 169 lb (76.7 kg) (pt-stated)      7% weight loss        This is a list of the screening recommended for you and due dates:  Health Maintenance  Topic Date  Due   Zoster (Shingles) Vaccine (1 of 2) Never done   Mammogram  08/28/2014   DEXA scan (bone density measurement)  Never done   COVID-19 Vaccine (3 - 2023-24 season) 03/25/2022   Medicare Annual Wellness Visit  09/07/2023   DTaP/Tdap/Td vaccine (3 - Td or Tdap) 11/03/2031   Colon Cancer Screening  05/19/2032   Pneumonia Vaccine  Completed   Flu Shot  Completed   Hepatitis C Screening: USPSTF Recommendation to screen - Ages 18-79 yo.  Completed   HPV Vaccine  Aged Out

## 2022-09-07 ENCOUNTER — Telehealth: Payer: Self-pay | Admitting: *Deleted

## 2022-09-07 LAB — LIPID PANEL
Chol/HDL Ratio: 2.2 ratio (ref 0.0–4.4)
Cholesterol, Total: 175 mg/dL (ref 100–199)
HDL: 78 mg/dL (ref >39)
LDL Chol Calc (NIH): 77 mg/dL (ref 0–99)
Triglycerides: 112 mg/dL (ref 0–149)
VLDL Cholesterol Cal: 20 mg/dL (ref 5–40)

## 2022-09-07 LAB — MAGNESIUM: Magnesium: 1.7 mg/dL (ref 1.6–2.3)

## 2022-09-07 LAB — CBC
Hematocrit: 36.3 % (ref 34.0–46.6)
Hemoglobin: 12.2 g/dL (ref 11.1–15.9)
MCH: 30.3 pg (ref 26.6–33.0)
MCHC: 33.6 g/dL (ref 31.5–35.7)
MCV: 90 fL (ref 79–97)
Platelets: 213 10*3/uL (ref 150–450)
RBC: 4.03 x10E6/uL (ref 3.77–5.28)
RDW: 13 % (ref 11.7–15.4)
WBC: 2.5 10*3/uL — CL (ref 3.4–10.8)

## 2022-09-07 LAB — BMP8+ANION GAP
Anion Gap: 17 mmol/L (ref 10.0–18.0)
BUN/Creatinine Ratio: 11 — ABNORMAL LOW (ref 12–28)
BUN: 9 mg/dL (ref 8–27)
CO2: 25 mmol/L (ref 20–29)
Calcium: 9.2 mg/dL (ref 8.7–10.3)
Chloride: 101 mmol/L (ref 96–106)
Creatinine, Ser: 0.85 mg/dL (ref 0.57–1.00)
Glucose: 109 mg/dL — ABNORMAL HIGH (ref 70–99)
Potassium: 3.9 mmol/L (ref 3.5–5.2)
Sodium: 143 mmol/L (ref 134–144)
eGFR: 75 mL/min/{1.73_m2} (ref >59)

## 2022-09-07 NOTE — Telephone Encounter (Signed)
Received fax from [place] with an Alert on WBC of 2.5. PCP notified at [place].

## 2022-09-08 DIAGNOSIS — R7303 Prediabetes: Secondary | ICD-10-CM | POA: Insufficient documentation

## 2022-09-08 DIAGNOSIS — D72819 Decreased white blood cell count, unspecified: Secondary | ICD-10-CM | POA: Insufficient documentation

## 2022-09-08 NOTE — Assessment & Plan Note (Signed)
Patient continues to smoke about 1/4 ppd. Advised on cessation.

## 2022-09-08 NOTE — Assessment & Plan Note (Signed)
Pt is doing well. States has a good appetite, no abdominal pain and good bowel movements.

## 2022-09-08 NOTE — Assessment & Plan Note (Signed)
Crestor 20 mg qd. Lipid panel on 03/2022 showed chol 272 LDL 175. Repeat lipid panel shows improvement to 77 which is at goal. Will continue Crestor 20 mg qd.

## 2022-09-08 NOTE — Assessment & Plan Note (Signed)
WBC count at 2.5. Pt has hx of leukopenia but not this low. Will get CBC with differential and a pathologist smear.

## 2022-09-08 NOTE — Assessment & Plan Note (Signed)
Low dose CT scan ordered. Advised pt on the importance of this.

## 2022-09-08 NOTE — Assessment & Plan Note (Addendum)
Patient has MDD. Last seen them one month ago. She is on Prozac 80 mg qd. Her PHQ 9 score is 17 but she reports doing well.

## 2022-09-08 NOTE — Assessment & Plan Note (Signed)
Pt has prediabetes with A1c of 6.0 in 07/15/22. Will repeat in 6 months.

## 2022-09-08 NOTE — Assessment & Plan Note (Signed)
Well controlled. Prilosec 40 mg qd.

## 2022-09-09 ENCOUNTER — Encounter: Payer: Self-pay | Admitting: Internal Medicine

## 2022-09-12 ENCOUNTER — Other Ambulatory Visit: Payer: Self-pay | Admitting: Internal Medicine

## 2022-09-12 DIAGNOSIS — D72819 Decreased white blood cell count, unspecified: Secondary | ICD-10-CM

## 2022-09-12 NOTE — Progress Notes (Signed)
Internal Medicine Clinic Attending  Case discussed with Dr. Khan  at the time of the visit.  We reviewed the resident's history and exam and pertinent patient test results.  I agree with the assessment, diagnosis, and plan of care documented in the resident's note.  

## 2022-09-30 ENCOUNTER — Ambulatory Visit (HOSPITAL_COMMUNITY)
Admission: RE | Admit: 2022-09-30 | Discharge: 2022-09-30 | Disposition: A | Discharge status: Home / Self Care | Payer: 59 | Source: Ambulatory Visit | Attending: Internal Medicine | Admitting: Internal Medicine

## 2022-09-30 DIAGNOSIS — I700 Atherosclerosis of aorta: Secondary | ICD-10-CM | POA: Diagnosis not present

## 2022-09-30 DIAGNOSIS — Z87891 Personal history of nicotine dependence: Secondary | ICD-10-CM | POA: Insufficient documentation

## 2022-09-30 DIAGNOSIS — J432 Centrilobular emphysema: Secondary | ICD-10-CM | POA: Insufficient documentation

## 2022-09-30 DIAGNOSIS — F17200 Nicotine dependence, unspecified, uncomplicated: Secondary | ICD-10-CM

## 2022-09-30 DIAGNOSIS — Z122 Encounter for screening for malignant neoplasm of respiratory organs: Secondary | ICD-10-CM | POA: Diagnosis present

## 2022-10-31 NOTE — Progress Notes (Signed)
I reviewed the AWV findings with the provider who conducted the visit. I was present in the office suite and immediately available to provide assistance and direction throughout the time the service was provided.  

## 2022-12-22 ENCOUNTER — Other Ambulatory Visit: Payer: Self-pay

## 2022-12-22 DIAGNOSIS — K219 Gastro-esophageal reflux disease without esophagitis: Secondary | ICD-10-CM

## 2022-12-23 MED ORDER — OMEPRAZOLE 40 MG PO CPDR: DELAYED_RELEASE_CAPSULE | ORAL | 2 refills | Status: DC

## 2023-01-18 ENCOUNTER — Other Ambulatory Visit: Payer: Self-pay | Admitting: Student

## 2023-01-18 DIAGNOSIS — K219 Gastro-esophageal reflux disease without esophagitis: Secondary | ICD-10-CM

## 2023-06-10 ENCOUNTER — Other Ambulatory Visit: Payer: Self-pay | Admitting: Internal Medicine

## 2023-06-10 DIAGNOSIS — K219 Gastro-esophageal reflux disease without esophagitis: Secondary | ICD-10-CM

## 2023-09-28 ENCOUNTER — Other Ambulatory Visit: Payer: Self-pay

## 2023-09-28 DIAGNOSIS — M5416 Radiculopathy, lumbar region: Secondary | ICD-10-CM

## 2023-09-28 DIAGNOSIS — E785 Hyperlipidemia, unspecified: Secondary | ICD-10-CM

## 2023-09-28 NOTE — Telephone Encounter (Signed)
 Patient last seen 09/06/22, I called the patient to schedule a follow up appointment in order for [place] to continue to refill her medications, unable to reach her or lvm.

## 2023-09-29 MED ORDER — GABAPENTIN 300 MG PO CAPS: 600.0000 mg | ORAL_CAPSULE | Freq: Three times a day (TID) | ORAL | 11 refills | Status: DC

## 2023-09-29 MED ORDER — ROSUVASTATIN CALCIUM 20 MG PO TABS: 20.0000 mg | ORAL_TABLET | Freq: Every day | ORAL | 11 refills | Status: DC

## 2023-10-12 ENCOUNTER — Ambulatory Visit: Payer: Self-pay | Admitting: Internal Medicine

## 2023-10-12 NOTE — Telephone Encounter (Signed)
  Chief Complaint: left hip pain  Symptoms: pain  Frequency: constant   Disposition: [] ED /[x] Urgent Care (no appt availability in office) / [] Appointment(In office/virtual)/ []  [facility]/ [] Home Care/ [] Refused Recommended Disposition /[] [facility]/ []  Follow-up with PCP Additional Notes: Pt calling with left hip pain that started 3-4 days ago. Pt rated pain 10/10. There has been no injury and pt not sure why she has pain. Pt stated the gabapentin isn't helping but hasn't tried any OTC pain medication. No internal med appts  and Pt not willing  to see another [facility] provider today due to transportation issues. RN advised to be seen today and suggested Urgent Care since no appt required. RN gave care advice and pt verbalized understanding. Pt stated she "would try to go."             Copied from CRM [id number]. Topic: Clinical - Red Word Triage >> Oct 12, 2023  1:36 PM Alvino Blood C wrote: Red Word that prompted transfer to Nurse Triage: Patient is having  sharp pain on her left side she describes it as a 10. She has been in pain for he last 3-4 days Reason for Disposition  [1] SEVERE pain (e.g., excruciating, unable to do any normal activities) AND [2] not improved after 2 hours of pain medicine  Answer Assessment - Initial Assessment Questions 1. LOCATION and RADIATION: "Where is the pain located?"      Left side  2. QUALITY: "What does the pain feel like?"  (e.g., sharp, dull, aching, burning)     Aching  3. SEVERITY: "How bad is the pain?" "What does it keep you from doing?"   (Scale 1-10; or mild, moderate, severe)   -  MILD (1-3): doesn't interfere with normal activities    -  MODERATE (4-7): interferes with normal activities (e.g., work or school) or awakens from sleep, limping    -  SEVERE (8-10): excruciating pain, unable to do any normal activities, unable to walk     10 4. ONSET: "When did the pain start?" "Does it come and go, or is it there all the  time?"     3 days ago   6. CAUSE: "What do you think is causing the hip pain?"      Not sure  7. AGGRAVATING FACTORS: "What makes the hip pain worse?" (e.g., walking, climbing stairs, running)     Any movement  8. OTHER SYMPTOMS: "Do you have any other symptoms?" (e.g., back pain, pain shooting down leg,  fever, rash)     Denies  Protocols used: Hip Pain-A-AH

## 2023-10-13 ENCOUNTER — Ambulatory Visit: Admitting: Student

## 2023-10-13 ENCOUNTER — Encounter: Payer: Self-pay | Admitting: Student

## 2023-10-13 VITALS — BP 126/77 | HR 65 | Ht 66.0 in | Wt 143.5 lb

## 2023-10-13 DIAGNOSIS — Z780 Asymptomatic menopausal state: Secondary | ICD-10-CM | POA: Diagnosis not present

## 2023-10-13 DIAGNOSIS — Z23 Encounter for immunization: Secondary | ICD-10-CM | POA: Diagnosis not present

## 2023-10-13 DIAGNOSIS — F331 Major depressive disorder, recurrent, moderate: Secondary | ICD-10-CM

## 2023-10-13 DIAGNOSIS — M545 Low back pain, unspecified: Secondary | ICD-10-CM | POA: Diagnosis not present

## 2023-10-13 DIAGNOSIS — E785 Hyperlipidemia, unspecified: Secondary | ICD-10-CM

## 2023-10-13 DIAGNOSIS — Z1231 Encounter for screening mammogram for malignant neoplasm of breast: Secondary | ICD-10-CM

## 2023-10-13 DIAGNOSIS — R109 Unspecified abdominal pain: Secondary | ICD-10-CM | POA: Insufficient documentation

## 2023-10-13 DIAGNOSIS — R1010 Upper abdominal pain, unspecified: Secondary | ICD-10-CM

## 2023-10-13 MED ORDER — FLUOXETINE HCL 40 MG PO CAPS: 80.0000 mg | ORAL_CAPSULE | Freq: Every day | ORAL | 3 refills | Status: AC

## 2023-10-13 MED ORDER — ROSUVASTATIN CALCIUM 20 MG PO TABS: 20.0000 mg | ORAL_TABLET | Freq: Every day | ORAL | 11 refills | Status: AC

## 2023-10-13 MED ORDER — BUPROPION HCL ER (SR) 100 MG PO TB12
100.0000 mg | ORAL_TABLET | Freq: Two times a day (BID) | ORAL | 2 refills | Status: DC
Start: 2023-10-13 — End: 2023-11-22

## 2023-10-13 MED ORDER — OXYCODONE HCL 5 MG PO TABS: 5.0000 mg | ORAL_TABLET | ORAL | 0 refills | Status: AC | PRN

## 2023-10-13 NOTE — Assessment & Plan Note (Signed)
 Acute visit for a 1 week history of waxing and waning left flank pain.  At times it is quite intense, including now, which she rates as a 10 out of 10, although does not always this intense.  Insidious onset, no trauma falls etc.  She just woke up with it 1 morning it started getting worse.  She denies abdominal pain upper back pain, nausea or vomiting, dysuria, changes in bowel, skin changes.  She does have a history of chronic low back pain and she takes gabapentin for this, although she is quite sure that this is a different pain.  Her pain does not radiate to the lower extremities.  On exam she has CVA tenderness.  Primarily I suspect that she has a stone.  I will collect a UA and a CT.  Oxycodone for pain.  If larger than 1 cm, would refer to urology.  Otherwise we will probably start Flomax. - Oxycodone 5 every 4 as needed for 5 days - CT renal study - UA

## 2023-10-13 NOTE — Progress Notes (Signed)
 CC: Acute L flank pain  HPI:  [name] is a 68 y.o. female with a PMH stated below who presents today for evaluation of pain that started 1 week ago without clear cause, waxes and wanes, is localized and without systemic symptoms.  Please see problem based assessment and plan for additional details.  Past Medical History:  Diagnosis Date   Back pain 07/07/2009   Bilateral knee pain 05/29/2012   Standing films did not reveal much joint space loss so I wonder if most of her arthritic changes are in the patellofemoral joint. She has received significant relief from corticosteroid injection. If we need to further investigate this would recommend MRI of the knee. Right knee seems worse than left.   Bradycardia 05/15/2006   Bursitis of right shoulder 05/10/2010   Depression 05/15/2006   Ecchymoses, spontaneous 08/12/2010   GERD (gastroesophageal reflux disease) 05/15/2006   Hypothermia 07/13/2022   Inguinal pain 03/23/2010   right   Intertrigo 03/04/2014   Left hip pain 11/25/2019   Meralgia paresthetica of right side 05/08/2019   Personal history of goiter 05/15/2006   Right hand pain 11/25/2020   Shoulder pain, right 07/07/2009   Sinus bradycardia 11/08/2010   Ongoing for many years, HR is usually 45-55. Workup including 2D-Echo negative.    Smoker 05/10/2010   Tendinopathy of left rotator cuff 09/09/2016    Review of Systems: ROS negative except for what is noted on the assessment and plan.  Vitals:   10/13/23 1001  BP: 126/77  Pulse: 65  SpO2: 97%  Weight: 143 lb 8 oz (65.1 kg)  Height: 5\' 6"  (1.676 m)    Physical Exam: Constitutional: healthy woman in pain but not acute distress. She presented in transport chair due to pain. HENT: normocephalic atraumatic, mucous membranes moist Eyes: conjunctiva non-erythematous Cardiovascular: regular rate and rhythm, no m/r/g Pulmonary/Chest: normal work of breathing on room air, lungs clear to auscultation  bilaterally Abdominal: soft, non-tender, non-distended MSK: normal bulk and tone. CVA tenderness on L but not R. No upper back or leg pain. Neurological: alert & oriented x 3, no focal deficit Skin: warm and dry Psych: normal mood and behavior  Assessment & Plan:   Patient discussed with Dr. Cleda Daub  Acute left flank pain Acute visit for a 1 week history of waxing and waning left flank pain.  At times it is quite intense, including now, which she rates as a 10 out of 10, although does not always this intense.  Insidious onset, no trauma falls etc.  She just woke up with it 1 morning it started getting worse.  She denies abdominal pain upper back pain, nausea or vomiting, dysuria, changes in bowel, skin changes.  She does have a history of chronic low back pain and she takes gabapentin for this, although she is quite sure that this is a different pain.  Her pain does not radiate to the lower extremities.  On exam she has CVA tenderness.  Primarily I suspect that she has a stone.  I will collect a UA and a CT.  Oxycodone for pain.  If larger than 1 cm, would refer to urology.  Otherwise we will probably start Flomax. - Oxycodone 5 every 4 as needed for 5 days - CT renal study - UA  MDD (major depressive disorder) PHQ was 21 today.  She is on Prozac 80 mg daily.  She does report continued depressive symptoms but not severe anxiety.  No SI. - Continue Prozac  80 daily - Start Wellbutrin 100 twice daily  HLD (hyperlipidemia) Refill rosuvastatin 20 daily  RTC 1 month for follow up pain and depression or sooner if needed.  Katheran James, D.O. [facility] Internal Medicine, PGY-1 Phone: [phone] Date 10/13/2023 Time 11:34 AM

## 2023-10-13 NOTE — Assessment & Plan Note (Signed)
 PHQ was 21 today.  She is on Prozac 80 mg daily.  She does report continued depressive symptoms but not severe anxiety.  No SI. - Continue Prozac 80 daily - Start Wellbutrin 100 twice daily

## 2023-10-13 NOTE — Assessment & Plan Note (Signed)
 Refill rosuvastatin 20 daily

## 2023-10-13 NOTE — Patient Instructions (Signed)
 I suspect you have a kidney stone.  Today I would like to collect a urine sample and get a CT of your abdomen.  I will give you oxycodone for pain.  I will call you if any other tests or medicines are necessary.

## 2023-10-14 LAB — URINALYSIS, ROUTINE W REFLEX MICROSCOPIC
Bilirubin, UA: NEGATIVE
Glucose, UA: NEGATIVE
Ketones, UA: NEGATIVE
Nitrite, UA: NEGATIVE
Protein,UA: NEGATIVE
RBC, UA: NEGATIVE
Specific Gravity, UA: 1.014 (ref 1.005–1.030)
Urobilinogen, Ur: 1 mg/dL (ref 0.2–1.0)
pH, UA: 5.5 (ref 5.0–7.5)

## 2023-10-14 LAB — MICROSCOPIC EXAMINATION
Casts: NONE SEEN /LPF
Epithelial Cells (non renal): >10 /HPF — AB (ref 0–10)
RBC, Urine: NONE SEEN /HPF (ref 0–2)

## 2023-10-17 ENCOUNTER — Ambulatory Visit (HOSPITAL_COMMUNITY)

## 2023-10-24 ENCOUNTER — Telehealth: Payer: Self-pay | Admitting: *Deleted

## 2023-10-24 NOTE — Telephone Encounter (Signed)
 ammogram appointment-April 29.2025 @10 :20 am [facility] 10:00 am /[facility]ex[facility] -December 5.2025 @ 12:00 noon-arrive 11:45 am -[facility] [phone]. Unable to contact patient by phone, voice mail not yet set up. Appointment has been mail attached with information regarding the 75.00 no show fee.

## 2023-10-25 ENCOUNTER — Telehealth: Payer: Self-pay | Admitting: *Deleted

## 2023-10-25 NOTE — Progress Notes (Signed)
 Internal Medicine Clinic Attending  Case discussed with the resident at the time of the visit.  We reviewed the resident's history and exam and pertinent patient test results.  I agree with the assessment, diagnosis, and plan of care documented in the resident's note.

## 2023-10-25 NOTE — Addendum Note (Signed)
 Addended by: Gust Rung on: 10/25/2023 03:34 PM   Modules accepted: Level of Service

## 2023-10-25 NOTE — Telephone Encounter (Signed)
 Mammogram appointment scheduled - 11-21-2023 -already scheduled Dexa scan appointment  06-28-2024 -  already scheduled.

## 2023-11-13 ENCOUNTER — Encounter: Admitting: Student

## 2023-11-20 DIAGNOSIS — R634 Abnormal weight loss: Secondary | ICD-10-CM | POA: Insufficient documentation

## 2023-11-20 DIAGNOSIS — Z860100 Personal history of colon polyps, unspecified: Secondary | ICD-10-CM | POA: Insufficient documentation

## 2023-11-20 DIAGNOSIS — R1310 Dysphagia, unspecified: Secondary | ICD-10-CM | POA: Insufficient documentation

## 2023-11-20 DIAGNOSIS — R110 Nausea: Secondary | ICD-10-CM | POA: Insufficient documentation

## 2023-11-21 ENCOUNTER — Ambulatory Visit

## 2023-11-22 ENCOUNTER — Ambulatory Visit: Payer: Self-pay | Admitting: Student

## 2023-11-22 VITALS — BP 98/55 | HR 54 | Ht 66.0 in | Wt 140.4 lb

## 2023-11-22 DIAGNOSIS — F17210 Nicotine dependence, cigarettes, uncomplicated: Secondary | ICD-10-CM | POA: Diagnosis not present

## 2023-11-22 DIAGNOSIS — F331 Major depressive disorder, recurrent, moderate: Secondary | ICD-10-CM | POA: Diagnosis present

## 2023-11-22 DIAGNOSIS — Z720 Tobacco use: Secondary | ICD-10-CM

## 2023-11-22 DIAGNOSIS — K219 Gastro-esophageal reflux disease without esophagitis: Secondary | ICD-10-CM | POA: Diagnosis not present

## 2023-11-22 DIAGNOSIS — M545 Low back pain, unspecified: Secondary | ICD-10-CM

## 2023-11-22 MED ORDER — ACETAMINOPHEN 500 MG PO TABS: 1000.0000 mg | ORAL_TABLET | Freq: Three times a day (TID) | ORAL | 2 refills | Status: AC | PRN

## 2023-11-22 MED ORDER — OMEPRAZOLE 40 MG PO CPDR: DELAYED_RELEASE_CAPSULE | ORAL | 2 refills | Status: AC

## 2023-11-22 MED ORDER — BUPROPION HCL ER (SR) 100 MG PO TB12: 100.0000 mg | ORAL_TABLET | Freq: Every day | ORAL | 11 refills | Status: AC

## 2023-11-22 NOTE — Progress Notes (Signed)
 Subjective:  CC: depression follow up  HPI:  [name] is a 68 y.o. female with a past medical history stated below and presents today for depression follow up. Please see problem based assessment and plan for additional details.  Past Medical History:  Diagnosis Date   Back pain 07/07/2009   Bilateral knee pain 05/29/2012   Standing films did not reveal much joint space loss so I wonder if most of her arthritic changes are in the patellofemoral joint. She has received significant relief from corticosteroid injection. If we need to further investigate this would recommend MRI of the knee. Right knee seems worse than left.   Bradycardia 05/15/2006   Bursitis of right shoulder 05/10/2010   Depression 05/15/2006   Ecchymoses, spontaneous 08/12/2010   GERD (gastroesophageal reflux disease) 05/15/2006   Hypothermia 07/13/2022   Inguinal pain 03/23/2010   right   Intertrigo 03/04/2014   Left hip pain 11/25/2019   Meralgia paresthetica of right side 05/08/2019   Personal history of goiter 05/15/2006   Right hand pain 11/25/2020   Shoulder pain, right 07/07/2009   Sinus bradycardia 11/08/2010   Ongoing for many years, HR is usually 45-55. Workup including 2D-Echo negative.    Smoker 05/10/2010   Tendinopathy of left rotator cuff 09/09/2016    Current Outpatient Medications on File Prior to Visit  Medication Sig Dispense Refill   FLUoxetine  (PROZAC ) 40 MG capsule Take 2 capsules (80 mg total) by mouth daily. 180 capsule 3   gabapentin  (NEURONTIN ) 300 MG capsule Take 2 capsules (600 mg total) by mouth 3 (three) times daily. TAKE 2 CAPSULES(600 MG) BY MOUTH THREE TIMES DAILY Strength: 300 mg 180 capsule 11   Multiple Vitamin (MULTIVITAMIN WITH MINERALS) TABS Take 1 tablet by mouth daily.     rosuvastatin  (CRESTOR ) 20 MG tablet Take 1 tablet (20 mg total) by mouth daily. 30 tablet 11   No current facility-administered medications on file prior to visit.    Family History   Problem Relation Age of Onset   Diabetes Mother    Cancer Mother        Stomach   Diabetes Father    Kidney disease Father    Cancer Sister        Stomach   Stroke Brother    Cancer Brother        Stomach   Mental illness Sister    Cancer Daughter        skin    Social History   Socioeconomic History   Marital status: Legally Separated    Spouse name: Not on file   Number of children: Not on file   Years of education: Not on file   Highest education level: Not on file  Occupational History   Not on file  Tobacco Use   Smoking status: Every Day    Current packs/day: 0.00    Types: Cigarettes    Last attempt to quit: 05/25/2006    Years since quitting: 17.5   Smokeless tobacco: Never   Tobacco comments:    Overall >20 pack year hx and currently smokes 1/4 pack per day.   Vaping Use   Vaping status: Never Used  Substance and Sexual Activity   Alcohol use: Yes    Alcohol/week: 0.0 standard drinks of alcohol    Comment: Socially.   Drug use: Yes    Frequency: 1.0 times per week    Types: Marijuana    Comment: "Helps calm me down"   Sexual  activity: Yes    Birth control/protection: Condom  Other Topics Concern   Not on file  Social History Narrative   Current Social History 02/19/2019        Patient lives with family (Daughter, son-in-law, and son) in a home which is 1 story. There are not steps up to the entrance the patient uses. There is a ramp.      Patient's method of transportation is personal car.      The highest level of education was high school diploma.      The patient currently disabled.      Identified important Relationships are "My Family"       Pets : 7 pit bulls       Interests / Fun: Watching movies, playing cards       Current Stressors: "My children, I worry about their health issues."       Religious / Personal Beliefs: "Baptist, I believe in God."       L. Corinna Dickens, RN, BSN    Social Drivers of Corporate investment banker  Strain: Not on file  Food Insecurity: No Food Insecurity (09/06/2022)   Hunger Vital Sign    Worried About Running Out of Food in the Last Year: Never true    Ran Out of Food in the Last Year: Never true  Transportation Needs: No Transportation Needs (09/06/2022)   PRAPARE - Administrator, Civil Service (Medical): No    Lack of Transportation (Non-Medical): No  Physical Activity: Inactive (09/06/2022)   Exercise Vital Sign    Days of Exercise per Week: 0 days    Minutes of Exercise per Session: 0 min  Stress: Not on file  Social Connections: Not on file  Intimate Partner Violence: Not At Risk (09/06/2022)   Humiliation, Afraid, Rape, and Kick questionnaire    Fear of Current or Ex-Partner: No    Emotionally Abused: No    Physically Abused: No    Sexually Abused: No    Review of Systems: ROS negative except for what is noted on the assessment and plan.  Objective:   Vitals:   11/22/23 1332 11/22/23 1413  BP: (!) 93/54 (!) 98/55  Pulse: 69 (!) 54  Weight: 140 lb 6.4 oz (63.7 kg)   Height: 5\' 6"  (1.676 m)     Physical Exam: Constitutional: chronically ill appearing woman sitting in exam chair in no acute distress HENT: mucous membranes moist Eyes: conjunctiva non-erythematous Neck: supple Cardiovascular: regular rate and rhythm, no m/r/g Pulmonary/Chest: normal work of breathing on room air, lungs clear to auscultation bilaterally Abdominal: soft, non-tender, non-distended MSK: normal bulk and tone, walking without assistance and without using hands to prop herself up Neurological: alert & oriented x 3, 4/5 strength in bilateral upper and lower extremities, normal sensation throughout Skin: warm and dry Psych: Pleasant mood and affect       11/22/2023    2:04 PM  Depression screen PHQ 2/9  Decreased Interest 1  Down, Depressed, Hopeless 0  PHQ - 2 Score 1  Altered sleeping 0  Tired, decreased energy 0  Change in appetite 1  Feeling bad or failure about  yourself  1  Trouble concentrating 0  Moving slowly or fidgety/restless 0  Suicidal thoughts 0  PHQ-9 Score 3  Difficult doing work/chores Not difficult at all     Assessment & Plan:   GERD Patient's prilosec fell off the refill list. She reports recurrence of symptoms without it. No weight  loss, early satiety with foods, dyspepsia, difficulty with swallowing foods, or odynophagia. - Will refill Omeprazole   MDD (major depressive disorder) Improvement in PHQ9 during this visit. Patient has started to re-start activity with many friends. Reports improvement with energy and communicating others. Denies SI. She is currently on Prozac  80 mg daily. Wellbutrin  100 mg BID was ordered at last visit. She has been taking one 100 mg tablet daily. Since, she has noted that she is smoking less and that her appetite has waned. She schedules her meals but is concerned about losing weight or losing her appetite completely.  Overall, patient seems to be doing much better on this therapy. I agree with her that Bupropion  has decreased her appetite and she has low reserve, I advised her to continue the Bupropion  at 100 mg daily and to not increase to the BID dosing.  - continue to monitor at outpatient visits  Tobacco abuse Patient has decreased the number of cigarettes to 1-2 cigarettes per day. - Continue Bupropion  100 mg daily  Lumbar back pain History of lumbar pain that has flared in the past 3 days. Has found some relief with voltaren  gel. Had previously been on Gabapentin  but this has not been refilled since 2023. Gabapentin  was initially prescribed for peripheral neuropathy but this has been stable and she reports has been okay without medication.   Discussed trial of extra strength Tylenol   for this flare in addition to voltaren  gel. Would hesitate to restart gabapentin  as this would add to the number of centrally acting meds in this person with borderline low blood pressure and at risk for falls.    Return in about 4 weeks (around 12/20/2023) for Medication follow up and blood pressure.  Patient discussed with Dr. Warden Ha Garald Jumbo, MD [facility]  11/24/2023, 6:34 PM

## 2023-11-22 NOTE — Patient Instructions (Signed)
 Thank you, Ms.Sharlette Dayhoff for allowing us  to provide your care today. Today we discussed your lumbar pain, your blood pressure, your eating habits, smoking cessation plan, and your current depression management.    I would like you to take note of your blood pressure and potential dizziness symptoms. You are on two medications that could affect your balance and it is important to remember:  - Remove or anchor rugs.   - Minimize clutter around your house - Remove loose wires.   - Use nonskid mats.   - Install handrails in bathrooms, halls, and long stairways.   - Light hallways, stairwells, and entrances.  - Wear sturdy, low-heeled shoes.  - Weight-bearing exercise ( walking) at least 30 minutes each day.   I would like you to return in one month to discuss your pain further as well as do other preventative care like screening for diabetes and monitor your blood pressure.  Please follow-up in: one month    We look forward to seeing you next time. Please call our clinic at [phone] if you have any questions or concerns. The best time to call is Monday-Friday from 9am-4pm, but there is someone available 24/7. If after hours or the weekend, call the main hospital number and ask for the Internal Medicine Resident On-Call. If you need medication refills, please notify your pharmacy one week in advance and they will send us  a request.   Thank you for letting us  take part in your care. Wishing you the best!  Cathey Clunes, MD 11/22/2023, 2:26 PM [facility]

## 2023-11-24 ENCOUNTER — Ambulatory Visit

## 2023-11-24 ENCOUNTER — Encounter: Payer: Self-pay | Admitting: Student

## 2023-11-24 NOTE — Assessment & Plan Note (Signed)
 History of lumbar pain that has flared in the past 3 days. Has found some relief with voltaren  gel. Had previously been on Gabapentin  but this has not been refilled since 2023. Gabapentin  was initially prescribed for peripheral neuropathy but this has been stable and she reports has been okay without medication.   Discussed trial of extra strength Tylenol   for this flare in addition to voltaren  gel. Would hesitate to restart gabapentin  as this would add to the number of centrally acting meds in this person with borderline low blood pressure and at risk for falls.

## 2023-11-24 NOTE — Assessment & Plan Note (Signed)
 Improvement in PHQ9 during this visit. Patient has started to re-start activity with many friends. Reports improvement with energy and communicating others. Denies SI. She is currently on Prozac  80 mg daily. Wellbutrin  100 mg BID was ordered at last visit. She has been taking one 100 mg tablet daily. Since, she has noted that she is smoking less and that her appetite has waned. She schedules her meals but is concerned about losing weight or losing her appetite completely.  Overall, patient seems to be doing much better on this therapy. I agree with her that Bupropion  has decreased her appetite and she has low reserve, I advised her to continue the Bupropion  at 100 mg daily and to not increase to the BID dosing.  - continue to monitor at outpatient visits

## 2023-11-24 NOTE — Assessment & Plan Note (Signed)
 Patient's prilosec fell off the refill list. She reports recurrence of symptoms without it. No weight loss, early satiety with foods, dyspepsia, difficulty with swallowing foods, or odynophagia. - Will refill Omeprazole 

## 2023-11-24 NOTE — Assessment & Plan Note (Signed)
 Patient has decreased the number of cigarettes to 1-2 cigarettes per day. - Continue Bupropion  100 mg daily

## 2023-11-27 ENCOUNTER — Telehealth: Payer: Self-pay | Admitting: *Deleted

## 2023-11-27 NOTE — Telephone Encounter (Signed)
 Patient previous appointment mammogram was canceled/ appointment rescheduled -Mammogram 12-20-2023 / Dexa 06/28/2024 @ 12:00. Voice mail not set up -unable to contact patient by phone. Appointment mailed to the patient including information 75.00 no show fee charge

## 2023-11-29 NOTE — Progress Notes (Signed)
 Internal Medicine Clinic Attending  Case discussed with the resident at the time of the visit.  We reviewed the resident's history and exam and pertinent patient test results.  I agree with the assessment, diagnosis, and plan of care documented in the resident's note.

## 2023-12-13 ENCOUNTER — Ambulatory Visit

## 2023-12-21 ENCOUNTER — Encounter: Payer: Self-pay | Admitting: Internal Medicine

## 2023-12-21 ENCOUNTER — Encounter: Admitting: Internal Medicine

## 2024-01-08 ENCOUNTER — Encounter: Payer: Self-pay | Admitting: *Deleted

## 2024-02-14 ENCOUNTER — Telehealth: Payer: Self-pay | Admitting: *Deleted

## 2024-02-14 NOTE — Telephone Encounter (Signed)
 Mammogram appointment August 19.2025 @ 9:00 am / arrive 8:45 am /  Appointment mailed to the  patient with the information regarding the $75.00 no show fee charge.

## 2024-03-12 ENCOUNTER — Ambulatory Visit

## 2024-03-28 ENCOUNTER — Telehealth: Payer: Self-pay | Admitting: *Deleted

## 2024-03-28 NOTE — Telephone Encounter (Signed)
 Appointment for mammogram canceled x 3 (52-2025, 12/13/2023, 03/12/2024 .) dexa scan appointment has also been canceled for 06/28/2024.

## 2024-04-25 ENCOUNTER — Ambulatory Visit

## 2024-04-25 ENCOUNTER — Other Ambulatory Visit: Payer: Self-pay

## 2024-04-25 VITALS — BP 111/65 | HR 49 | Temp 98.0°F | Ht 66.0 in | Wt 128.8 lb

## 2024-04-25 DIAGNOSIS — J309 Allergic rhinitis, unspecified: Secondary | ICD-10-CM

## 2024-04-25 DIAGNOSIS — M19022 Primary osteoarthritis, left elbow: Secondary | ICD-10-CM

## 2024-04-25 DIAGNOSIS — E782 Mixed hyperlipidemia: Secondary | ICD-10-CM | POA: Diagnosis not present

## 2024-04-25 DIAGNOSIS — D72819 Decreased white blood cell count, unspecified: Secondary | ICD-10-CM

## 2024-04-25 DIAGNOSIS — Z0000 Encounter for general adult medical examination without abnormal findings: Secondary | ICD-10-CM | POA: Diagnosis not present

## 2024-04-25 DIAGNOSIS — Z23 Encounter for immunization: Secondary | ICD-10-CM

## 2024-04-25 MED ORDER — DICLOFENAC SODIUM 50 MG PO TBEC: 50.0000 mg | DELAYED_RELEASE_TABLET | Freq: Two times a day (BID) | ORAL | 0 refills | Status: AC

## 2024-04-25 NOTE — Assessment & Plan Note (Signed)
 The patient was reporting difficulty with her allergy symptoms today, stating that she frequently feels congested and has postnasal drip.  She has tried Flonase  in the past however she felt as if she was choking and gagging on the sensation of it running down the back of her throat.  She has tried Claritin , Zyrtec , and Allegra over-the-counter with reported minimal relief.  She did state that Mucinex  helped her.  At this time instructed her to continue Mucinex  as needed for allergies.

## 2024-04-25 NOTE — Progress Notes (Signed)
 Internal Medicine Clinic Attending  I was physically present during the key portions of the resident provided service and participated in the medical decision making of patient's management care. I reviewed pertinent patient test results.  The assessment, diagnosis, and plan were formulated together and I agree with the documentation in the resident's note.  Jeanelle Layman CROME, MD

## 2024-04-25 NOTE — Patient Instructions (Signed)
 Thank you, Ms.Consuelo JULIANNA Kays, for allowing us  to provide your care today. Today we discussed . . .  > Shoulder pain       - I have prescribed Diclofenac  50mg --take 1 tablet every twelve hours as needed for pain.        -Make sure to schedule an appointment for a shoulder injection > Allergies       - You may keep taking mucinex  as needed for allergies > Medication refill       - All of your medications have refills available at the [facility].        - Ask them to set up automatic refills for you.    I have ordered the following labs for you:  Lab Orders         CBC no Diff         Basic metabolic panel with GFR         Lipid Profile        Follow up: 3 months    Remember:  Should you have any questions or concerns please call the internal medicine clinic at [phone].     Schuyler Novak, DO [facility]

## 2024-04-25 NOTE — Assessment & Plan Note (Signed)
 The patient does have a history of leukopenia seen incidentally on a CBC from February 2024.  At this time it was thought to be a lab error however the patient did not return to the clinic for recollection.  Will order CBC at this time. Orders:   CBC no Diff

## 2024-04-25 NOTE — Assessment & Plan Note (Signed)
 The patient is here for routine follow-up however is due for screening exams including BMP, CBC, and lipid profile.  We also discussed need for screening mammogram and she is due for this and shingles vaccine.  She has agreed to both and was directed on how to set up her mammogram and sent to the pharmacy for a shingles vaccine. Orders:   Basic metabolic panel with GFR   CBC no Diff   Lipid Profile

## 2024-04-25 NOTE — Progress Notes (Signed)
 Established Patient Office Visit  Subjective   Patient ID: KALENNA MILLETT, female    DOB: [birth year]  Age: 68 y.o. MRN: [id number]  Chief Complaint  Patient presents with   Follow-up    Routine office visit with medication refill / request flu shot / chronic shoulder pain-request shoulder injection   Ms. Bubar is a 68 year old female with past medical history of GERD, major depressive disorder, hyperlipidemia, tobacco use, and osteoarthritis here for routine follow-up.    Review of Systems  Constitutional:  Negative for chills and fever.  Eyes:  Negative for blurred vision and double vision.  Respiratory:  Negative for cough, shortness of breath and wheezing.   Cardiovascular:  Negative for chest pain, palpitations and leg swelling.  Gastrointestinal:  Negative for abdominal pain, nausea and vomiting.  Genitourinary:  Negative for dysuria, frequency and urgency.  Musculoskeletal:  Positive for joint pain. Negative for myalgias and neck pain.  Neurological:  Negative for dizziness, tingling, sensory change and headaches.      Objective:     BP 111/65 (BP Location: Left Arm, Patient Position: Sitting, Cuff Size: Normal)   Pulse (!) 49   Temp 98 F (36.7 C) (Oral)   Ht 5' 6 (1.676 m)   Wt 128 lb 12.8 oz (58.4 kg)   LMP 11/08/1991   SpO2 91%   BMI 20.79 kg/m     Const: Awake, alert in NAD HENT: Normocephalic, atraumatic, mucus membranes moist Card: RRR, No MRG, No pitting edema on LE's bilaterally  Resp: LCTAB, no increased work of breathing Abd: Soft, NTND  Extremities: Warm, pink MSK: Pain with both AROM and PROM of the left shoulder.    The 10-year ASCVD risk score (Arnett DK, et al., 2019) is: 14.1%    Assessment & Plan:   Assessment & Plan Healthcare maintenance The patient is here for routine follow-up however is due for screening exams including BMP, CBC, and lipid profile.  We also discussed need for screening mammogram and she is due for this and  shingles vaccine.  She has agreed to both and was directed on how to set up her mammogram and sent to the pharmacy for a shingles vaccine. Orders:   Basic metabolic panel with GFR   CBC no Diff   Lipid Profile  Mixed hyperlipidemia The patient does have a history of hyperlipidemia however has been off of her Crestor  due to it not being filled at the pharmacy.  Her last lipid profile was in February 2024.  Will obtain lipid panel today.  Instructed patient that she does have refills on her rosuvastatin  and would need to call the pharmacy to set this up.  Also discussed how to turn on automatic refills to assist with medication adherence. Orders:   Lipid Profile  Encounter for immunization  Orders:   Flu vaccine HIGH DOSE PF(Fluzone Trivalent)  Osteoarthritis involving joint of left upper arm The patient presented today with significant pain in the left shoulder.  She reports that she has a history of osteoarthritis and has received steroid injections into the joint in the past with significant relief.  She would like another steroid injection however we are unable to do this today and she will need to schedule another appointment.  Discussed pain management with her including topical Voltaren  gel to be used daily for maximal effect.  As her renal function is stable, will prescribe a short course of diclofenac  until she is able to receive her steroid injection. Orders:   diclofenac  (  VOLTAREN ) 50 MG EC tablet; Take 1 tablet (50 mg total) by mouth 2 (two) times daily for 10 days.  Leukopenia, unspecified type The patient does have a history of leukopenia seen incidentally on a CBC from February 2024.  At this time it was thought to be a lab error however the patient did not return to the clinic for recollection.  Will order CBC at this time. Orders:   CBC no Diff  Allergic rhinitis, unspecified seasonality, unspecified trigger The patient was reporting difficulty with her allergy symptoms  today, stating that she frequently feels congested and has postnasal drip.  She has tried Flonase  in the past however she felt as if she was choking and gagging on the sensation of it running down the back of her throat.  She has tried Claritin , Zyrtec , and Allegra over-the-counter with reported minimal relief.  She did state that Mucinex  helped her.  At this time instructed her to continue Mucinex  as needed for allergies.       Schuyler Novak, DO

## 2024-04-25 NOTE — Assessment & Plan Note (Signed)
 The patient does have a history of hyperlipidemia however has been off of her Crestor  due to it not being filled at the pharmacy.  Her last lipid profile was in February 2024.  Will obtain lipid panel today.  Instructed patient that she does have refills on her rosuvastatin  and would need to call the pharmacy to set this up.  Also discussed how to turn on automatic refills to assist with medication adherence. Orders:   Lipid Profile

## 2024-04-26 ENCOUNTER — Telehealth: Payer: Self-pay | Admitting: *Deleted

## 2024-04-26 ENCOUNTER — Ambulatory Visit: Payer: Self-pay

## 2024-04-26 DIAGNOSIS — M545 Low back pain, unspecified: Secondary | ICD-10-CM

## 2024-04-26 LAB — CBC
Hematocrit: 44.2 % (ref 34.0–46.6)
Hemoglobin: 14.7 g/dL (ref 11.1–15.9)
MCH: 30.9 pg (ref 26.6–33.0)
MCHC: 33.3 g/dL (ref 31.5–35.7)
MCV: 93 fL (ref 79–97)
Platelets: 255 x10E3/uL (ref 150–450)
RBC: 4.76 x10E6/uL (ref 3.77–5.28)
RDW: 13 % (ref 11.7–15.4)
WBC: 3.8 x10E3/uL (ref 3.4–10.8)

## 2024-04-26 LAB — LIPID PANEL
Chol/HDL Ratio: 3.1 ratio (ref 0.0–4.4)
Cholesterol, Total: 258 mg/dL — ABNORMAL HIGH (ref 100–199)
HDL: 83 mg/dL (ref >39)
LDL Chol Calc (NIH): 161 mg/dL — ABNORMAL HIGH (ref 0–99)
Triglycerides: 84 mg/dL (ref 0–149)
VLDL Cholesterol Cal: 14 mg/dL (ref 5–40)

## 2024-04-26 LAB — BASIC METABOLIC PANEL WITH GFR
BUN/Creatinine Ratio: 13 (ref 12–28)
BUN: 14 mg/dL (ref 8–27)
CO2: 23 mmol/L (ref 20–29)
Calcium: 9.6 mg/dL (ref 8.7–10.3)
Chloride: 101 mmol/L (ref 96–106)
Creatinine, Ser: 1.11 mg/dL — ABNORMAL HIGH (ref 0.57–1.00)
Glucose: 85 mg/dL (ref 70–99)
Potassium: 4.7 mmol/L (ref 3.5–5.2)
Sodium: 140 mmol/L (ref 134–144)
eGFR: 54 mL/min/1.73 — ABNORMAL LOW (ref >59)

## 2024-04-26 NOTE — Progress Notes (Signed)
 BMP with elevated creatinine at 1.11. This is the first documentation of renal impairment. Will need repeat BMP in three months to trend and establish possible new baseline.

## 2024-04-26 NOTE — Telephone Encounter (Signed)
 Called pt x 2 for more information on which medications; no answer Call cannot be completed.

## 2024-04-26 NOTE — Telephone Encounter (Signed)
 Copied from CRM [id number]. Topic: Clinical - Prescription Issue >> Apr 26, 2024 10:53 AM Graeme ORN wrote: Reason for CRM: Patient called. States provider told her she she still have refills on medication. She reached out to Pharmacy. They were unable to locate refills or fill medication. She is out of medications. Did not have the names. Says provider would know. Confirmed pharmacy was correct. Thank You    ----------------------------------------------------------------------- From previous Reason for Contact - Med. Prior Auth: Reason for CRM:

## 2024-05-07 ENCOUNTER — Ambulatory Visit (INDEPENDENT_AMBULATORY_CARE_PROVIDER_SITE_OTHER)

## 2024-05-07 ENCOUNTER — Telehealth: Payer: Self-pay | Admitting: *Deleted

## 2024-05-07 VITALS — BP 111/73 | HR 56 | Temp 98.1°F | Ht 66.0 in | Wt 129.6 lb

## 2024-05-07 DIAGNOSIS — F17210 Nicotine dependence, cigarettes, uncomplicated: Secondary | ICD-10-CM

## 2024-05-07 DIAGNOSIS — F331 Major depressive disorder, recurrent, moderate: Secondary | ICD-10-CM

## 2024-05-07 DIAGNOSIS — Z79899 Other long term (current) drug therapy: Secondary | ICD-10-CM | POA: Diagnosis not present

## 2024-05-07 DIAGNOSIS — M25512 Pain in left shoulder: Secondary | ICD-10-CM

## 2024-05-07 DIAGNOSIS — G8929 Other chronic pain: Secondary | ICD-10-CM

## 2024-05-07 MED ORDER — TRIAMCINOLONE ACETONIDE 40 MG/ML IJ SUSP
40.0000 mg | Freq: Once | INTRAMUSCULAR | Status: AC
  Administered 2024-05-07: 40 mg via INTRA_ARTICULAR

## 2024-05-07 MED ORDER — LIDOCAINE HCL (PF) 1 % IJ SOLN
2.0000 mL | Freq: Once | INTRAMUSCULAR | Status: AC
  Administered 2024-05-07: 2 mL

## 2024-05-07 NOTE — Progress Notes (Signed)
   Acute Office Visit  Subjective:     Patient ID: [name], female    DOB: [birth year], 68 y.o.   MRN: [id number]  Chief Complaint  Patient presents with   Follow-up    Follow up from last office visit for left shoulder joint injection    [name] is a 68 year old female with a past medical history of GERD, major depressive disorder, hyperlipidemia, tobacco use, and osteoarthritis here for an acute visit regarding left shoulder blade and shoulder injection.   Review of Systems  Constitutional:  Negative for chills and fever.  Cardiovascular:  Negative for chest pain.  Gastrointestinal:  Negative for abdominal pain, heartburn and vomiting.  Musculoskeletal:  Positive for joint pain.  Neurological:  Negative for dizziness, tingling, sensory change and headaches.        Objective:    BP 111/73 (BP Location: Left Arm, Patient Position: Sitting, Cuff Size: Small)   Pulse (!) 56   Temp 98.1 F (36.7 C) (Oral)   Ht 5' 6 (1.676 m)   Wt 129 lb 9.6 oz (58.8 kg)   LMP 11/08/1991   SpO2 96%   BMI 20.92 kg/m    Const: Awake, alert in NAD HENT: Normocephalic, atraumatic, mucus membranes moist Eyes: PERRL Card: RRR, No MRG, No pitting edema on LE's bilaterally  Resp: LCTAB, no increased work of breathing Abd: Soft, NTND, Bsx4 Extremities: Warm, pain with active and passive ROM Neuro: CN II-XII intact, muscle strength 5/5 in all major muscle groups, sensation intact in all dermatomes, gait normal.    No results found for any visits on 05/07/24.      Assessment & Plan:   Assessment & Plan Chronic left shoulder pain  Shoulder Injection Procedure Note  Pre-operative Diagnosis: left osteoarthritis  Indications: Symptom relief from osteoarthritis  Anesthesia: Lidocaine  1% without epinephrine without added sodium bicarbonate  Procedure Details   Point of care ultrasound was used to identify the subacromial bursa and evaluate for rotator cuff tendinopathy or tears.  Consent was obtained for the procedure. The shoulder was prepped with iodine. Using a 22 gauge needle the subacromial bursa was injected with 2 mL 1% lidocaine  and 1 mL of triamcinolone  (KENALOG ) 40mg /ml. The needle was removed and a dressing was applied.  Complications:  None; patient tolerated the procedure well.     Moderate episode of recurrent major depressive disorder (HCC) A PHQ-9 was performed today with a score of 19.  The patient did report that she has had suicidal thoughts within the past month however has no plan to act on them.  She has not having active suicidal thoughts today and has no plan to hurt herself in any way.  She does have a remote history of a suicidal attempt.  At this time she does not meet criteria for IVC hold however I am concerned for the severity of her depression.  She is adherent to her bupropion .  She reports that she is following with a counselor and meets with them regularly.  We did go over resources for emergent psychiatric help before she left the office today.      Schuyler Novak, DO

## 2024-05-07 NOTE — Telephone Encounter (Signed)
 Patient in office today/mammogram appt sheet given to patient to call and make her appointment (in hand) attached information regarding the 75.00 NO SHOW fee .

## 2024-05-07 NOTE — Assessment & Plan Note (Signed)
 A PHQ-9 was performed today with a score of 19.  The patient did report that she has had suicidal thoughts within the past month however has no plan to act on them.  She has not having active suicidal thoughts today and has no plan to hurt herself in any way.  She does have a remote history of a suicidal attempt.  At this time she does not meet criteria for IVC hold however I am concerned for the severity of her depression.  She is adherent to her bupropion .  She reports that she is following with a counselor and meets with them regularly.  We did go over resources for emergent psychiatric help before she left the office today.

## 2024-05-07 NOTE — Patient Instructions (Signed)
  [facility] - preferred! URGENT CARE SERVICES - WALK IN Phone: [phone]  Address: [address] [place], [place] [address]  Hours:Open 24/7, No appointment required.  URGENT CARE SERVICES Assessment:  mental health evaluation, assessing immediate safety concerns and further mental health needs.  Referral: Resources, Connect to community-based mental health treatment, when indicated, including psychotherapy, psychiatry and other specialized behavioral health or substance use disorder services (for those not already in treatment).  Transitional care: In-person assessment and/or virtual follow up during the patient's transition to connect them with the appropriate outpatient services.  OUTPATIENT SERVICES  Individual Therapy Partial Hospitalization Program (PHP) Substance Abuse Intensive Outpatient Program [facility]) Specialized Intensive Adult Group Therapy Medication Management Peer Living Room

## 2024-05-08 NOTE — Progress Notes (Signed)
 Internal Medicine Clinic Attending  I was physically present during the key portions of the resident provided service and participated in the medical decision making of patient's management care. I reviewed pertinent patient test results.  The assessment, diagnosis, and plan were formulated together and I agree with the documentation in the resident's note. I was present for shoulder injection Rosan Dayton BROCKS, DO

## 2024-06-28 ENCOUNTER — Other Ambulatory Visit
